# Patient Record
Sex: Female | Born: 1940
Health system: Southern US, Community
[De-identification: ages and names within clinical notes are randomized; demographics above are authoritative.]

## PROBLEM LIST (undated history)

## (undated) DIAGNOSIS — C50919 Malignant neoplasm of unspecified site of unspecified female breast: Secondary | ICD-10-CM

## (undated) DIAGNOSIS — E079 Disorder of thyroid, unspecified: Secondary | ICD-10-CM

## (undated) DIAGNOSIS — D0510 Intraductal carcinoma in situ of unspecified breast: Principal | ICD-10-CM

## (undated) DIAGNOSIS — M109 Gout, unspecified: Secondary | ICD-10-CM

## (undated) DIAGNOSIS — N814 Uterovaginal prolapse, unspecified: Secondary | ICD-10-CM

## (undated) DIAGNOSIS — K219 Gastro-esophageal reflux disease without esophagitis: Secondary | ICD-10-CM

## (undated) DIAGNOSIS — I10 Essential (primary) hypertension: Secondary | ICD-10-CM

## (undated) DIAGNOSIS — Z9889 Other specified postprocedural states: Secondary | ICD-10-CM

## (undated) DIAGNOSIS — R011 Cardiac murmur, unspecified: Secondary | ICD-10-CM

## (undated) DIAGNOSIS — J45909 Unspecified asthma, uncomplicated: Secondary | ICD-10-CM

## (undated) DIAGNOSIS — N189 Chronic kidney disease, unspecified: Secondary | ICD-10-CM

## (undated) DIAGNOSIS — R112 Nausea with vomiting, unspecified: Secondary | ICD-10-CM

## (undated) HISTORY — DX: Malignant neoplasm of unspecified site of unspecified female breast: C50.919

## (undated) HISTORY — PX: WRIST SURGERY: SHX841

## (undated) HISTORY — DX: Intraductal carcinoma in situ of unspecified breast: D05.10

## (undated) HISTORY — PX: KNEE SURGERY: SHX244

## (undated) HISTORY — DX: Essential (primary) hypertension: I10

## (undated) HISTORY — DX: Chronic kidney disease, unspecified: N18.9

## (undated) HISTORY — DX: Disorder of thyroid, unspecified: E07.9

## (undated) HISTORY — DX: Gastro-esophageal reflux disease without esophagitis: K21.9

## (undated) HISTORY — DX: Uterovaginal prolapse, unspecified: N81.4

## (undated) HISTORY — PX: HIP SURGERY: SHX245

## (undated) HISTORY — DX: Gout, unspecified: M10.9

## (undated) HISTORY — DX: Cardiac murmur, unspecified: R01.1

---

## 2000-12-31 ENCOUNTER — Encounter: Payer: Self-pay | Admitting: Orthopedic Surgery

## 2001-01-02 ENCOUNTER — Inpatient Hospital Stay (HOSPITAL_COMMUNITY): Admission: EM | Admit: 2001-01-02 | Discharge: 2001-01-03 | Payer: Self-pay | Admitting: Orthopedic Surgery

## 2003-07-13 ENCOUNTER — Other Ambulatory Visit: Admission: RE | Admit: 2003-07-13 | Discharge: 2003-07-13 | Payer: Self-pay | Admitting: Family Medicine

## 2004-01-19 ENCOUNTER — Emergency Department (HOSPITAL_COMMUNITY): Admission: EM | Admit: 2004-01-19 | Discharge: 2004-01-20 | Payer: Self-pay | Admitting: *Deleted

## 2004-01-20 ENCOUNTER — Ambulatory Visit (HOSPITAL_COMMUNITY): Admission: RE | Admit: 2004-01-20 | Discharge: 2004-01-20 | Payer: Self-pay | Admitting: Family Medicine

## 2004-10-12 ENCOUNTER — Other Ambulatory Visit: Admission: RE | Admit: 2004-10-12 | Discharge: 2004-10-12 | Payer: Self-pay | Admitting: Obstetrics and Gynecology

## 2010-05-31 ENCOUNTER — Encounter: Admission: RE | Admit: 2010-05-31 | Discharge: 2010-05-31 | Payer: Self-pay | Admitting: Obstetrics and Gynecology

## 2010-06-12 ENCOUNTER — Encounter: Admission: RE | Admit: 2010-06-12 | Discharge: 2010-06-12 | Payer: Self-pay | Admitting: Obstetrics and Gynecology

## 2010-06-20 ENCOUNTER — Encounter: Admission: RE | Admit: 2010-06-20 | Discharge: 2010-06-20 | Payer: Self-pay | Admitting: Obstetrics and Gynecology

## 2010-06-23 ENCOUNTER — Encounter: Admission: RE | Admit: 2010-06-23 | Discharge: 2010-06-23 | Payer: Self-pay | Admitting: Obstetrics and Gynecology

## 2010-06-30 ENCOUNTER — Ambulatory Visit (HOSPITAL_BASED_OUTPATIENT_CLINIC_OR_DEPARTMENT_OTHER): Payer: MEDICARE | Admitting: Genetic Counselor

## 2010-07-19 ENCOUNTER — Encounter (INDEPENDENT_AMBULATORY_CARE_PROVIDER_SITE_OTHER): Payer: Self-pay | Admitting: General Surgery

## 2010-07-19 ENCOUNTER — Ambulatory Visit (HOSPITAL_COMMUNITY)
Admission: RE | Admit: 2010-07-19 | Discharge: 2010-07-21 | Payer: Self-pay | Source: Home / Self Care | Attending: General Surgery | Admitting: General Surgery

## 2010-07-19 HISTORY — PX: OTHER SURGICAL HISTORY: SHX169

## 2010-08-28 ENCOUNTER — Encounter: Payer: MEDICARE | Admitting: Oncology

## 2010-08-28 DIAGNOSIS — D0590 Unspecified type of carcinoma in situ of unspecified breast: Secondary | ICD-10-CM

## 2010-10-02 LAB — DIFFERENTIAL
Basophils Absolute: 0 10*3/uL (ref 0.0–0.1)
Basophils Relative: 1 % (ref 0–1)
Eosinophils Absolute: 0.1 10*3/uL (ref 0.0–0.7)
Monocytes Absolute: 0.6 10*3/uL (ref 0.1–1.0)
Neutro Abs: 2.6 10*3/uL (ref 1.7–7.7)
Neutrophils Relative %: 46 % (ref 43–77)

## 2010-10-02 LAB — COMPREHENSIVE METABOLIC PANEL
ALT: 21 U/L (ref 0–35)
Albumin: 4.2 g/dL (ref 3.5–5.2)
Alkaline Phosphatase: 103 U/L (ref 39–117)
BUN: 21 mg/dL (ref 6–23)
Chloride: 108 mEq/L (ref 96–112)
Glucose, Bld: 89 mg/dL (ref 70–99)
Potassium: 4.8 mEq/L (ref 3.5–5.1)
Sodium: 142 mEq/L (ref 135–145)
Total Bilirubin: 0.8 mg/dL (ref 0.3–1.2)

## 2010-10-02 LAB — CBC
HCT: 34.9 % — ABNORMAL LOW (ref 36.0–46.0)
MCH: 29.5 pg (ref 26.0–34.0)
MCHC: 32.2 g/dL (ref 30.0–36.0)
MCV: 89.9 fL (ref 78.0–100.0)
MCV: 91.6 fL (ref 78.0–100.0)
Platelets: 212 10*3/uL (ref 150–400)
RBC: 2.98 MIL/uL — ABNORMAL LOW (ref 3.87–5.11)
RBC: 3.88 MIL/uL (ref 3.87–5.11)
RDW: 13.9 % (ref 11.5–15.5)
WBC: 5.7 10*3/uL (ref 4.0–10.5)

## 2010-10-02 LAB — BASIC METABOLIC PANEL
BUN: 20 mg/dL (ref 6–23)
CO2: 26 mEq/L (ref 19–32)
Calcium: 8.1 mg/dL — ABNORMAL LOW (ref 8.4–10.5)
Chloride: 106 mEq/L (ref 96–112)
Creatinine, Ser: 0.99 mg/dL (ref 0.4–1.2)
GFR calc Af Amer: >60 mL/min (ref >60)

## 2010-10-02 LAB — GLUCOSE, CAPILLARY: Glucose-Capillary: 97 mg/dL (ref 70–99)

## 2010-12-08 NOTE — Discharge Summary (Signed)
[facility]  Patient:    Christie Bruce, Christie Bruce                        MRN: [id number] Adm. Date:  [id number] Disc. Date: 01/03/01 Attending:  Dominica Severin Dictator:   Irena Cords, P.A.-C.                           Discharge Summary  PRIMARY DIAGNOSES:  Left radius fracture.  SECONDARY DIAGNOSES:  Urinary retention.  PROCEDURE:  Open reduction internal fixation left radius fracture with plate and screws by Dr. Amanda Pea with the assistance of [facility], P.A.-C. and Dorie Rank, P.A. on December 31, 2000.  Please see operative summary for further details.  CONSULTATIONS:  None.  LABORATORIES:  Preoperative:  Comprehensive metabolic panel was essentially all within normal limits with just a mild elevation of her total bilirubin to 1.3.  PT was 12.6, INR 1.0, PTT 28.  CBC was all within normal limits with just a mildly decreased hematocrit of 35.9.  White count was 5.0 and hemoglobin was 12.0 with platelet count 261,000.  Normal differential.  EKG showed normal sinus rhythm but it could not rule out anterior infarct. This was an unconfirmed EKG.  CHIEF COMPLAINT:  Left wrist pain.  HISTORY OF PRESENT ILLNESS:  Christie Bruce is a 70 year old female who sustained an injury to her left wrist.  Due to the position and nature of the fracture it was recommended she undergo ORIF of that wrist.  She was admitted on June 11 for surgical intervention.  HOSPITAL COURSE:  Following the surgical procedure patient was taken to the PACU in stable condition, transferred to the orthopedic floor in good condition.  She did well during her hospital stay.  The splint remained clean, dry, and intact throughout her hospital stay.  She had intact motor and sensory examinations and good movement of her fingers.  A capillary refill was less than 2 seconds at all times.  Occupational therapy was consulted for ADLs.  Patient received three doses of Ancef 1 g IV postoperative.  She  was on PCA morphine initially for pain management and then p.o. Percocet which controlled her pain well.  Drain was discontinued on postoperative day #1 without difficulty.  She did have problems with urinary retention on the first postoperative day.  Because of this her discharge was held.  Patient was started on Ditropan and a Foley was initially placed.  On postoperative day #2 she again had problems with urinary retention.  She was started on Urecholine for the retention.  Discharge was held that day as well because of continued urinary retention.  In the night of postoperative day #2 she began to urinate on her own after taking the Urecholine and by postoperative day #3 was urinating without difficulties.  She had no dysuria at all.  She was able to void without problems and was stable and ready for discharge home on postoperative day #3.  PLAN:  She will be discharged home.  She is to follow-up with Dr. Amanda Pea in 10 days.  She is to call [phone] for an appointment.  She is to resume a regular diet.  It is okay and I encouraged her to move her fingers.  No lifting, pushing, or pulling with that arm.  Instructed on ice and elevation.  Use the sling on the left arm.  DISCHARGE MEDICATIONS: 1. Percocet 5/325 one to two p.o. q.4-6h.  p.r.n. pain. 2. Robaxin 500 mg p.o. q.8h. p.r.n. spasm.  CONDITION ON DISCHARGE:  Good and improved. DD:  01/03/01 TD:  01/03/01 Job: [id number]

## 2010-12-08 NOTE — Op Note (Signed)
[facility]  Patient:    Christie Bruce, Christie Bruce                        MRN: [id number] Proc. Date: 12/31/00 Adm. Date:  [id number] Disc. Date: [id number] Attending:  Dominica Severin                           Operative Report  PREOPERATIVE DIAGNOSIS:  Left comminuted displaced radius fracture.  POSTOPERATIVE DIAGNOSIS:  Left comminuted displaced radius fracture.  PROCEDURE:  Open reduction, internal fixation, left radius fracture with volar plate and screws of a fixed-angled hand innovations volar plate construct.  SURGEON:  Elisha Ponder, M.D.  ASSISTANTJill Side P. Mahar, P.A.-C. and Dorie Rank, P.A.-C.  ANESTHESIA:  General.  ESTIMATED BLOOD LOSS:  Less than 100 cc.  COMPLICATIONS:  None.  DRAINS:  One.  INDICATIONS FOR PROCEDURE:  This patient is a very pleasant 70 year old white female, who presents with a left displaced distal radius fracture.  The patient has been counseled in regards to risks and benefits of surgery including risk of infection, bleeding, anesthesia, damage to normal structures, and the failure of the surgery to accomplish its intended goals of of relieving symptoms and restoring function.  With this in mind, she desires to proceed.  All questions have been encouraged and answered preoperatively.  OPERATIVE FINDINGS:  This patient had a displaced distal radius fracture. Reduction and fixation was obtained without difficulty.  I was very pleased with her motion after fixation and the secure fixation obtained at the time of the operation.  OPERATION IN DETAIL:  The patient was seen by myself and anesthesia.  She was taken to the operative suite and underwent a smooth induction of general anesthetic.  Once this was done, the left upper extremity was prepped and draped in the usual sterile fashion.  Following prepping and draping of the left upper extremity, the arm was elevated, the tourniquet was insufflated to 250 mmHg.  An  incision was made on the volar aspect of the wrist.  Dissection was carried down through skin with knife blade.  Following this, blunt and sharp dissection was carried down to the FCR tendon sheath which was split dorsally and palmarly.  The FCR was retracted in a radial direction.  The flexor contents were retracted ulnarly.  The radial artery was protected at all times.  Once this was done, the pronator quadratus was identified and elevated off of the radius in a radial to ulnar direction.  This exposed the fracture site.  The fracture site was reduced and preliminarily stabilized with Kirschner wire.  Following this, the patient had x-rays taken and adjustments made appropriately and following this, application of a fixed-angle hand innovation volar plate was applied.  Adjustments were made to correct for radial inclination, volar tilt, and radial height.  I was pleased with the ______  of bone and radiographic parameters.  Once this was done, the patient then had all screws applied without difficulty utilizing standard AO technique.  Once all screws were placed, provisional fixation was removed. The patient was taken through a range of motion and noted to be stable and without any significant metaphyseal void that would require a large amount of bone grafting.  Thus, at this point in time, the tourniquet was deflated, hemostasis obtained.  The pronator quadratus was reattached with interrupted Vicryl sutures.  Intraoperative x-rays were taken as well as fluoroscopy and  saved for permanent documentation.  Following closure of the pronator quadratus, a small TLO string was placed followed by closure of the wound with interrupted Prolene suture.  She tolerated this well without difficulty.  The patient had a sterile dressing applied and a sugar-tong splint placed.  She tolerated this well.  She was awakened from anesthesia in the operative suite and transferred to the recovery room in  stable condition.  All sponge, needle, and instrument counts were reported as correct, and there were no immediate intraoperative complications.  The patient will be monitored with IV antibiotics, pain medicine appropriate to her needs, IV fluids, management, etc.  The patient was monitored in the recovery room and was noted to be awake, alert, oriented, and in stable condition without neurovascular compromise.  We will look forward to participating in her care and her postop recovery. DD:  02/01/01 TD:  02/01/01 Job: [id number]

## 2010-12-08 NOTE — H&P (Signed)
[facility]  Patient:    Christie Bruce, Christie Bruce                        MRN: [id number] Adm. Date:  [id number] Disc. Date: [id number] Attending:  Dominica Severin Dictator:   Dorie Rank, P.A.                         History and Physical  CHIEF COMPLAINT:  Left arm pain.  HISTORY OF PRESENT ILLNESS:  Ms. Callins is a 70 year old female who sustained an injury to her left wrist.  Due to the position and nature of the fracture it was recommended she undergo ORIF of that wrist.  PAST MEDICAL HISTORY:  History of benign breast lump in the distant past.  Has a history of a hiatal hernia.  Previous history of kidney stones.  Arthritis. She wears glasses.  She has a previous history of skin cancer with no sequela.  PAST SURGICAL HISTORY:  Removal of benign breast lump (unsure of the date).  MEDICATIONS:  Advil two p.o. q.d. p.r.n.  ALLERGIES:  CELEBREX causes edema to the lips, DAYPRO causes edema to the lips.  SOCIAL HISTORY:  The patient is married.  She lives in a one-level home with her husband.  She is a nondrinker.  She smokes about a half a pack per day.  FAMILY HISTORY:  Noncontributory.  REVIEW OF SYSTEMS:  GENERAL:  No fevers, chills, night sweats, or bleeding tendencies.  CNS:  No blurred or double vision, seizures, headache, or paralysis.  RESPIRATORY:  No shortness of breath, productive cough, or hemoptysis.  CARDIOVASCULAR:  No chest pain, angina, or orthopnea. GASTROINTESTINAL:  No nausea, vomiting, diarrhea, constipation, melena, or blood stools.  GENITOURINARY:  No dysuria, hematuria, discharge. MUSCULOSKELETAL:  Left wrist pain as described above in the history of present illness.  PHYSICAL EXAMINATION  GENERAL:  A 70 year old well-developed, well-nourished female.  Alert and oriented x 3.  VITAL SIGNS:  Temperature 98.3, blood pressure 177/90, pulse 80, respirations 20.  NECK:  Supple.  Negative for carotid bruits bilaterally.  HEENT:  Head  is atraumatic, normocephalic.  Oropharynx is clear.  HEART:  S1, S2.  Negative for murmur, rub, or gallop.  Heart is regular in rhythm.  LUNGS:  Clear to auscultation bilaterally.  No wheezes, rhonchi, or rales.  ABDOMEN:  Soft, nontender, positive bowel sounds.  GENITOURINARY:  Not pertinent to present illness.  BREASTS:  Not pertinent to present illness.  EXTREMITIES:  Left arm is currently in a cast.  Capillary refill is less than two seconds.  Sensation and motor intact on the left hand.  Pain with movement.  NEUROLOGIC:  EOMs intact.  PERRLA.  IMPRESSION: 1. Left wrist fracture. 2. History of kidney stones. 3. History of gastroesophageal reflux disease. 4. Osteoarthritis.  PLAN:  The patient will be admitted to [facility] to undergo open reduction internal fixation of the left wrist by Dr. Onalee Hua. DD:  01/21/01 TD:  01/21/01 Job: [id number]

## 2011-08-11 ENCOUNTER — Telehealth: Payer: Self-pay | Admitting: Oncology

## 2011-08-11 NOTE — Telephone Encounter (Signed)
spoke with pt and she is aware of 2/20 appt  aom/conversion issue

## 2011-09-12 ENCOUNTER — Ambulatory Visit (HOSPITAL_BASED_OUTPATIENT_CLINIC_OR_DEPARTMENT_OTHER): Payer: Medicare Other | Admitting: Oncology

## 2011-09-12 ENCOUNTER — Other Ambulatory Visit: Payer: Medicare Other | Admitting: Lab

## 2011-09-12 ENCOUNTER — Encounter: Payer: Self-pay | Admitting: Oncology

## 2011-09-12 ENCOUNTER — Telehealth: Payer: Self-pay | Admitting: *Deleted

## 2011-09-12 DIAGNOSIS — D0590 Unspecified type of carcinoma in situ of unspecified breast: Secondary | ICD-10-CM

## 2011-09-12 DIAGNOSIS — K219 Gastro-esophageal reflux disease without esophagitis: Secondary | ICD-10-CM

## 2011-09-12 DIAGNOSIS — D0510 Intraductal carcinoma in situ of unspecified breast: Secondary | ICD-10-CM

## 2011-09-12 HISTORY — DX: Intraductal carcinoma in situ of unspecified breast: D05.10

## 2011-09-12 HISTORY — DX: Gastro-esophageal reflux disease without esophagitis: K21.9

## 2011-09-12 LAB — COMPREHENSIVE METABOLIC PANEL
AST: 27 U/L (ref 0–37)
Albumin: 4.5 g/dL (ref 3.5–5.2)
Alkaline Phosphatase: 118 U/L — ABNORMAL HIGH (ref 39–117)
Glucose, Bld: 94 mg/dL (ref 70–99)
Potassium: 4.7 mEq/L (ref 3.5–5.3)
Sodium: 143 mEq/L (ref 135–145)
Total Bilirubin: 0.6 mg/dL (ref 0.3–1.2)
Total Protein: 7.1 g/dL (ref 6.0–8.3)

## 2011-09-12 LAB — CBC WITH DIFFERENTIAL/PLATELET
BASO%: 0.6 % (ref 0.0–2.0)
EOS%: 2.9 % (ref 0.0–7.0)
Eosinophils Absolute: 0.2 10*3/uL (ref 0.0–0.5)
LYMPH%: 36.2 % (ref 14.0–49.7)
MCH: 29.4 pg (ref 25.1–34.0)
MCHC: 33.7 g/dL (ref 31.5–36.0)
MCV: 87.2 fL (ref 79.5–101.0)
MONO%: 9.1 % (ref 0.0–14.0)
NEUT#: 2.8 10*3/uL (ref 1.5–6.5)
Platelets: 253 10*3/uL (ref 145–400)
RBC: 3.69 10*6/uL — ABNORMAL LOW (ref 3.70–5.45)
RDW: 13.8 % (ref 11.2–14.5)

## 2011-09-12 NOTE — Telephone Encounter (Signed)
gave patient appointment for one year with nancy rudloph in one year for an hour of [facility]

## 2011-09-13 NOTE — Progress Notes (Signed)
OFFICE PROGRESS NOTE  CC Dr. Hinton Dyer Dr. 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  PRIOR THERAPY:  #1 patient underwent bilateral mastectomies. Final pathology showed ductal carcinoma in situ.   CURRENT THERAPY:Observation  INTERVAL HISTORY: Christie Bruce 71 y.o. female returns for Followup visit today. Overall she is doing well she is without any significant complaints. She is quite happy with her results. She has no evidence of recurrent disease.  MEDICAL HISTORY: Past Medical History  Diagnosis Date  . Breast cancer   . Hypertension   . GERD (gastroesophageal reflux disease)   . Uterine prolapse   . 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 GERD (gastroesophageal reflux disease) 09/12/2011    ALLERGIES:  is allergic to celebrex and sulfa antibiotics.  MEDICATIONS:  Current Outpatient Prescriptions  Medication Sig Dispense Refill  . ALPRAZolam (XANAX) 0.25 MG tablet Take 0.25 mg by mouth at bedtime as needed.      Marland Kitchen ibuprofen (ADVIL,MOTRIN) 200 MG tablet Take 200 mg by mouth every 6 (six) hours as needed.      Marland Kitchen lisinopril-hydrochlorothiazide (PRINZIDE,ZESTORETIC) 20-25 MG per tablet Take 1 tablet by mouth daily.        SURGICAL HISTORY:  Past Surgical History  Procedure Date  . Bilateral mastectomy 07/19/10    bilat mastectomy for DCIS    REVIEW OF SYSTEMS:  A comprehensive review of systems was negative.   PHYSICAL EXAMINATION: General appearance: alert, cooperative and appears stated age Lymph nodes: Cervical, supraclavicular, and axillary nodes normal. Resp: clear to auscultation bilaterally and normal percussion bilaterally Back: symmetric, no curvature. ROM normal. No CVA tenderness. Cardio: regular rate and rhythm, S1, S2 normal, no  murmur, click, rub or gallop GI: soft, non-tender; bowel sounds normal; no masses,  no organomegaly Extremities: extremities normal, atraumatic, no cyanosis or edema Neurologic: Grossly normal  ECOG PERFORMANCE STATUS: 1 - Symptomatic but completely ambulatory  Blood pressure 151/76, pulse 89, temperature 99.1 F (37.3 C), temperature source Oral, height 5\' 1"  (1.549 m), weight 160 lb 12.8 oz (72.938 kg).  LABORATORY DATA: Lab Results  Component Value Date   WBC 5.5 09/12/2011   HGB 10.8* 09/12/2011   HCT 32.2* 09/12/2011   MCV 87.2 09/12/2011   PLT 253 09/12/2011      Chemistry      Component Value Date/Time   NA 143 09/12/2011 1442   K 4.7 09/12/2011 1442   CL 109 09/12/2011 1442   CO2 25 09/12/2011 1442   BUN 24* 09/12/2011 1442   CREATININE 1.07 09/12/2011 1442      Component Value Date/Time   CALCIUM 9.2 09/12/2011 1442   ALKPHOS 118* 09/12/2011 1442   AST 27 09/12/2011 1442   ALT 18 09/12/2011 1442   BILITOT 0.6 09/12/2011 1442       RADIOGRAPHIC STUDIES:  No results found.  ASSESSMENT: 71 year old female with bilateral ductal carcinoma in situ status post bilateral simple mastectomies. She is without clinical evidence of recurrent disease. Overall she is doing well.   PLAN: Plan is to continue to follow the patient on a yearly basis for the first 5 years.   All questions were answered. The patient knows to call the clinic with any problems, questions or concerns. We can certainly see the patient much sooner if necessary.  I spent 20 minutes counseling the patient  face to face. The total time spent in the appointment was 25 minutes.    Drue Second, MD Medical/Oncology [facility] [phone] (beeper) [phone] (Office)  09/13/2011, 6:35 PM

## 2012-09-01 ENCOUNTER — Other Ambulatory Visit: Payer: Self-pay | Admitting: Obstetrics and Gynecology

## 2012-09-01 ENCOUNTER — Other Ambulatory Visit (HOSPITAL_COMMUNITY)
Admission: RE | Admit: 2012-09-01 | Discharge: 2012-09-01 | Disposition: A | Discharge status: Home / Self Care | Payer: Medicare Other | Source: Ambulatory Visit | Attending: Obstetrics and Gynecology | Admitting: Obstetrics and Gynecology

## 2012-09-01 DIAGNOSIS — Z1151 Encounter for screening for human papillomavirus (HPV): Secondary | ICD-10-CM | POA: Insufficient documentation

## 2012-09-01 DIAGNOSIS — Z01419 Encounter for gynecological examination (general) (routine) without abnormal findings: Secondary | ICD-10-CM | POA: Insufficient documentation

## 2012-09-01 DIAGNOSIS — Z113 Encounter for screening for infections with a predominantly sexual mode of transmission: Secondary | ICD-10-CM | POA: Insufficient documentation

## 2012-09-11 ENCOUNTER — Ambulatory Visit: Payer: Medicare Other | Admitting: Oncology

## 2012-10-09 ENCOUNTER — Telehealth: Payer: Self-pay | Admitting: *Deleted

## 2012-10-09 NOTE — Telephone Encounter (Signed)
Pt's contact number is [phone]

## 2012-10-09 NOTE — Telephone Encounter (Signed)
Dr. Despina Hidden ordered a pessary. Pt took Rx to [facility]. Pt went to pick up pessary and it wasn't there. Spoke with Federico Flake, Dr. Forestine Chute nurse, and pessary will be ordered through hosp. Pt will be contacted when Pessary comes in and an appt will be scheduled for pt to get it placed.

## 2012-11-20 ENCOUNTER — Telehealth: Payer: Self-pay | Admitting: *Deleted

## 2012-11-20 NOTE — Telephone Encounter (Signed)
Spoke with pt daughter. She was asking if pessary had come in. Dr. Despina Hidden ordered pessary, and I'm not sure if one of those is hers(there is 3 in cabinet). Will speak to [facility] tomorrow and find out and let pt know.

## 2012-11-21 NOTE — Telephone Encounter (Signed)
Spoke with Dr. Despina Hidden. He advised pt to schedule an appt in 1 week. Called pt and left message to call office and schedule appt.

## 2012-11-28 ENCOUNTER — Encounter: Payer: Self-pay | Admitting: *Deleted

## 2012-12-01 ENCOUNTER — Ambulatory Visit (INDEPENDENT_AMBULATORY_CARE_PROVIDER_SITE_OTHER): Payer: Medicare Other | Admitting: Obstetrics & Gynecology

## 2012-12-01 ENCOUNTER — Encounter: Payer: Self-pay | Admitting: Obstetrics & Gynecology

## 2012-12-01 VITALS — BP 120/70 | Ht 62.0 in | Wt 154.0 lb

## 2012-12-01 DIAGNOSIS — N813 Complete uterovaginal prolapse: Secondary | ICD-10-CM

## 2012-12-01 NOTE — Progress Notes (Signed)
Patient ID: Christie Bruce, female   DOB: 11/19/1940, 72 y.o.   MRN: [id number] Previously fitted for a gelhorn 2 1/4 inch  Placed today without difficulty  Pt has complete procidentia  Follow up 1 month Prn earlier

## 2012-12-01 NOTE — Addendum Note (Signed)
Addended by: Lazaro Arms on: 12/01/2012 03:10 PM   Modules accepted: Level of Service

## 2012-12-29 ENCOUNTER — Ambulatory Visit (INDEPENDENT_AMBULATORY_CARE_PROVIDER_SITE_OTHER): Payer: Medicare Other | Admitting: Obstetrics & Gynecology

## 2012-12-29 ENCOUNTER — Encounter: Payer: Self-pay | Admitting: Obstetrics & Gynecology

## 2012-12-29 VITALS — BP 132/70 | Wt 154.0 lb

## 2012-12-29 DIAGNOSIS — N813 Complete uterovaginal prolapse: Secondary | ICD-10-CM

## 2012-12-29 NOTE — Progress Notes (Signed)
Patient ID: Christie Bruce, female   DOB: 05/08/1941, 72 y.o.   MRN: [id number] No complaints No discharge No bleeding  Exam Vaginal mucosa is healthy with no evidence of breakdown  Follow up in 3 months

## 2013-02-02 ENCOUNTER — Other Ambulatory Visit: Payer: Self-pay | Admitting: *Deleted

## 2013-02-02 MED ORDER — LISINOPRIL-HYDROCHLOROTHIAZIDE 20-25 MG PO TABS: 1.0000 | ORAL_TABLET | Freq: Every day | ORAL | Status: DC

## 2013-02-02 NOTE — Telephone Encounter (Signed)
LAST OV 12/13. SEE ALLERGIES

## 2013-03-05 ENCOUNTER — Telehealth: Payer: Self-pay | Admitting: Hematology & Oncology

## 2013-03-05 ENCOUNTER — Telehealth: Payer: Self-pay | Admitting: Oncology

## 2013-03-05 NOTE — Telephone Encounter (Signed)
Talked to pt and gave her appt for October , r/s appt from February 2014

## 2013-03-05 NOTE — Telephone Encounter (Signed)
Pt called wanting appointment. Her call was transferred from the CC number. She is a pt of Dr. Welton Flakes and had tried to reschedule missed feb appointment but no one called her back. Pt called yesterday left message but no return call. Seward Grater is aware and per her the pt is aware to call on Friday and ask for Enrique Sack or Dewayne Hatch in the breast center scheduling. I sent e-mail to [facility] as requested. Pt is also aware to call me back 8-15 in the afternoon if she doesn't here back from them.

## 2013-03-09 ENCOUNTER — Other Ambulatory Visit: Payer: Self-pay | Admitting: *Deleted

## 2013-03-09 MED ORDER — LISINOPRIL-HYDROCHLOROTHIAZIDE 20-25 MG PO TABS: 1.0000 | ORAL_TABLET | Freq: Every day | ORAL | Status: DC

## 2013-03-31 ENCOUNTER — Ambulatory Visit (INDEPENDENT_AMBULATORY_CARE_PROVIDER_SITE_OTHER): Payer: Medicare Other | Admitting: Obstetrics & Gynecology

## 2013-03-31 ENCOUNTER — Encounter: Payer: Self-pay | Admitting: Obstetrics & Gynecology

## 2013-03-31 VITALS — BP 120/64 | Ht 62.0 in | Wt 150.0 lb

## 2013-03-31 DIAGNOSIS — N813 Complete uterovaginal prolapse: Secondary | ICD-10-CM

## 2013-03-31 MED ORDER — ALPRAZOLAM 0.25 MG PO TABS: 0.2500 mg | ORAL_TABLET | Freq: Every evening | ORAL | Status: DC | PRN

## 2013-03-31 NOTE — Progress Notes (Signed)
Patient ID: Christie Bruce, female   DOB: 08/13/1940, 72 y.o.   MRN: [id number] The patient has a Gelhorn pessary in place 2-1/4 inches It is been in place now for 4 months She is without complaints no abnormal vaginal discharge or bleeding She is having some loss of urine which is not surprising given the fact that we taking the kink out of her anterior compartment  But overall patient has no specific complaints regarding pessary  On exam there is no breakdown of the vaginal mucosa around the rim of the pessary There is no abnormal discharge or foul smell The leading knob of the pessary is close to the introitus She does have a relatively small vaginal aperture which makes it more challenging to come up with a suitable pessary support  We will see Christie Bruce back in 4 months for followup and hopefully be able to extended out to 6 months in the future

## 2013-03-31 NOTE — Addendum Note (Signed)
Addended by: Lazaro Arms on: 03/31/2013 09:40 AM   Modules accepted: Orders

## 2013-04-24 ENCOUNTER — Ambulatory Visit (HOSPITAL_BASED_OUTPATIENT_CLINIC_OR_DEPARTMENT_OTHER): Payer: Medicare Other | Admitting: Oncology

## 2013-04-24 ENCOUNTER — Encounter: Payer: Self-pay | Admitting: Oncology

## 2013-04-24 ENCOUNTER — Telehealth: Payer: Self-pay | Admitting: *Deleted

## 2013-04-24 VITALS — BP 127/73 | HR 79 | Temp 98.8°F | Resp 20 | Ht 62.0 in | Wt 152.5 lb

## 2013-04-24 DIAGNOSIS — D0590 Unspecified type of carcinoma in situ of unspecified breast: Secondary | ICD-10-CM

## 2013-04-24 DIAGNOSIS — Z9010 Acquired absence of unspecified breast and nipple: Secondary | ICD-10-CM

## 2013-04-24 DIAGNOSIS — D0510 Intraductal carcinoma in situ of unspecified breast: Secondary | ICD-10-CM

## 2013-04-24 NOTE — Patient Instructions (Addendum)
Doing well  We will see you back in 1 year 

## 2013-04-24 NOTE — Progress Notes (Signed)
OFFICE PROGRESS NOTE  CC Dr. Hinton Dyer Dr. Alcario Drought, MD 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  PRIOR THERAPY:  #1 patient underwent bilateral mastectomies. Final pathology showed ductal carcinoma in situ.   CURRENT THERAPY:Observation  INTERVAL HISTORY: Christie Bruce 72 y.o. female returns for Followup visit today. Overall she is doing well she is without any significant complaints. She is quite happy with her results. She has no evidence of recurrent disease.  MEDICAL HISTORY: Past Medical History  Diagnosis Date  . Breast cancer   . Hypertension   . GERD (gastroesophageal reflux disease)   . Uterine prolapse   . 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 GERD (gastroesophageal reflux disease) 09/12/2011  . Heart murmur     ALLERGIES:  is allergic to celebrex and sulfa antibiotics.  MEDICATIONS:  Current Outpatient Prescriptions  Medication Sig Dispense Refill  . ALPRAZolam (XANAX) 0.25 MG tablet Take 1 tablet (0.25 mg total) by mouth at bedtime as needed.  60 tablet  0  . ibuprofen (ADVIL,MOTRIN) 200 MG tablet Take 200 mg by mouth every 6 (six) hours as needed.      Marland Kitchen lisinopril-hydrochlorothiazide (PRINZIDE,ZESTORETIC) 20-25 MG per tablet Take 1 tablet by mouth daily.  30 tablet  2   No current facility-administered medications for this visit.    SURGICAL HISTORY:  Past Surgical History  Procedure Laterality Date  . Bilateral mastectomy  07/19/10    bilat mastectomy for DCIS  . Knee surgery Left   . Wrist surgery Left     REVIEW OF SYSTEMS:  A comprehensive review of systems was negative.   PHYSICAL EXAMINATION: General appearance: alert, cooperative and appears stated age Lymph nodes: Cervical, supraclavicular, and axillary nodes normal. Resp: clear to auscultation bilaterally and normal  percussion bilaterally Back: symmetric, no curvature. ROM normal. No CVA tenderness. Cardio: regular rate and rhythm, S1, S2 normal, no murmur, click, rub or gallop GI: soft, non-tender; bowel sounds normal; no masses,  no organomegaly Extremities: extremities normal, atraumatic, no cyanosis or edema Neurologic: Grossly normal  ECOG PERFORMANCE STATUS: 1 - Symptomatic but completely ambulatory  Blood pressure 127/73, pulse 79, temperature 98.8 F (37.1 C), temperature source Oral, resp. rate 20, height 5\' 2"  (1.575 m), weight 152 lb 8 oz (69.174 kg).  LABORATORY DATA: Lab Results  Component Value Date   WBC 5.5 09/12/2011   HGB 10.8* 09/12/2011   HCT 32.2* 09/12/2011   MCV 87.2 09/12/2011   PLT 253 09/12/2011      Chemistry      Component Value Date/Time   NA 143 09/12/2011 1442   K 4.7 09/12/2011 1442   CL 109 09/12/2011 1442   CO2 25 09/12/2011 1442   BUN 24* 09/12/2011 1442   CREATININE 1.07 09/12/2011 1442      Component Value Date/Time   CALCIUM 9.2 09/12/2011 1442   ALKPHOS 118* 09/12/2011 1442   AST 27 09/12/2011 1442   ALT 18 09/12/2011 1442   BILITOT 0.6 09/12/2011 1442       RADIOGRAPHIC STUDIES:  No results found.  ASSESSMENT: 72 year old female with bilateral ductal carcinoma in situ status post bilateral simple mastectomies. She is without clinical evidence of recurrent disease. Overall she is doing well.   PLAN: Plan is to continue to follow the patient on a yearly basis for the first 5 years.   All questions were answered.  The patient knows to call the clinic with any problems, questions or concerns. We can certainly see the patient much sooner if necessary.  I spent counseling the patient face to face. The total time spent in the appointment was 15 minutes.    Drue Second, MD Medical/Oncology [facility] [phone] (beeper) [phone] (Office)  04/24/2013, 3:54 PM

## 2013-04-24 NOTE — Telephone Encounter (Signed)
appts made and printed...td 

## 2013-05-14 ENCOUNTER — Encounter (INDEPENDENT_AMBULATORY_CARE_PROVIDER_SITE_OTHER): Payer: Self-pay

## 2013-05-14 ENCOUNTER — Ambulatory Visit (INDEPENDENT_AMBULATORY_CARE_PROVIDER_SITE_OTHER): Payer: Medicare Other | Admitting: General Practice

## 2013-05-14 ENCOUNTER — Encounter: Payer: Self-pay | Admitting: General Practice

## 2013-05-14 VITALS — BP 136/75 | HR 67 | Temp 97.5°F | Ht 62.0 in | Wt 150.5 lb

## 2013-05-14 DIAGNOSIS — F411 Generalized anxiety disorder: Secondary | ICD-10-CM

## 2013-05-14 DIAGNOSIS — M129 Arthropathy, unspecified: Secondary | ICD-10-CM

## 2013-05-14 DIAGNOSIS — M1990 Unspecified osteoarthritis, unspecified site: Secondary | ICD-10-CM

## 2013-05-14 DIAGNOSIS — I10 Essential (primary) hypertension: Secondary | ICD-10-CM

## 2013-05-14 DIAGNOSIS — D649 Anemia, unspecified: Secondary | ICD-10-CM

## 2013-05-14 LAB — POCT CBC
Granulocyte percent: 47.4 %G (ref 37–80)
Hemoglobin: 10.9 g/dL — AB (ref 12.2–16.2)
Lymph, poc: 2.2 (ref 0.6–3.4)
MCHC: 32.9 g/dL (ref 31.8–35.4)
MPV: 7.5 fL (ref 0–99.8)
POC Granulocyte: 2.3 (ref 2–6.9)
POC LYMPH PERCENT: 45.8 %L (ref 10–50)
RDW, POC: 14.5 %

## 2013-05-14 MED ORDER — LISINOPRIL-HYDROCHLOROTHIAZIDE 20-25 MG PO TABS: 1.0000 | ORAL_TABLET | Freq: Every day | ORAL | Status: DC

## 2013-05-14 MED ORDER — MELOXICAM 7.5 MG PO TABS: 7.5000 mg | ORAL_TABLET | Freq: Every day | ORAL | Status: DC

## 2013-05-14 MED ORDER — ALPRAZOLAM 0.25 MG PO TABS: 0.2500 mg | ORAL_TABLET | Freq: Every evening | ORAL | Status: DC | PRN

## 2013-05-14 NOTE — Patient Instructions (Signed)

## 2013-05-14 NOTE — Progress Notes (Signed)
  Subjective:    Patient ID: Christie Bruce, female    DOB: 09-05-1940, 72 y.o.   MRN: [id number]  HPI Patient presents today for chronic health follow up. She has a history of hypertension and anxiety. She reports regular exercise by walking and low sodium. Reports left knee pain that is increasingly getting worse, knee surgery in 2012. Denies advil or tylenol being effective for pain relief.     Review of Systems  Constitutional: Negative for fever and chills.  Respiratory: Negative for chest tightness, shortness of breath and wheezing.   Cardiovascular: Negative for chest pain and palpitations.  Gastrointestinal: Negative for nausea, vomiting, abdominal pain, diarrhea, constipation and blood in stool.  Genitourinary: Negative for dysuria and difficulty urinating.  Musculoskeletal: Negative for back pain and neck pain.       Knee pain left worse than right  Neurological: Negative for dizziness, weakness and headaches.       Objective:   Physical Exam  Constitutional: She is oriented to person, place, and time. She appears well-developed and well-nourished.  HENT:  Head: Normocephalic and atraumatic.  Right Ear: External ear normal.  Left Ear: External ear normal.  Nose: Nose normal.  Mouth/Throat: Oropharynx is clear and moist.  Eyes: EOM are normal. Pupils are equal, round, and reactive to light.  Neck: Normal range of motion. Neck supple. No thyromegaly present.  Cardiovascular: Normal rate, regular rhythm and normal heart sounds.   Pulmonary/Chest: Effort normal and breath sounds normal. No respiratory distress. She exhibits no tenderness.  Abdominal: Soft. Bowel sounds are normal. She exhibits no distension. There is no tenderness.  Musculoskeletal: She exhibits no edema and no tenderness.  Limited range of motion bilateral knees  Lymphadenopathy:    She has no cervical adenopathy.  Neurological: She is alert and oriented to person, place, and time.  Skin: Skin is warm and  dry.  Psychiatric: She has a normal mood and affect.          Assessment & Plan:  1. Hypertension  - CMP14+EGFR - NMR, lipoprofile - lisinopril-hydrochlorothiazide (PRINZIDE,ZESTORETIC) 20-25 MG per tablet; Take 1 tablet by mouth daily.  Dispense: 30 tablet; Refill: 3  2. Low hemoglobin  - POCT CBC  3. Generalized anxiety disorder  - ALPRAZolam (XANAX) 0.25 MG tablet; Take 1 tablet (0.25 mg total) by mouth at bedtime as needed.  Dispense: 60 tablet; Refill: 0  4. Arthritis  - meloxicam (MOBIC) 7.5 MG tablet; Take 1 tablet (7.5 mg total) by mouth daily.  Dispense: 30 tablet; Refill: 0 -Patient will contact [facility] [place] [facility], who she is already being seen by if symptoms worsen or no relief -Continue all current medications Labs pending F/u in 3 months Discussed exercise and diet  Patient verbalized understanding Coralie Keens, FNP-C

## 2013-05-16 LAB — CMP14+EGFR
ALT: 11 IU/L (ref 0–32)
Albumin: 4.5 g/dL (ref 3.5–4.8)
BUN: 23 mg/dL (ref 8–27)
Calcium: 9.7 mg/dL (ref 8.6–10.2)
Chloride: 105 mmol/L (ref 97–108)
Glucose: 90 mg/dL (ref 65–99)
Potassium: 4.9 mmol/L (ref 3.5–5.2)
Total Protein: 7 g/dL (ref 6.0–8.5)

## 2013-05-16 LAB — NMR, LIPOPROFILE
HDL Cholesterol by NMR: 59 mg/dL (ref >=40)
LDL Particle Number: 1369 nmol/L — ABNORMAL HIGH (ref <1000)
LDLC SERPL CALC-MCNC: 106 mg/dL — ABNORMAL HIGH (ref <100)
Triglycerides by NMR: 87 mg/dL (ref <150)

## 2013-05-22 ENCOUNTER — Telehealth: Payer: Self-pay | Admitting: General Practice

## 2013-05-23 ENCOUNTER — Other Ambulatory Visit: Payer: Self-pay | Admitting: General Practice

## 2013-05-23 DIAGNOSIS — F411 Generalized anxiety disorder: Secondary | ICD-10-CM

## 2013-05-23 MED ORDER — ALPRAZOLAM 0.25 MG PO TABS: 0.2500 mg | ORAL_TABLET | Freq: Every evening | ORAL | Status: DC | PRN

## 2013-05-23 NOTE — Telephone Encounter (Signed)
Script ready for pick up. She should script with quantity of 30.

## 2013-05-23 NOTE — Telephone Encounter (Signed)
Looks like this was printed on 05/14/13.

## 2013-06-16 ENCOUNTER — Other Ambulatory Visit: Payer: Self-pay

## 2013-06-16 DIAGNOSIS — M1990 Unspecified osteoarthritis, unspecified site: Secondary | ICD-10-CM

## 2013-06-16 MED ORDER — MELOXICAM 7.5 MG PO TABS: 7.5000 mg | ORAL_TABLET | Freq: Every day | ORAL | Status: DC

## 2013-06-24 ENCOUNTER — Telehealth: Payer: Self-pay | Admitting: General Practice

## 2013-06-24 ENCOUNTER — Other Ambulatory Visit: Payer: Self-pay | Admitting: General Practice

## 2013-06-24 DIAGNOSIS — F411 Generalized anxiety disorder: Secondary | ICD-10-CM

## 2013-06-24 MED ORDER — ALPRAZOLAM 0.25 MG PO TABS: 0.2500 mg | ORAL_TABLET | Freq: Every evening | ORAL | Status: DC | PRN

## 2013-06-24 NOTE — Telephone Encounter (Signed)
Please inform script ready for pick up 

## 2013-06-24 NOTE — Telephone Encounter (Signed)
Script ready.

## 2013-07-13 ENCOUNTER — Other Ambulatory Visit: Payer: Self-pay

## 2013-07-13 DIAGNOSIS — M1990 Unspecified osteoarthritis, unspecified site: Secondary | ICD-10-CM

## 2013-07-13 MED ORDER — MELOXICAM 7.5 MG PO TABS: 7.5000 mg | ORAL_TABLET | Freq: Every day | ORAL | Status: DC

## 2013-07-22 ENCOUNTER — Telehealth: Payer: Self-pay | Admitting: General Practice

## 2013-07-22 NOTE — Telephone Encounter (Signed)
Patient advised to try mucinex otc for her cough since it just started and she hasnt tried anything otc and advised if it gets worse to please call us back and we will be glad to get her in on Friday do to [place] being closed tomorrow

## 2013-07-28 ENCOUNTER — Ambulatory Visit (INDEPENDENT_AMBULATORY_CARE_PROVIDER_SITE_OTHER): Payer: Medicare Other | Admitting: General Practice

## 2013-07-28 ENCOUNTER — Encounter: Payer: Self-pay | Admitting: General Practice

## 2013-07-28 VITALS — BP 142/73 | HR 79 | Temp 99.0°F | Ht 62.0 in | Wt 146.0 lb

## 2013-07-28 DIAGNOSIS — J019 Acute sinusitis, unspecified: Secondary | ICD-10-CM

## 2013-07-28 DIAGNOSIS — J209 Acute bronchitis, unspecified: Secondary | ICD-10-CM

## 2013-07-28 DIAGNOSIS — R509 Fever, unspecified: Secondary | ICD-10-CM

## 2013-07-28 DIAGNOSIS — R062 Wheezing: Secondary | ICD-10-CM

## 2013-07-28 LAB — POCT INFLUENZA A/B
INFLUENZA A, POC: NEGATIVE
Influenza B, POC: NEGATIVE

## 2013-07-28 LAB — POCT RAPID STREP A (OFFICE): Rapid Strep A Screen: NEGATIVE

## 2013-07-28 MED ORDER — ALBUTEROL SULFATE HFA 108 (90 BASE) MCG/ACT IN AERS: 2.0000 | INHALATION_SPRAY | Freq: Four times a day (QID) | RESPIRATORY_TRACT | Status: DC | PRN

## 2013-07-28 MED ORDER — PREDNISONE (PAK) 10 MG PO TABS: ORAL_TABLET | ORAL | Status: DC

## 2013-07-28 MED ORDER — AZITHROMYCIN 250 MG PO TABS: ORAL_TABLET | ORAL | Status: DC

## 2013-07-28 NOTE — Progress Notes (Signed)
   Subjective:    Patient ID: Christie Bruce, female    DOB: [birth year], 73 y.o.   MRN: [id number]  Fever  This is a new problem. The current episode started yesterday. The problem occurs daily. The problem has been unchanged. Temperature source: unmeasured. Associated symptoms include congestion, coughing, diarrhea and wheezing. Pertinent negatives include no abdominal pain, chest pain, nausea, sore throat or vomiting. She has tried nothing for the symptoms.  Cough This is a new problem. The current episode started in the past 7 days. The problem has been unchanged. The cough is non-productive. Associated symptoms include nasal congestion, postnasal drip and wheezing. Pertinent negatives include no chest pain, chills, fever, sore throat or shortness of breath. She has tried nothing for the symptoms. Her past medical history is significant for bronchitis. There is no history of asthma or pneumonia.      Review of Systems  Constitutional: Negative for fever and chills.  HENT: Positive for congestion, postnasal drip and sinus pressure. Negative for sore throat.   Respiratory: Positive for cough and wheezing. Negative for chest tightness and shortness of breath.   Cardiovascular: Negative for chest pain and palpitations.  Gastrointestinal: Positive for diarrhea. Negative for nausea, vomiting, abdominal pain, constipation and blood in stool.       3-4 bowel movements over past 3 days. No diarrhea toay  All other systems reviewed and are negative.       Objective:   Physical Exam  Constitutional: She is oriented to person, place, and time. She appears well-developed and well-nourished.  HENT:  Head: Normocephalic and atraumatic.  Right Ear: External ear normal.  Left Ear: External ear normal.  Cardiovascular: Normal rate, regular rhythm and normal heart sounds.   Pulmonary/Chest: Effort normal. She has wheezes in the right upper field and the left upper field.  Bronchial cough  Abdominal:  Soft. Bowel sounds are normal. She exhibits no distension. There is no tenderness.  Neurological: She is alert and oriented to person, place, and time.  Skin: Skin is warm and dry.  Psychiatric: She has a normal mood and affect.     Results for orders placed in visit on 07/28/13  POCT INFLUENZA A/B      Result Value Range   Influenza A, POC Negative     Influenza B, POC Negative    POCT RAPID STREP A (OFFICE)      Result Value Range   Rapid Strep A Screen Negative  Negative        Assessment & Plan:  1. Fever  - POCT Influenza A/B - POCT rapid strep A  2. Sinusitis, acute  - azithromycin (ZITHROMAX) 250 MG tablet; Take as directed  Dispense: 6 tablet; Refill: 0  3. Wheezing  - albuterol (PROAIR HFA) 108 (90 BASE) MCG/ACT inhaler; Inhale 2 puffs into the lungs every 6 (six) hours as needed for wheezing or shortness of breath.  Dispense: 1 Inhaler; Refill: 0  4. Acute bronchitis  - predniSONE (STERAPRED UNI-PAK) 10 MG tablet; Take as directed  Dispense: 21 tablet; Refill: 0 -adequate fluids -RTO if symptoms worsen or unresolved Patient verbalized understanding Erby Pian, FNP-C

## 2013-07-28 NOTE — Patient Instructions (Signed)

## 2013-07-31 ENCOUNTER — Encounter: Payer: Medicare Other | Admitting: Obstetrics & Gynecology

## 2013-08-04 ENCOUNTER — Encounter: Payer: Self-pay | Admitting: Obstetrics & Gynecology

## 2013-08-04 ENCOUNTER — Ambulatory Visit (INDEPENDENT_AMBULATORY_CARE_PROVIDER_SITE_OTHER): Payer: Medicare Other | Admitting: Obstetrics & Gynecology

## 2013-08-04 VITALS — BP 120/60 | Wt 148.0 lb

## 2013-08-04 DIAGNOSIS — N813 Complete uterovaginal prolapse: Secondary | ICD-10-CM

## 2013-08-04 NOTE — Progress Notes (Signed)
Patient ID: Christie Bruce, female   DOB: April 20, 1941, 73 y.o.   MRN: [id number] The patient has a Gelhorn pessary in place 2-1/4 inches  It is been in place now for 4 months  She is without complaints no abnormal vaginal discharge or bleeding  She is having some loss of urine which is not surprising given the fact that we taking the kink out of her anterior compartment  But overall patient has no specific complaints regarding pessary  On exam there is no breakdown of the vaginal mucosa around the rim of the pessary  There is no abnormal discharge or foul smell  The leading knob of the pessary is close to the introitus  She does have a relatively small vaginal aperture which makes it more challenging to come up with a suitable pessary support  We will see Ms. Furber back in 6 months for followup

## 2013-09-22 ENCOUNTER — Other Ambulatory Visit: Payer: Self-pay

## 2013-09-22 DIAGNOSIS — M1990 Unspecified osteoarthritis, unspecified site: Secondary | ICD-10-CM

## 2013-09-22 MED ORDER — MELOXICAM 7.5 MG PO TABS: 7.5000 mg | ORAL_TABLET | Freq: Every day | ORAL | Status: DC

## 2013-10-31 ENCOUNTER — Other Ambulatory Visit: Payer: Self-pay | Admitting: General Practice

## 2013-11-02 NOTE — Telephone Encounter (Signed)
Patient last seen in office on 07-28-13. Rx last filled on 09-25-13 for #[phone]. Please advise. If approved please route to pool B so nurse can phone in to pharmacy

## 2013-11-03 NOTE — Telephone Encounter (Signed)
Called in refill authorization  

## 2013-11-03 NOTE — Telephone Encounter (Signed)
Please phone in with no refills

## 2013-12-11 ENCOUNTER — Other Ambulatory Visit: Payer: Self-pay | Admitting: General Practice

## 2013-12-15 NOTE — Telephone Encounter (Signed)
Last seen 07/28/13  Christie Bruce

## 2013-12-21 ENCOUNTER — Telehealth: Payer: Self-pay | Admitting: Family Medicine

## 2013-12-22 ENCOUNTER — Ambulatory Visit (INDEPENDENT_AMBULATORY_CARE_PROVIDER_SITE_OTHER): Payer: Medicare Other | Admitting: Nurse Practitioner

## 2013-12-22 ENCOUNTER — Encounter: Payer: Self-pay | Admitting: Nurse Practitioner

## 2013-12-22 VITALS — BP 156/79 | HR 90 | Temp 98.7°F | Ht 62.0 in | Wt 156.0 lb

## 2013-12-22 DIAGNOSIS — J45909 Unspecified asthma, uncomplicated: Secondary | ICD-10-CM | POA: Insufficient documentation

## 2013-12-22 MED ORDER — PREDNISONE 20 MG PO TABS
ORAL_TABLET | ORAL | Status: DC
Start: 2013-12-22 — End: 2014-05-03

## 2013-12-22 MED ORDER — BENZONATATE 100 MG PO CAPS: 100.0000 mg | ORAL_CAPSULE | Freq: Two times a day (BID) | ORAL | Status: DC | PRN

## 2013-12-22 MED ORDER — ALPRAZOLAM 0.25 MG PO TABS: 0.2500 mg | ORAL_TABLET | Freq: Two times a day (BID) | ORAL | Status: DC | PRN

## 2013-12-22 MED ORDER — AZITHROMYCIN 250 MG PO TABS: ORAL_TABLET | ORAL | Status: DC

## 2013-12-22 NOTE — Progress Notes (Signed)
Subjective:    Patient ID: Christie Bruce, female    DOB: [birth year], 73 y.o.   MRN: [id number]  HPI Patient in today c/o cough and wheezing- tarted about 2 weeks ago- worse has gotten worse. She does have history of asthma- Hasn't been using albuterol inhaler.    Review of Systems  Constitutional: Negative for fever and chills.  HENT: Positive for congestion, rhinorrhea and sneezing. Negative for sinus pressure, sore throat and trouble swallowing.   Respiratory: Positive for cough (productive- white).   Cardiovascular: Negative.   Gastrointestinal: Negative.   Neurological: Negative.   Psychiatric/Behavioral: Negative.   All other systems reviewed and are negative.      Objective:   Physical Exam  Constitutional: She is oriented to person, place, and time. She appears well-developed and well-nourished. No distress.  HENT:  Right Ear: Hearing, tympanic membrane, external ear and ear canal normal.  Left Ear: Hearing, tympanic membrane, external ear and ear canal normal.  Nose: Mucosal edema and rhinorrhea present. Right sinus exhibits no maxillary sinus tenderness and no frontal sinus tenderness. Left sinus exhibits no maxillary sinus tenderness and no frontal sinus tenderness.  Mouth/Throat: Uvula is midline, oropharynx is clear and moist and mucous membranes are normal.  Eyes: Pupils are equal, round, and reactive to light.  Neck: Normal range of motion. Neck supple.  Cardiovascular: Normal rate.   Murmur (2/6 systolic) heard. Pulmonary/Chest: Effort normal. She has wheezes (isp and exp throughout).  Deep wet cough  Lymphadenopathy:    She has no cervical adenopathy.  Neurological: She is alert and oriented to person, place, and time.  Skin: Skin is warm and dry.  Psychiatric: She has a normal mood and affect. Her behavior is normal. Judgment and thought content normal.   S/p nebulizer xopenex treatment- looser cough with exp wheezes only  BP 156/79  Pulse 90  Temp(Src)  98.7 F (37.1 C) (Oral)  Ht 5\' 2"  (1.575 m)  Wt 156 lb (70.761 kg)  BMI 28.53 kg/m2      Assessment & Plan:   1. Acute asthmatic bronchitis    Meds ordered this encounter  Medications  . azithromycin (ZITHROMAX Z-PAK) 250 MG tablet    Sig: As directed    Dispense:  6 each    Refill:  0    Order Specific Question:  Supervising Provider    Answer:  Chipper Herb [[id number]]  . benzonatate (TESSALON) 100 MG capsule    Sig: Take 1 capsule (100 mg total) by mouth 2 (two) times daily as needed for cough.    Dispense:  20 capsule    Refill:  0    Order Specific Question:  Supervising Provider    Answer:  Chipper Herb [[id number]]  . predniSONE (DELTASONE) 20 MG tablet    Sig: 2 po at the sametime daily X 5 day    Dispense:  10 tablet    Refill:  0    Order Specific Question:  Supervising Provider    Answer:  Chipper Herb [[id number]]   1. Take meds as prescribed 2. Use a cool mist humidifier especially during the winter months and when heat has been humid. 3. Use saline nose sprays frequently 4. Saline irrigations of the nose can be very helpful if done frequently.  * 4X daily for 1 week*  * Use of a nettie pot can be helpful with this. Follow directions with this* 5. Drink plenty of fluids 6. Keep thermostat turn down low 7.For any  cough or congestion  Use plain Mucinex- regular strength or max strength is fine   * Children- consult with Pharmacist for dosing 8. For fever or aces or pains- take tylenol or ibuprofen appropriate for age and weight.  * for fevers greater than 101 orally you may alternate ibuprofen and tylenol every  3 hours.   Mary-Margaret Hassell Done, FNP  * refilled xanax 0.25 BID #60 0 refills

## 2013-12-22 NOTE — Patient Instructions (Signed)

## 2013-12-22 NOTE — Telephone Encounter (Signed)
appt given for today with mmm 

## 2014-01-18 ENCOUNTER — Other Ambulatory Visit: Payer: Self-pay | Admitting: General Practice

## 2014-01-18 ENCOUNTER — Other Ambulatory Visit: Payer: Self-pay | Admitting: Family Medicine

## 2014-01-18 ENCOUNTER — Other Ambulatory Visit: Payer: Self-pay | Admitting: Nurse Practitioner

## 2014-01-19 NOTE — Telephone Encounter (Signed)
rx called in

## 2014-01-19 NOTE — Telephone Encounter (Signed)
Last seen 12/22/13  MMM If approved route to nurse to call into [facility]

## 2014-01-19 NOTE — Telephone Encounter (Signed)
Please call in xanax with 1 refills 

## 2014-03-20 ENCOUNTER — Other Ambulatory Visit: Payer: Self-pay | Admitting: Nurse Practitioner

## 2014-03-22 NOTE — Telephone Encounter (Signed)
Last seen 12/22/13, last filled 02/16/14. [facility]

## 2014-03-23 NOTE — Telephone Encounter (Signed)
Please call in [place] with 1refills

## 2014-03-23 NOTE — Telephone Encounter (Signed)
Called to [facility] 

## 2014-04-13 ENCOUNTER — Ambulatory Visit (INDEPENDENT_AMBULATORY_CARE_PROVIDER_SITE_OTHER): Payer: Medicare Other | Admitting: Obstetrics & Gynecology

## 2014-04-13 VITALS — BP 150/80 | Wt 159.0 lb

## 2014-04-13 DIAGNOSIS — N813 Complete uterovaginal prolapse: Secondary | ICD-10-CM

## 2014-04-13 NOTE — Progress Notes (Signed)
Patient ID: Christie Bruce, female   DOB: Nov 07, 1940, 73 y.o.   MRN: [id number] gelhorn pessary in place 2 1/4 inch since 12/01/2013 for complete procidentia No complaints Minimal occasional discharge  Blood pressure 150/80, weight 159 lb (72.122 kg).  no mucosal breakdown gelhorn in place  Follow up 6 months

## 2014-04-17 ENCOUNTER — Telehealth: Payer: Self-pay | Admitting: Adult Health

## 2014-04-26 ENCOUNTER — Ambulatory Visit: Payer: Medicare Other | Admitting: Oncology

## 2014-05-03 ENCOUNTER — Telehealth: Payer: Self-pay | Admitting: Adult Health

## 2014-05-03 ENCOUNTER — Ambulatory Visit (HOSPITAL_BASED_OUTPATIENT_CLINIC_OR_DEPARTMENT_OTHER): Payer: Medicare Other | Admitting: Adult Health

## 2014-05-03 ENCOUNTER — Encounter: Payer: Self-pay | Admitting: Adult Health

## 2014-05-03 VITALS — BP 135/60 | HR 70 | Temp 98.5°F | Resp 18 | Ht 62.0 in | Wt 157.0 lb

## 2014-05-03 DIAGNOSIS — E2839 Other primary ovarian failure: Secondary | ICD-10-CM

## 2014-05-03 DIAGNOSIS — Z23 Encounter for immunization: Secondary | ICD-10-CM

## 2014-05-03 DIAGNOSIS — D0510 Intraductal carcinoma in situ of unspecified breast: Secondary | ICD-10-CM

## 2014-05-03 DIAGNOSIS — Z853 Personal history of malignant neoplasm of breast: Secondary | ICD-10-CM

## 2014-05-03 MED ORDER — INFLUENZA VAC SPLIT QUAD 0.5 ML IM SUSY
0.5000 mL | PREFILLED_SYRINGE | Freq: Once | INTRAMUSCULAR | Status: AC
  Administered 2014-05-03: 0.5 mL via INTRAMUSCULAR
  Filled 2014-05-03: qty 0.5

## 2014-05-03 NOTE — Progress Notes (Signed)
OFFICE PROGRESS NOTE  CC Dr. Vonna Drafts Dr. 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  PRIOR THERAPY:  #1 patient underwent bilateral mastectomies by Dr. Donne Hazel in 06/2010. Final pathology showed ductal carcinoma in situ.   CURRENT THERAPY:Observation  INTERVAL HISTORY: STPEHANIE Bruce 73 y.o. female returns for Followup visit today.  She is doing well today.  We reviewed her breast cancer history.  She was diagnosed with bilateral DCIS and underwent bilateral mastectomies.  She has been doing well since.  She denies any fevers, chills, new pain, unintentional weight loss, bowel/bladder changes, or any further concerns.  We updated her health maintenance below.    MEDICAL HISTORY: Past Medical History  Diagnosis Date  . Breast cancer   . Hypertension   . GERD (gastroesophageal reflux disease)   . Uterine prolapse   . 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 GERD (gastroesophageal reflux disease) 09/12/2011  . Heart murmur     ALLERGIES:  is allergic to celebrex and sulfa antibiotics.  MEDICATIONS:  Current Outpatient Prescriptions  Medication Sig Dispense Refill  . ALPRAZolam (XANAX) 0.25 MG tablet TAKE ONE TABLET TWICE DAILY AS NEEDED  60 tablet  1  . lisinopril-hydrochlorothiazide (PRINZIDE,ZESTORETIC) 20-25 MG per tablet Take 1 tablet by mouth daily.  30 tablet  3  . meloxicam (MOBIC) 7.5 MG tablet TAKE ONE (1) TABLET EACH DAY  30 tablet  1  . albuterol (PROAIR HFA) 108 (90 BASE) MCG/ACT inhaler Inhale 2 puffs into the lungs every 6 (six) hours as needed for wheezing or shortness of breath.  1 Inhaler  0   No current facility-administered medications for this visit.    SURGICAL HISTORY:  Past Surgical History  Procedure Laterality Date  . Bilateral mastectomy  07/19/10    bilat mastectomy for DCIS   . Knee surgery Left   . Wrist surgery Left     REVIEW OF SYSTEMS:  A 10 point review of systems was conducted and is otherwise negative except for what is noted above.    Health Maintenance  Mammogram: n/a Colonoscopy: will not undergo Bone Density Scan: thinks she has had done Pap Smear: 2014 Eye Exam: 2013 Lipid Panel: 04/2013    PHYSICAL EXAMINATION:  BP 135/60  Pulse 70  Temp(Src) 98.5 F (36.9 C) (Oral)  Resp 18  Ht 5\' 2"  (1.575 m)  Wt 157 lb (71.215 kg)  BMI 28.71 kg/m2  SpO2 97% GENERAL: Patient is a well appearing female in no acute distress HEENT:  Sclerae anicteric.  Oropharynx clear and moist. No ulcerations or evidence of oropharyngeal candidiasis. Neck is supple.  NODES:  No cervical, supraclavicular, or axillary lymphadenopathy palpated.  BREAST EXAM:  S/p bilateral mastectomy, no nodularity, no sign of recurrence. LUNGS:  Clear to auscultation bilaterally.  No wheezes or rhonchi. HEART:  Regular rate and rhythm. No murmur appreciated. ABDOMEN:  Soft, nontender.  Positive, normoactive bowel sounds. No organomegaly palpated. MSK:  No focal spinal tenderness to palpation. Full range of motion bilaterally in the upper extremities. EXTREMITIES:  No peripheral edema.   SKIN:  Clear with no obvious rashes or skin changes. No nail dyscrasia. NEURO:  Nonfocal. Well oriented.  Appropriate affect. ECOG PERFORMANCE STATUS: 1 - Symptomatic but completely ambulatory    LABORATORY DATA: Lab Results  Component Value Date   WBC 4.9 05/14/2013   HGB 10.9* 05/14/2013  HCT 33.1* 05/14/2013   MCV 88.7 05/14/2013   PLT 253 09/12/2011      Chemistry      Component Value Date/Time   NA 143 05/14/2013 0949   NA 143 09/12/2011 1442   K 4.9 05/14/2013 0949   CL 105 05/14/2013 0949   CO2 25 05/14/2013 0949   BUN 23 05/14/2013 0949   BUN 24* 09/12/2011 1442   CREATININE 1.06* 05/14/2013 0949      Component Value Date/Time   CALCIUM 9.7 05/14/2013 0949   ALKPHOS 107  05/14/2013 0949   AST 18 05/14/2013 0949   ALT 11 05/14/2013 0949   BILITOT 0.8 05/14/2013 0949       RADIOGRAPHIC STUDIES:  No results found.  ASSESSMENT: 73 year old female with bilateral ductal carcinoma in situ status post bilateral simple mastectomies on 07/19/2010. She is without clinical evidence of recurrent disease. Overall she is doing well.   PLAN:   Nechuma is doing well today.  She has no sign of recurrence.  I recommended healthy diet, exercise, and monthly breast exam.  She is unsure of a bone density test.  I will order one today.  I explained why this is important and gave her information about this in her AVS.  She declines referral for a colonoscopy.  She will receive her flu shot today.    She will return in one year for follow up.     All questions were answered. The patient knows to call the clinic with any problems, questions or concerns. We can certainly see the patient much sooner if necessary.  I spent 25 minutes counseling the patient face to face. The total time spent in the appointment was 15 minutes.   Minette Headland, [place] [phone]   05/03/2014, 12:00 PM

## 2014-05-03 NOTE — Telephone Encounter (Signed)
per pof to sch pt appt-sch pt DEXA appt-gave pt copy of sch

## 2014-05-03 NOTE — Patient Instructions (Signed)
You are doing well.  You have no sign of recurrence.  I recommend healthy diet, exercise and breast exams.  We will see you back in one year.    Bone Densitometry Bone densitometry is a special X-ray that measures your bone density and can be used to help predict your risk of bone fractures. This test is used to determine bone mineral content and density to diagnose osteoporosis. Osteoporosis is the loss of bone that may cause the bone to become weak. Osteoporosis commonly occurs in women entering menopause. However, it may be found in men and in people with other diseases. PREPARATION FOR TEST No preparation necessary. WHO SHOULD BE TESTED?  All women older than 31.  Postmenopausal women (50 to 53) with risk factors for osteoporosis.  People with a previous fracture caused by normal activities.  People with a small body frame (less than 127 poundsor a body mass index [BMI] of less than 21).  People who have a parent with a hip fracture or history of osteoporosis.  People who smoke.  People who have rheumatoid arthritis.  Anyone who engages in excessive alcohol use (more than 3 drinks most days).  Women who experience early menopause. WHEN SHOULD YOU BE RETESTED? Current guidelines suggest that you should wait at least 2 years before doing a bone density test again if your first test was normal.Recent studies indicated that women with normal bone density may be able to wait a few years before needing to repeat a bone density test. You should discuss this with your caregiver.  NORMAL FINDINGS   Normal: less than standard deviation below normal (greater than -1).  Osteopenia: 1 to 2.5 standard deviations below normal (-1 to -2.5).  Osteoporosis: greater than 2.5 standard deviations below normal (less than -2.5). Test results are reported as a "T score" and a "Z score."The T score is a number that compares your bone density with the bone density of healthy, young women.The Z score  is a number that compares your bone density with the scores of women who are the same age, gender, and race.  Ranges for normal findings may vary among different laboratories and hospitals. You should always check with your doctor after having lab work or other tests done to discuss the meaning of your test results and whether your values are considered within normal limits. MEANING OF TEST  Your caregiver will go over the test results with you and discuss the importance and meaning of your results, as well as treatment options and the need for additional tests if necessary. OBTAINING THE TEST RESULTS It is your responsibility to obtain your test results. Ask the lab or department performing the test when and how you will get your results. Document Released: 07/31/2004 Document Revised: 10/01/2011 Document Reviewed: 08/23/2010 [facility] Patient Information 2015 [place], [place]. This information is not intended to replace advice given to you by your health care provider. Make sure you discuss any questions you have with your health care provider.

## 2014-05-11 ENCOUNTER — Other Ambulatory Visit: Payer: Self-pay | Admitting: Nurse Practitioner

## 2014-05-12 ENCOUNTER — Other Ambulatory Visit: Payer: Medicare Other

## 2014-05-12 NOTE — Telephone Encounter (Signed)
Last seen 12/22/13  MMM

## 2014-05-24 ENCOUNTER — Encounter: Payer: Self-pay | Admitting: Adult Health

## 2014-06-21 ENCOUNTER — Other Ambulatory Visit: Payer: Self-pay | Admitting: Nurse Practitioner

## 2014-06-21 NOTE — Telephone Encounter (Signed)
Last filled 05/11/14, last seen 12/22/13, Pt uses [facility]

## 2014-06-21 NOTE — Telephone Encounter (Signed)
Please call in xanax 0.25 1 PO BID prn #60  with 0 refills

## 2014-07-05 ENCOUNTER — Telehealth: Payer: Self-pay | Admitting: Hematology and Oncology

## 2014-07-05 NOTE — Telephone Encounter (Signed)
, °

## 2014-07-28 DIAGNOSIS — L570 Actinic keratosis: Secondary | ICD-10-CM | POA: Diagnosis not present

## 2014-07-28 DIAGNOSIS — D239 Other benign neoplasm of skin, unspecified: Secondary | ICD-10-CM | POA: Diagnosis not present

## 2014-07-30 ENCOUNTER — Other Ambulatory Visit: Payer: Self-pay | Admitting: Nurse Practitioner

## 2014-08-03 ENCOUNTER — Other Ambulatory Visit: Payer: Self-pay | Admitting: Nurse Practitioner

## 2014-08-03 NOTE — Telephone Encounter (Signed)
Please call in xanax with 0 refills no more refills without being seen  

## 2014-08-03 NOTE — Telephone Encounter (Signed)
Please advise on refill.  Last seen 12/22/13 by MMM, no follow up scheduled.

## 2014-08-04 NOTE — Telephone Encounter (Signed)
rx called into pharmacy

## 2014-08-26 ENCOUNTER — Other Ambulatory Visit: Payer: Self-pay | Admitting: Nurse Practitioner

## 2014-09-03 ENCOUNTER — Encounter: Payer: Self-pay | Admitting: Nurse Practitioner

## 2014-09-03 ENCOUNTER — Ambulatory Visit (INDEPENDENT_AMBULATORY_CARE_PROVIDER_SITE_OTHER): Payer: Medicare Other | Admitting: Nurse Practitioner

## 2014-09-03 VITALS — BP 160/78 | HR 78 | Temp 97.3°F | Ht 62.0 in | Wt 161.0 lb

## 2014-09-03 DIAGNOSIS — K219 Gastro-esophageal reflux disease without esophagitis: Secondary | ICD-10-CM | POA: Diagnosis not present

## 2014-09-03 DIAGNOSIS — I10 Essential (primary) hypertension: Secondary | ICD-10-CM | POA: Diagnosis not present

## 2014-09-03 DIAGNOSIS — F411 Generalized anxiety disorder: Secondary | ICD-10-CM | POA: Diagnosis not present

## 2014-09-03 DIAGNOSIS — Z23 Encounter for immunization: Secondary | ICD-10-CM

## 2014-09-03 MED ORDER — ALPRAZOLAM 0.25 MG PO TABS: ORAL_TABLET | ORAL | Status: DC

## 2014-09-03 MED ORDER — OMEPRAZOLE 40 MG PO CPDR: 40.0000 mg | DELAYED_RELEASE_CAPSULE | Freq: Every day | ORAL | Status: DC

## 2014-09-03 MED ORDER — LISINOPRIL-HYDROCHLOROTHIAZIDE 20-25 MG PO TABS: ORAL_TABLET | ORAL | Status: DC

## 2014-09-03 NOTE — Addendum Note (Signed)
Addended by: Rolena Infante on: 09/03/2014 10:52 AM   Modules accepted: Orders

## 2014-09-03 NOTE — Patient Instructions (Signed)

## 2014-09-03 NOTE — Progress Notes (Signed)
Subjective:    Patient ID: Christie Bruce, female    DOB: 1940-09-29, 74 y.o.   MRN: [id number]   Patient here today for follow up of chronic medical problems. Has been out of blood pressure meds.   Hypertension This is a chronic problem. The current episode started more than 1 year ago. The problem is unchanged. The problem is controlled. Associated symptoms include peripheral edema. Pertinent negatives include no blurred vision, chest pain, headaches, palpitations or shortness of breath. Risk factors for coronary artery disease include family history and post-menopausal state. Past treatments include ACE inhibitors and diuretics. Compliance problems include exercise and diet (Ran out of medications. ).  There is no history of sleep apnea.  GAD This is a chronic problem. The current episode started more than 1 year ago. The problem is unchanged. The problem is controlled. The patient takes 1/4 tab in the morning, but may not take the 1/4 tab in the evening if she feels it is unneeded. The patient denies feeling excessive anxiety as long as she takes her medications.  GERD Currently no on any meds. The patient reports experiencing episodes of epigastric burning lasting 1-7 days, but reports she may go several weeks between episodes. The patient denies noticing any relation between dietary habits and pain patterns.    Review of Systems  Eyes: Negative for blurred vision.  Respiratory: Negative for shortness of breath.   Cardiovascular: Negative for chest pain and palpitations.  Neurological: Negative for headaches.  All other systems reviewed and are negative.      Objective:   Physical Exam  Constitutional: She is oriented to person, place, and time. She appears well-developed and well-nourished.  HENT:  Nose: Nose normal.  Mouth/Throat: Oropharynx is clear and moist.  Eyes: EOM are normal.  Neck: Trachea normal, normal range of motion and full passive range of motion without pain.  Neck supple. No JVD present. Carotid bruit is not present. No thyromegaly present.  Cardiovascular: Normal rate, regular rhythm and intact distal pulses.  Exam reveals no gallop and no friction rub.   Murmur (2/6 systolic) heard. Pulmonary/Chest: Effort normal and breath sounds normal.  Abdominal: Soft. Bowel sounds are normal. She exhibits no distension and no mass. There is no tenderness.  Musculoskeletal: Normal range of motion.  Lymphadenopathy:    She has no cervical adenopathy.  Neurological: She is alert and oriented to person, place, and time. She has normal reflexes.  Skin: Skin is warm and dry.  Psychiatric: She has a normal mood and affect. Her behavior is normal. Judgment and thought content normal.   BP 160/78 mmHg  Pulse 78  Temp(Src) 97.3 F (36.3 C) (Oral)  Ht 5' 2"  (1.575 m)  Wt 161 lb (73.029 kg)  BMI 29.44 kg/m2         Assessment & Plan:  1. Gastroesophageal reflux disease without esophagitis Watch spicy foods Do not eat 2 hours prior to bedtime - omeprazole (PRILOSEC) 40 MG capsule; Take 1 capsule (40 mg total) by mouth daily.  Dispense: 30 capsule; Refill: 3  2. Essential hypertension Do not add slat to diet - CMP14+EGFR - NMR, lipoprofile - lisinopril-hydrochlorothiazide (PRINZIDE,ZESTORETIC) 20-25 MG per tablet; TAKE ONE (1) TABLET EACH DAY  Dispense: 30 tablet; Refill: 5  3. GAD (generalized anxiety disorder) Stress management - ALPRAZolam (XANAX) 0.25 MG tablet; TAKE ONE TABLET TWICE DAILY AS NEEDED  Dispense: 60 tablet; Refill: 5   prevnar 13 today Labs pending Health maintenance reviewed Diet and exercise encouraged  Continue all meds Follow up  In 3 months   [place], FNP

## 2014-09-04 LAB — CMP14+EGFR
A/G RATIO: 1.7 (ref 1.1–2.5)
ALBUMIN: 4.3 g/dL (ref 3.5–4.8)
ALT: 11 IU/L (ref 0–32)
AST: 18 IU/L (ref 0–40)
Alkaline Phosphatase: 96 IU/L (ref 39–117)
BILIRUBIN TOTAL: 0.8 mg/dL (ref 0.0–1.2)
BUN/Creatinine Ratio: 15 (ref 11–26)
BUN: 15 mg/dL (ref 8–27)
CALCIUM: 8.9 mg/dL (ref 8.7–10.3)
CO2: 23 mmol/L (ref 18–29)
CREATININE: 1.01 mg/dL — AB (ref 0.57–1.00)
Chloride: 112 mmol/L — ABNORMAL HIGH (ref 97–108)
GFR calc Af Amer: 64 mL/min/{1.73_m2} (ref >59)
GFR, EST NON AFRICAN AMERICAN: 55 mL/min/{1.73_m2} — AB (ref >59)
Globulin, Total: 2.6 g/dL (ref 1.5–4.5)
Glucose: 84 mg/dL (ref 65–99)
Potassium: 4.3 mmol/L (ref 3.5–5.2)
Sodium: 145 mmol/L — ABNORMAL HIGH (ref 134–144)
Total Protein: 6.9 g/dL (ref 6.0–8.5)

## 2014-09-04 LAB — NMR, LIPOPROFILE
Cholesterol: 174 mg/dL (ref 100–199)
HDL Cholesterol by NMR: 70 mg/dL (ref >39)
HDL Particle Number: 34.1 umol/L (ref >=30.5)
LDL PARTICLE NUMBER: 845 nmol/L (ref <1000)
LDL SIZE: 21.7 nm (ref >20.5)
LDL-C: 91 mg/dL (ref 0–99)
LP-IR Score: <25 (ref <=45)
Small LDL Particle Number: 193 nmol/L (ref <=527)
Triglycerides by NMR: 67 mg/dL (ref 0–149)

## 2014-10-12 ENCOUNTER — Encounter: Payer: Self-pay | Admitting: Obstetrics & Gynecology

## 2014-10-12 ENCOUNTER — Ambulatory Visit (INDEPENDENT_AMBULATORY_CARE_PROVIDER_SITE_OTHER): Payer: Medicare Other | Admitting: Obstetrics & Gynecology

## 2014-10-12 VITALS — BP 150/70 | HR 76 | Wt 162.0 lb

## 2014-10-12 DIAGNOSIS — N813 Complete uterovaginal prolapse: Secondary | ICD-10-CM | POA: Diagnosis not present

## 2014-10-12 NOTE — Progress Notes (Signed)
Patient ID: Christie Bruce, female   DOB: 09-20-1940, 74 y.o.   MRN: [id number]      Christie Bruce presents today for routine follow up related to her pessary.   She uses a Tax adviser #2 She reports no vaginal discharge or vaginal bleeding.  Exam reveals no undue vaginal mucosal pressure of breakdown, no discharge and no vaginal bleeding.  The pessary is not removable since it is a [employer] will be sen back in 6 months for continued follow up.  EURE,LUTHER H 10/12/2014 10:22 AM

## 2014-10-13 ENCOUNTER — Other Ambulatory Visit: Payer: Self-pay | Admitting: Nurse Practitioner

## 2014-12-13 ENCOUNTER — Other Ambulatory Visit: Payer: Self-pay | Admitting: Nurse Practitioner

## 2014-12-28 ENCOUNTER — Ambulatory Visit: Payer: Medicare Other | Admitting: *Deleted

## 2015-01-14 ENCOUNTER — Other Ambulatory Visit: Payer: Self-pay | Admitting: Nurse Practitioner

## 2015-02-19 ENCOUNTER — Other Ambulatory Visit: Payer: Self-pay | Admitting: Family Medicine

## 2015-02-21 NOTE — Telephone Encounter (Signed)
Rx called in 

## 2015-02-21 NOTE — Telephone Encounter (Signed)
Please call in xanax with 1 refills 

## 2015-02-21 NOTE — Telephone Encounter (Signed)
Last filled 01/14/15, last seen 09/03/14. Pt uses [facility]

## 2015-03-03 ENCOUNTER — Ambulatory Visit: Payer: Medicare Other

## 2015-03-22 ENCOUNTER — Other Ambulatory Visit: Payer: Self-pay | Admitting: Nurse Practitioner

## 2015-04-15 DIAGNOSIS — H59093 Other disorders of the eye following cataract surgery, bilateral: Secondary | ICD-10-CM | POA: Diagnosis not present

## 2015-04-15 DIAGNOSIS — Z961 Presence of intraocular lens: Secondary | ICD-10-CM | POA: Diagnosis not present

## 2015-04-15 DIAGNOSIS — H524 Presbyopia: Secondary | ICD-10-CM | POA: Diagnosis not present

## 2015-04-19 ENCOUNTER — Ambulatory Visit (INDEPENDENT_AMBULATORY_CARE_PROVIDER_SITE_OTHER): Payer: Medicare Other | Admitting: Obstetrics & Gynecology

## 2015-04-19 ENCOUNTER — Encounter: Payer: Self-pay | Admitting: Obstetrics & Gynecology

## 2015-04-19 VITALS — BP 124/80 | Ht 62.0 in | Wt 158.0 lb

## 2015-04-19 DIAGNOSIS — N813 Complete uterovaginal prolapse: Secondary | ICD-10-CM

## 2015-04-19 NOTE — Progress Notes (Signed)
Patient ID: Christie Bruce, female   DOB: 08/14/1940, 74 y.o.   MRN: [id number] Chief Complaint  Patient presents with  . Pessary Check    Blood pressure 124/80, height 5\' 2"  (1.575 m), weight 158 lb (71.668 kg).  Christie Bruce presents today for routine follow up related to her pessary.   She uses a gelhorn #3 She reports little vaginal discharge or vaginal bleeding.  Exam reveals no undue vaginal mucosal pressure of breakdown, no discharge and little vaginal bleeding.  The pessary is left in place, it is a [employer] will be sen back in 6 months for continued follow up.  Florian Buff, MD   04/19/2015 9:40 AM

## 2015-04-22 ENCOUNTER — Other Ambulatory Visit: Payer: Self-pay | Admitting: Nurse Practitioner

## 2015-04-25 ENCOUNTER — Telehealth: Payer: Self-pay | Admitting: *Deleted

## 2015-04-25 NOTE — Telephone Encounter (Signed)
Refill called to [facility] 

## 2015-04-25 NOTE — Telephone Encounter (Signed)
Last seen 09/03/14  MMM  If approved route to nurse to call into [facility]

## 2015-04-25 NOTE — Telephone Encounter (Signed)
Please call in alprazolam with 0 refills no more refills without being seen

## 2015-04-25 NOTE — Telephone Encounter (Signed)
Lmtcb, pt's refills have been sent [facility], but she will need another appt before her next refill

## 2015-04-26 NOTE — Telephone Encounter (Signed)
Pt aware that she NTBS before they filled again; Scheduled pt for 05/13/2015 at 12:30 with MMM; Flu shot 05/13/2015 at 12:15 Pt aware of all appointment date/times

## 2015-05-04 ENCOUNTER — Ambulatory Visit: Payer: Medicare Other

## 2015-05-08 NOTE — Assessment & Plan Note (Signed)
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 Breast exam 05/09/15: Normal 2. Mammogram No role for it since she had bilateral mastectomies.  RTC in 1 year

## 2015-05-09 ENCOUNTER — Ambulatory Visit (HOSPITAL_BASED_OUTPATIENT_CLINIC_OR_DEPARTMENT_OTHER): Payer: Medicare Other | Admitting: Hematology and Oncology

## 2015-05-09 ENCOUNTER — Telehealth: Payer: Self-pay | Admitting: Hematology and Oncology

## 2015-05-09 ENCOUNTER — Encounter: Payer: Self-pay | Admitting: Hematology and Oncology

## 2015-05-09 VITALS — BP 156/62 | HR 75 | Temp 98.7°F | Resp 17 | Ht 62.0 in | Wt 160.8 lb

## 2015-05-09 DIAGNOSIS — Z86000 Personal history of in-situ neoplasm of breast: Secondary | ICD-10-CM

## 2015-05-09 DIAGNOSIS — D0510 Intraductal carcinoma in situ of unspecified breast: Secondary | ICD-10-CM

## 2015-05-09 NOTE — Addendum Note (Signed)
Addended by: Prentiss Bells on: 05/09/2015 12:34 PM   Modules accepted: Orders, Medications

## 2015-05-09 NOTE — Progress Notes (Signed)
Patient Care Team: Chevis Pretty, FNP as PCP - General (Nurse Practitioner)  DIAGNOSIS: Bilateral DCIS.  SUMMARY OF ONCOLOGIC HISTORY: Status post bilateral mastectomies December 2011 CHIEF COMPLIANT: Follow-up of history of bilateral DCIS  INTERVAL HISTORY: Christie Bruce is a 74 year old with above-mentioned history of bilateral DCIS status post bilateral mastectomies in December 2011. She is here for annual follow-up. She reports no new problems or concerns. She will be turning 75 soon. She denies any lumps or nodules in her chest wall or axilla.  REVIEW OF SYSTEMS:   Constitutional: Denies fevers, chills or abnormal weight loss Eyes: Denies blurriness of vision Ears, nose, mouth, throat, and face: Denies mucositis or sore throat Respiratory: Denies cough, dyspnea or wheezes Cardiovascular: Denies palpitation, chest discomfort or lower extremity swelling Gastrointestinal:  Denies nausea, heartburn or change in bowel habits Skin: Denies abnormal skin rashes Lymphatics: Denies new lymphadenopathy or easy bruising Neurological:Denies numbness, tingling or new weaknesses Behavioral/Psych: Mood is stable, no new changes  Breast:  denies any pain or lumps or nodules in either breasts All other systems were reviewed with the patient and are negative.  I have reviewed the past medical history, past surgical history, social history and family history with the patient and they are unchanged from previous note.  ALLERGIES:  is allergic to celebrex and sulfa antibiotics.  MEDICATIONS:  Current Outpatient Prescriptions  Medication Sig Dispense Refill  . albuterol (PROAIR HFA) 108 (90 BASE) MCG/ACT inhaler Inhale 2 puffs into the lungs every 6 (six) hours as needed for wheezing or shortness of breath. 1 Inhaler 0  . ALPRAZolam (XANAX) 0.25 MG tablet TAKE ONE TABLET TWICE DAILY AS NEEDED 60 tablet 0  . lisinopril-hydrochlorothiazide (PRINZIDE,ZESTORETIC) 20-25 MG per tablet TAKE ONE (1)  TABLET EACH DAY 30 tablet 5  . lisinopril-hydrochlorothiazide (PRINZIDE,ZESTORETIC) 20-25 MG tablet TAKE ONE (1) TABLET EACH DAY 30 tablet 0  . meloxicam (MOBIC) 7.5 MG tablet TAKE ONE (1) TABLET EACH DAY 30 tablet 0  . omeprazole (PRILOSEC) 40 MG capsule Take 1 capsule (40 mg total) by mouth daily. 30 capsule 3   No current facility-administered medications for this visit.    PHYSICAL EXAMINATION: ECOG PERFORMANCE STATUS: 0 - Asymptomatic  Filed Vitals:   05/09/15 0915  BP: 156/62  Pulse: 75  Temp: 98.7 F (37.1 C)  Resp: 17   Filed Weights   05/09/15 0915  Weight: 160 lb 12.8 oz (72.938 kg)    GENERAL:alert, no distress and comfortable SKIN: skin color, texture, turgor are normal, no rashes or significant lesions EYES: normal, Conjunctiva are pink and non-injected, sclera clear OROPHARYNX:no exudate, no erythema and lips, buccal mucosa, and tongue normal  NECK: supple, thyroid normal size, non-tender, without nodularity LYMPH:  no palpable lymphadenopathy in the cervical, axillary or inguinal LUNGS: clear to auscultation and percussion with normal breathing effort HEART: regular rate & rhythm and no murmurs and no lower extremity edema ABDOMEN:abdomen soft, non-tender and normal bowel sounds Musculoskeletal:no cyanosis of digits and no clubbing  NEURO: alert & oriented x 3 with fluent speech, no focal motor/sensory deficits BREAST: No palpable masses or nodules in either right or left breasts. No palpable axillary supraclavicular or infraclavicular adenopathy no breast tenderness or nipple discharge. (exam performed in the presence of a chaperone)  LABORATORY DATA:  I have reviewed the data as listed   Chemistry      Component Value Date/Time   NA 145* 09/03/2014 0915   NA 143 09/12/2011 1442   K 4.3 09/03/2014 0915  CL 112* 09/03/2014 0915   CO2 23 09/03/2014 0915   BUN 15 09/03/2014 0915   BUN 24* 09/12/2011 1442   CREATININE 1.01* 09/03/2014 0915       Component Value Date/Time   CALCIUM 8.9 09/03/2014 0915   ALKPHOS 96 09/03/2014 0915   AST 18 09/03/2014 0915   ALT 11 09/03/2014 0915   BILITOT 0.8 09/03/2014 0915   BILITOT 0.8 05/14/2013 0949       Lab Results  Component Value Date   WBC 4.9 05/14/2013   HGB 10.9* 05/14/2013   HCT 33.1* 05/14/2013   MCV 88.7 05/14/2013   PLT 253 09/12/2011   NEUTROABS 2.8 09/12/2011   ASSESSMENT & PLAN:  DCIS (ductal carcinoma in situ) of breast 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 Breast exam 05/09/15: Normal 2. Mammogram No role for it since she had bilateral mastectomies.  Survivorship: I encouraged her to stay active. I also encouraged her to walk 30 minutes every day for 5 days a week.  RTC in 1 year with survivorship clinic for long-term follow-ups.  No orders of the defined types were placed in this encounter.   The patient has a good understanding of the overall plan. she agrees with it. she will call with any problems that may develop before the next visit here.   Rulon Eisenmenger, MD 05/09/2015

## 2015-05-09 NOTE — Telephone Encounter (Signed)
Appointments made and avs printed for patient °

## 2015-05-13 ENCOUNTER — Ambulatory Visit: Payer: Medicare Other

## 2015-05-13 ENCOUNTER — Encounter: Payer: Self-pay | Admitting: Nurse Practitioner

## 2015-05-13 ENCOUNTER — Ambulatory Visit (INDEPENDENT_AMBULATORY_CARE_PROVIDER_SITE_OTHER): Payer: Medicare Other | Admitting: Nurse Practitioner

## 2015-05-13 ENCOUNTER — Ambulatory Visit (INDEPENDENT_AMBULATORY_CARE_PROVIDER_SITE_OTHER): Payer: Medicare Other

## 2015-05-13 VITALS — BP 140/82 | HR 84 | Temp 97.8°F | Ht 62.0 in | Wt 158.0 lb

## 2015-05-13 DIAGNOSIS — D0510 Intraductal carcinoma in situ of unspecified breast: Secondary | ICD-10-CM

## 2015-05-13 DIAGNOSIS — I10 Essential (primary) hypertension: Secondary | ICD-10-CM

## 2015-05-13 DIAGNOSIS — K219 Gastro-esophageal reflux disease without esophagitis: Secondary | ICD-10-CM | POA: Diagnosis not present

## 2015-05-13 DIAGNOSIS — Z1212 Encounter for screening for malignant neoplasm of rectum: Secondary | ICD-10-CM

## 2015-05-13 DIAGNOSIS — F411 Generalized anxiety disorder: Secondary | ICD-10-CM

## 2015-05-13 DIAGNOSIS — Z23 Encounter for immunization: Secondary | ICD-10-CM

## 2015-05-13 DIAGNOSIS — Z6828 Body mass index (BMI) 28.0-28.9, adult: Secondary | ICD-10-CM | POA: Insufficient documentation

## 2015-05-13 MED ORDER — LISINOPRIL-HYDROCHLOROTHIAZIDE 20-25 MG PO TABS: ORAL_TABLET | ORAL | Status: DC

## 2015-05-13 MED ORDER — ALPRAZOLAM 0.25 MG PO TABS: ORAL_TABLET | ORAL | Status: DC

## 2015-05-13 MED ORDER — OMEPRAZOLE 40 MG PO CPDR: 40.0000 mg | DELAYED_RELEASE_CAPSULE | Freq: Every day | ORAL | Status: DC

## 2015-05-13 NOTE — Progress Notes (Signed)
Subjective:    Patient ID: Christie Bruce, female    DOB: 03-17-1941, 74 y.o.   MRN: [id number]   Patient here today for follow up of chronic medical problems.   Hypertension This is a chronic problem. The current episode started more than 1 year ago. The problem is unchanged. The problem is controlled. Associated symptoms include peripheral edema. Pertinent negatives include no blurred vision, chest pain, headaches, palpitations or shortness of breath. Risk factors for coronary artery disease include family history and post-menopausal state. Past treatments include ACE inhibitors and diuretics. Compliance problems include exercise and diet (Ran out of medications. ).  There is no history of sleep apnea.  GAD This is a chronic problem. The current episode started more than 1 year ago. The problem is unchanged. The problem is controlled. The patient takes 1/4 tab in the morning, but may not take the 1/4 tab in the evening if she feels it is unneeded. The patient denies feeling excessive anxiety as long as she takes her medications.  GERD Currently no on any meds. The patient reports experiencing episodes of epigastric burning lasting 1-7 days, but reports she may go several weeks between episodes. The patient denies noticing any relation between dietary habits and pain patterns.  Hx breast cancer No current reoccurence- does not see oncologist anymore.      Review of Systems  Constitutional: Negative.   HENT: Negative.   Eyes: Negative for blurred vision.  Respiratory: Negative for shortness of breath.   Cardiovascular: Negative for chest pain and palpitations.  Neurological: Negative for headaches.  All other systems reviewed and are negative.      Objective:   Physical Exam  Constitutional: She is oriented to person, place, and time. She appears well-developed and well-nourished.  HENT:  Nose: Nose normal.  Mouth/Throat: Oropharynx is clear and moist.  Eyes: EOM are normal.    Neck: Trachea normal, normal range of motion and full passive range of motion without pain. Neck supple. No JVD present. Carotid bruit is not present. No thyromegaly present.  Cardiovascular: Normal rate, regular rhythm and intact distal pulses.  Exam reveals no gallop and no friction rub.   Murmur (2/6 systolic) heard. Pulmonary/Chest: Effort normal and breath sounds normal.  Abdominal: Soft. Bowel sounds are normal. She exhibits no distension and no mass. There is no tenderness.  Musculoskeletal: Normal range of motion.  Lymphadenopathy:    She has no cervical adenopathy.  Neurological: She is alert and oriented to person, place, and time. She has normal reflexes.  Skin: Skin is warm and dry.  Psychiatric: She has a normal mood and affect. Her behavior is normal. Judgment and thought content normal.    BP 140/82 mmHg  Pulse 84  Temp(Src) 97.8 F (36.6 C) (Oral)  Ht _0  (1.575 m)  Wt 158 lb (71.668 kg)  BMI 28.89 kg/m2  EKG- NSR-Mary-Margaret Hassell Done, FNP  Chest x ray- no cardiopulmonary disease-Preliminary reading by Ronnald Collum, FNP  [facility]     Assessment & Plan:   1. Essential hypertension Do not add salt to diet - lisinopril-hydrochlorothiazide (PRINZIDE,ZESTORETIC) 20-25 MG tablet; TAKE ONE (1) TABLET EACH DAY  Dispense: 30 tablet; Refill: 5 - CMP14+EGFR - Lipid panel - EKG 12-Lead  2. Gastroesophageal reflux disease without esophagitis Avoid spicy foods Do not eat 2 hours prior to bedtime - omeprazole (PRILOSEC) 40 MG capsule; Take 1 capsule (40 mg total) by mouth daily.  Dispense: 30 capsule; Refill: 3  3. GAD (generalized anxiety disorder)  Stress management - ALPRAZolam (XANAX) 0.25 MG tablet; TAKE ONE TABLET TWICE DAILY AS NEEDED  Dispense: 60 tablet; Refill: 0  4. DCIS (ductal carcinoma in situ) of breast, unspecified laterality - DG Chest 2 View; Future  5. BMI 28.0-28.9,adult Discussed diet and exercise for person with BMI >25 Will recheck weight in 3-6  months   6. Screening for malignant neoplasm of the rectum - Fecal occult blood, imunochemical; Future    Labs pending Health maintenance reviewed Diet and exercise encouraged Continue all meds Follow up  In 3 months   [place], FNP

## 2015-05-13 NOTE — Patient Instructions (Signed)
Health Maintenance, Female Adopting a healthy lifestyle and getting preventive care can go a long way to promote health and wellness. Talk with your health care provider about what schedule of regular examinations is right for you. This is a good chance for you to check in with your provider about disease prevention and staying healthy. In between checkups, there are plenty of things you can do on your own. Experts have done a lot of research about which lifestyle changes and preventive measures are most likely to keep you healthy. Ask your health care provider for more information. WEIGHT AND DIET  Eat a healthy diet  Be sure to include plenty of vegetables, fruits, low-fat dairy products, and lean protein.  Do not eat a lot of foods high in solid fats, added sugars, or salt.  Get regular exercise. This is one of the most important things you can do for your health.  Most adults should exercise for at least 150 minutes each week. The exercise should increase your heart rate and make you sweat (moderate-intensity exercise).  Most adults should also do strengthening exercises at least twice a week. This is in addition to the moderate-intensity exercise.  Maintain a healthy weight  Body mass index (BMI) is a measurement that can be used to identify possible weight problems. It estimates body fat based on height and weight. Your health care provider can help determine your BMI and help you achieve or maintain a healthy weight.  For females 20 years of age and older:   A BMI below 18.5 is considered underweight.  A BMI of 18.5 to 24.9 is normal.  A BMI of 25 to 29.9 is considered overweight.  A BMI of 30 and above is considered obese.  Watch levels of cholesterol and blood lipids  You should start having your blood tested for lipids and cholesterol at 74 years of age, then have this test every 5 years.  You may need to have your cholesterol levels checked more often if:  Your lipid  or cholesterol levels are high.  You are older than 74 years of age.  You are at high risk for heart disease.  CANCER SCREENING   Lung Cancer  Lung cancer screening is recommended for adults 55-80 years old who are at high risk for lung cancer because of a history of smoking.  A yearly low-dose CT scan of the lungs is recommended for people who:  Currently smoke.  Have quit within the past 15 years.  Have at least a 30-pack-year history of smoking. A pack year is smoking an average of one pack of cigarettes a day for 1 year.  Yearly screening should continue until it has been 15 years since you quit.  Yearly screening should stop if you develop a health problem that would prevent you from having lung cancer treatment.  Breast Cancer  Practice breast self-awareness. This means understanding how your breasts normally appear and feel.  It also means doing regular breast self-exams. Let your health care provider know about any changes, no matter how small.  If you are in your 20s or 30s, you should have a clinical breast exam (CBE) by a health care provider every 1-3 years as part of a regular health exam.  If you are 40 or older, have a CBE every year. Also consider having a breast X-ray (mammogram) every year.  If you have a family history of breast cancer, talk to your health care provider about genetic screening.  If you   are at high risk for breast cancer, talk to your health care provider about having an MRI and a mammogram every year.  Breast cancer gene (BRCA) assessment is recommended for women who have family members with BRCA-related cancers. BRCA-related cancers include:  Breast.  Ovarian.  Tubal.  Peritoneal cancers.  Results of the assessment will determine the need for genetic counseling and BRCA1 and BRCA2 testing. Cervical Cancer Your health care provider may recommend that you be screened regularly for cancer of the pelvic organs (ovaries, uterus, and  vagina). This screening involves a pelvic examination, including checking for microscopic changes to the surface of your cervix (Pap test). You may be encouraged to have this screening done every 3 years, beginning at age 21.  For women ages 30-65, health care providers may recommend pelvic exams and Pap testing every 3 years, or they may recommend the Pap and pelvic exam, combined with testing for human papilloma virus (HPV), every 5 years. Some types of HPV increase your risk of cervical cancer. Testing for HPV may also be done on women of any age with unclear Pap test results.  Other health care providers may not recommend any screening for nonpregnant women who are considered low risk for pelvic cancer and who do not have symptoms. Ask your health care provider if a screening pelvic exam is right for you.  If you have had past treatment for cervical cancer or a condition that could lead to cancer, you need Pap tests and screening for cancer for at least 20 years after your treatment. If Pap tests have been discontinued, your risk factors (such as having a new sexual partner) need to be reassessed to determine if screening should resume. Some women have medical problems that increase the chance of getting cervical cancer. In these cases, your health care provider may recommend more frequent screening and Pap tests. Colorectal Cancer  This type of cancer can be detected and often prevented.  Routine colorectal cancer screening usually begins at 74 years of age and continues through 75 years of age.  Your health care provider may recommend screening at an earlier age if you have risk factors for colon cancer.  Your health care provider may also recommend using home test kits to check for hidden blood in the stool.  A small camera at the end of a tube can be used to examine your colon directly (sigmoidoscopy or colonoscopy). This is done to check for the earliest forms of colorectal  cancer.  Routine screening usually begins at age 50.  Direct examination of the colon should be repeated every 5-10 years through 75 years of age. However, you may need to be screened more often if early forms of precancerous polyps or small growths are found. Skin Cancer  Check your skin from head to toe regularly.  Tell your health care provider about any new moles or changes in moles, especially if there is a change in a mole's shape or color.  Also tell your health care provider if you have a mole that is larger than the size of a pencil eraser.  Always use sunscreen. Apply sunscreen liberally and repeatedly throughout the day.  Protect yourself by wearing long sleeves, pants, a wide-brimmed hat, and sunglasses whenever you are outside. HEART DISEASE, DIABETES, AND HIGH BLOOD PRESSURE   High blood pressure causes heart disease and increases the risk of stroke. High blood pressure is more likely to develop in:  People who have blood pressure in the high end   of the normal range (130-139/85-89 mm Hg).  People who are overweight or obese.  People who are African American.  If you are 38-23 years of age, have your blood pressure checked every 3-5 years. If you are 61 years of age or older, have your blood pressure checked every year. You should have your blood pressure measured twice--once when you are at a hospital or clinic, and once when you are not at a hospital or clinic. Record the average of the two measurements. To check your blood pressure when you are not at a hospital or clinic, you can use:  An automated blood pressure machine at a pharmacy.  A home blood pressure monitor.  If you are between 45 years and 39 years old, ask your health care provider if you should take aspirin to prevent strokes.  Have regular diabetes screenings. This involves taking a blood sample to check your fasting blood sugar level.  If you are at a normal weight and have a low risk for diabetes,  have this test once every three years after 74 years of age.  If you are overweight and have a high risk for diabetes, consider being tested at a younger age or more often. PREVENTING INFECTION  Hepatitis B  If you have a higher risk for hepatitis B, you should be screened for this virus. You are considered at high risk for hepatitis B if:  You were born in a country where hepatitis B is common. Ask your health care provider which countries are considered high risk.  Your parents were born in a high-risk country, and you have not been immunized against hepatitis B (hepatitis B vaccine).  You have HIV or AIDS.  You use needles to inject street drugs.  You live with someone who has hepatitis B.  You have had sex with someone who has hepatitis B.  You get hemodialysis treatment.  You take certain medicines for conditions, including cancer, organ transplantation, and autoimmune conditions. Hepatitis C  Blood testing is recommended for:  Everyone born from [birth year] through 1965.  Anyone with known risk factors for hepatitis C. Sexually transmitted infections (STIs)  You should be screened for sexually transmitted infections (STIs) including gonorrhea and chlamydia if:  You are sexually active and are younger than 74 years of age.  You are older than 74 years of age and your health care provider tells you that you are at risk for this type of infection.  Your sexual activity has changed since you were last screened and you are at an increased risk for chlamydia or gonorrhea. Ask your health care provider if you are at risk.  If you do not have HIV, but are at risk, it may be recommended that you take a prescription medicine daily to prevent HIV infection. This is called pre-exposure prophylaxis (PrEP). You are considered at risk if:  You are sexually active and do not regularly use condoms or know the HIV status of your partner(s).  You take drugs by injection.  You are sexually  active with a partner who has HIV. Talk with your health care provider about whether you are at high risk of being infected with HIV. If you choose to begin PrEP, you should first be tested for HIV. You should then be tested every 3 months for as long as you are taking PrEP.  PREGNANCY   If you are premenopausal and you may become pregnant, ask your health care provider about preconception counseling.  If you may  become pregnant, take 400 to 800 micrograms (mcg) of folic acid every day.  If you want to prevent pregnancy, talk to your health care provider about birth control (contraception). OSTEOPOROSIS AND MENOPAUSE   Osteoporosis is a disease in which the bones lose minerals and strength with aging. This can result in serious bone fractures. Your risk for osteoporosis can be identified using a bone density scan.  If you are 61 years of age or older, or if you are at risk for osteoporosis and fractures, ask your health care provider if you should be screened.  Ask your health care provider whether you should take a calcium or vitamin D supplement to lower your risk for osteoporosis.  Menopause may have certain physical symptoms and risks.  Hormone replacement therapy may reduce some of these symptoms and risks. Talk to your health care provider about whether hormone replacement therapy is right for you.  HOME CARE INSTRUCTIONS   Schedule regular health, dental, and eye exams.  Stay current with your immunizations.   Do not use any tobacco products including cigarettes, chewing tobacco, or electronic cigarettes.  If you are pregnant, do not drink alcohol.  If you are breastfeeding, limit how much and how often you drink alcohol.  Limit alcohol intake to no more than 1 drink per day for nonpregnant women. One drink equals 12 ounces of beer, 5 ounces of wine, or 1 ounces of hard liquor.  Do not use street drugs.  Do not share needles.  Ask your health care provider for help if  you need support or information about quitting drugs.  Tell your health care provider if you often feel depressed.  Tell your health care provider if you have ever been abused or do not feel safe at home.   This information is not intended to replace advice given to you by your health care provider. Make sure you discuss any questions you have with your health care provider.   Document Released: 01/22/2011 Document Revised: 07/30/2014 Document Reviewed: 06/10/2013 Elsevier Interactive Patient Education [employer].

## 2015-05-14 LAB — CMP14+EGFR
ALT: 13 IU/L (ref 0–32)
AST: 20 IU/L (ref 0–40)
Albumin/Globulin Ratio: 1.7 (ref 1.1–2.5)
Albumin: 4.7 g/dL (ref 3.5–4.8)
Alkaline Phosphatase: 103 IU/L (ref 39–117)
BUN/Creatinine Ratio: 18 (ref 11–26)
BUN: 23 mg/dL (ref 8–27)
Bilirubin Total: 0.7 mg/dL (ref 0.0–1.2)
CALCIUM: 9.3 mg/dL (ref 8.7–10.3)
CO2: 24 mmol/L (ref 18–29)
CREATININE: 1.31 mg/dL — AB (ref 0.57–1.00)
Chloride: 103 mmol/L (ref 97–106)
GFR calc Af Amer: 46 mL/min/{1.73_m2} — ABNORMAL LOW (ref >59)
GFR, EST NON AFRICAN AMERICAN: 40 mL/min/{1.73_m2} — AB (ref >59)
Globulin, Total: 2.7 g/dL (ref 1.5–4.5)
Glucose: 107 mg/dL — ABNORMAL HIGH (ref 65–99)
POTASSIUM: 4.3 mmol/L (ref 3.5–5.2)
Sodium: 143 mmol/L (ref 136–144)
Total Protein: 7.4 g/dL (ref 6.0–8.5)

## 2015-05-14 LAB — LIPID PANEL
CHOL/HDL RATIO: 3 ratio (ref 0.0–4.4)
CHOLESTEROL TOTAL: 192 mg/dL (ref 100–199)
HDL: 63 mg/dL (ref >39)
LDL CALC: 110 mg/dL — AB (ref 0–99)
TRIGLYCERIDES: 94 mg/dL (ref 0–149)
VLDL Cholesterol Cal: 19 mg/dL (ref 5–40)

## 2015-05-16 ENCOUNTER — Encounter: Payer: Self-pay | Admitting: Nurse Practitioner

## 2015-05-16 DIAGNOSIS — N183 Chronic kidney disease, stage 3 unspecified: Secondary | ICD-10-CM | POA: Insufficient documentation

## 2015-05-19 ENCOUNTER — Other Ambulatory Visit: Payer: Self-pay | Admitting: Nurse Practitioner

## 2015-05-19 ENCOUNTER — Other Ambulatory Visit: Payer: Self-pay

## 2015-05-19 MED ORDER — MELOXICAM 7.5 MG PO TABS
ORAL_TABLET | ORAL | Status: DC
Start: 2015-05-19 — End: 2015-06-21

## 2015-05-23 DIAGNOSIS — Z961 Presence of intraocular lens: Secondary | ICD-10-CM | POA: Diagnosis not present

## 2015-05-23 DIAGNOSIS — H1851 Endothelial corneal dystrophy: Secondary | ICD-10-CM | POA: Diagnosis not present

## 2015-05-23 DIAGNOSIS — H5034 Intermittent alternating exotropia: Secondary | ICD-10-CM | POA: Diagnosis not present

## 2015-05-23 DIAGNOSIS — H35363 Drusen (degenerative) of macula, bilateral: Secondary | ICD-10-CM | POA: Diagnosis not present

## 2015-06-21 ENCOUNTER — Other Ambulatory Visit: Payer: Self-pay | Admitting: Family Medicine

## 2015-06-21 ENCOUNTER — Other Ambulatory Visit: Payer: Self-pay | Admitting: Nurse Practitioner

## 2015-06-21 NOTE — Telephone Encounter (Signed)
Last seen 05/13/15 MMM If approved route to nurse to call into [facility]

## 2015-06-21 NOTE — Telephone Encounter (Signed)
Please call in xanax with 1 refills 

## 2015-06-21 NOTE — Telephone Encounter (Signed)
rx called into pharmacy

## 2015-06-23 ENCOUNTER — Ambulatory Visit: Payer: Medicare Other | Admitting: Pediatrics

## 2015-07-13 ENCOUNTER — Encounter: Payer: Self-pay | Admitting: Family

## 2015-07-13 ENCOUNTER — Ambulatory Visit (INDEPENDENT_AMBULATORY_CARE_PROVIDER_SITE_OTHER): Payer: Medicare Other | Admitting: Family

## 2015-07-13 VITALS — BP 138/73 | HR 106 | Temp 101.5°F | Ht 62.0 in | Wt 157.0 lb

## 2015-07-13 DIAGNOSIS — J0100 Acute maxillary sinusitis, unspecified: Secondary | ICD-10-CM | POA: Diagnosis not present

## 2015-07-13 MED ORDER — FLUTICASONE PROPIONATE 50 MCG/ACT NA SUSP: 2.0000 | Freq: Every day | NASAL | Status: DC

## 2015-07-13 MED ORDER — AMOXICILLIN-POT CLAVULANATE 875-125 MG PO TABS: 1.0000 | ORAL_TABLET | Freq: Two times a day (BID) | ORAL | Status: DC

## 2015-07-13 NOTE — Patient Instructions (Signed)
Sinusitis, Adult Sinusitis is redness, soreness, and inflammation of the paranasal sinuses. Paranasal sinuses are air pockets within the bones of your face. They are located beneath your eyes, in the middle of your forehead, and above your eyes. In healthy paranasal sinuses, mucus is able to drain out, and air is able to circulate through them by way of your nose. However, when your paranasal sinuses are inflamed, mucus and air can become trapped. This can allow bacteria and other germs to grow and cause infection. Sinusitis can develop quickly and last only a short time (acute) or continue over a long period (chronic). Sinusitis that lasts for more than 12 weeks is considered chronic. CAUSES Causes of sinusitis include:  Allergies.  Structural abnormalities, such as displacement of the cartilage that separates your nostrils (deviated septum), which can decrease the air flow through your nose and sinuses and affect sinus drainage.  Functional abnormalities, such as when the small hairs (cilia) that line your sinuses and help remove mucus do not work properly or are not present. SIGNS AND SYMPTOMS Symptoms of acute and chronic sinusitis are the same. The primary symptoms are pain and pressure around the affected sinuses. Other symptoms include:  Upper toothache.  Earache.  Headache.  Bad breath.  Decreased sense of smell and taste.  A cough, which worsens when you are lying flat.  Fatigue.  Fever.  Thick drainage from your nose, which often is green and may contain pus (purulent).  Swelling and warmth over the affected sinuses. DIAGNOSIS Your health care provider will perform a physical exam. During your exam, your health care provider may perform any of the following to help determine if you have acute sinusitis or chronic sinusitis:  Look in your nose for signs of abnormal growths in your nostrils (nasal polyps).  Tap over the affected sinus to check for signs of  infection.  View the inside of your sinuses using an imaging device that has a light attached (endoscope). If your health care provider suspects that you have chronic sinusitis, one or more of the following tests may be recommended:  Allergy tests.  Nasal culture. A sample of mucus is taken from your nose, sent to a lab, and screened for bacteria.  Nasal cytology. A sample of mucus is taken from your nose and examined by your health care provider to determine if your sinusitis is related to an allergy. TREATMENT Most cases of acute sinusitis are related to a viral infection and will resolve on their own within 10 days. Sometimes, medicines are prescribed to help relieve symptoms of both acute and chronic sinusitis. These may include pain medicines, decongestants, nasal steroid sprays, or saline sprays. However, for sinusitis related to a bacterial infection, your health care provider will prescribe antibiotic medicines. These are medicines that will help kill the bacteria causing the infection. Rarely, sinusitis is caused by a fungal infection. In these cases, your health care provider will prescribe antifungal medicine. For some cases of chronic sinusitis, surgery is needed. Generally, these are cases in which sinusitis recurs more than 3 times per year, despite other treatments. HOME CARE INSTRUCTIONS  Drink plenty of water. Water helps thin the mucus so your sinuses can drain more easily.  Use a humidifier.  Inhale steam 3-4 times a day (for example, sit in the bathroom with the shower running).  Apply a warm, moist washcloth to your face 3-4 times a day, or as directed by your health care provider.  Use saline nasal sprays to help   moisten and clean your sinuses.  Take medicines only as directed by your health care provider.  If you were prescribed either an antibiotic or antifungal medicine, finish it all even if you start to feel better. SEEK IMMEDIATE MEDICAL CARE IF:  You have  increasing pain or severe headaches.  You have nausea, vomiting, or drowsiness.  You have swelling around your face.  You have vision problems.  You have a stiff neck.  You have difficulty breathing.   This information is not intended to replace advice given to you by your health care provider. Make sure you discuss any questions you have with your health care provider.   Document Released: 07/09/2005 Document Revised: 07/30/2014 Document Reviewed: 07/24/2011 Elsevier Interactive Patient Education 2016 Else[employer]  - Take meds as prescribed - Use a cool mist humidifier  -Use saline nose sprays frequently -Saline irrigations of the nose can be very helpful if done frequently.  * 4X daily for 1 week*  * Use of a nettie pot can be helpful with this. Follow directions with this* -Force fluids -For any cough or congestion  Use plain Mucinex- regular strength or max strength is fine   * Children- consult with Pharmacist for dosing -For fever or aces or pains- take tylenol or ibuprofen appropriate for age and weight.  * for fevers greater than 101 orally you may alternate ibuprofen and tylenol every  3 hours. -Throat lozenges if help   Akul Leggette, FNP   

## 2015-07-13 NOTE — Progress Notes (Signed)
Subjective:    Patient ID: Christie Bruce, female    DOB: Sep 03, 1940, 74 y.o.   MRN: [id number]  Sinus Problem This is a new problem. The current episode started 1 to 4 weeks ago. The problem has been gradually worsening since onset. The maximum temperature recorded prior to her arrival was 100.4 - 100.9 F. Her pain is at a severity of 4/10. The pain is moderate. Associated symptoms include congestion, coughing, ear pain, a hoarse voice, sinus pressure, sneezing and a sore throat. Pertinent negatives include no headaches, shortness of breath or swollen glands. Past treatments include nothing. The treatment provided no relief.  Fever  Associated symptoms include congestion, coughing, ear pain and a sore throat. Pertinent negatives include no headaches.      Review of Systems  Constitutional: Positive for fever.  HENT: Positive for congestion, ear pain, hoarse voice, sinus pressure, sneezing and sore throat.   Eyes: Negative.   Respiratory: Positive for cough. Negative for shortness of breath.   Cardiovascular: Negative.  Negative for palpitations.  Gastrointestinal: Negative.   Endocrine: Negative.   Genitourinary: Negative.   Musculoskeletal: Negative.   Neurological: Negative.  Negative for headaches.  Hematological: Negative.   Psychiatric/Behavioral: Negative.   All other systems reviewed and are negative.      Objective:   Physical Exam  Constitutional: She is oriented to person, place, and time. She appears well-developed and well-nourished. No distress.  HENT:  Head: Normocephalic and atraumatic.  Right Ear: External ear normal.  Left Ear: External ear normal.  Nose: Right sinus exhibits maxillary sinus tenderness. Left sinus exhibits maxillary sinus tenderness.  Mouth/Throat: Oropharynx is clear and moist.  Nasal passage erythemas with mild swelling    Eyes: Pupils are equal, round, and reactive to light.  Neck: Normal range of motion. Neck supple. No thyromegaly  present.  Cardiovascular: Normal rate, regular rhythm and intact distal pulses.   Murmur heard. Pulmonary/Chest: Effort normal and breath sounds normal. No respiratory distress. She has no wheezes.  Abdominal: Soft. Bowel sounds are normal. She exhibits no distension. There is no tenderness.  Musculoskeletal: Normal range of motion. She exhibits no edema or tenderness.  Neurological: She is alert and oriented to person, place, and time. She has normal reflexes. No cranial nerve deficit.  Skin: Skin is warm and dry.  Psychiatric: She has a normal mood and affect. Her behavior is normal. Judgment and thought content normal.  Vitals reviewed.     BP 138/73 mmHg  Pulse 106  Temp(Src) 101.5 F (38.6 C) (Oral)  Ht 5\' 2"  (1.575 m)  Wt 157 lb (71.215 kg)  BMI 28.71 kg/m2     Assessment & Plan:  1. Acute maxillary sinusitis, recurrence not specified -- Take meds as prescribed - Use a cool mist humidifier  -Use saline nose sprays frequently -Saline irrigations of the nose can be very helpful if done frequently.  * 4X daily for 1 week*  * Use of a nettie pot can be helpful with this. Follow directions with this* -Force fluids -For any cough or congestion  Use plain Mucinex- regular strength or max strength is fine   * Children- consult with Pharmacist for dosing -For fever or aces or pains- take tylenol or ibuprofen appropriate for age and weight.  * for fevers greater than 101 orally you may alternate ibuprofen and tylenol every  3 hours. -Throat lozenges if help - amoxicillin-clavulanate (AUGMENTIN) 875-125 MG tablet; Take 1 tablet by mouth 2 (two) times daily.  Dispense:  14 tablet; Refill: 0 - fluticasone (FLONASE) 50 MCG/ACT nasal spray; Place 2 sprays into both nostrils daily.  Dispense: 16 g; Refill: [place], FNP

## 2015-07-19 ENCOUNTER — Other Ambulatory Visit: Payer: Self-pay | Admitting: Nurse Practitioner

## 2015-07-19 NOTE — Telephone Encounter (Signed)
Please call in xanax with 0 refills 

## 2015-07-19 NOTE — Telephone Encounter (Signed)
Last filled 06/21/15, last seen 05/13/15. [facility]

## 2015-07-21 NOTE — Telephone Encounter (Signed)
RX called into [facility] per Chevis Pretty

## 2015-08-20 ENCOUNTER — Other Ambulatory Visit: Payer: Self-pay | Admitting: Family Medicine

## 2015-09-20 ENCOUNTER — Other Ambulatory Visit: Payer: Self-pay | Admitting: Nurse Practitioner

## 2015-09-20 NOTE — Telephone Encounter (Signed)
Last seen 07/13/15  Christie Bruce   MMM PCP  If approved route to nurse to call into [facility]

## 2015-09-20 NOTE — Telephone Encounter (Signed)
Please call in xanax with 1 refills 

## 2015-09-21 NOTE — Telephone Encounter (Signed)
rx called into pharmacy

## 2015-10-17 ENCOUNTER — Ambulatory Visit (INDEPENDENT_AMBULATORY_CARE_PROVIDER_SITE_OTHER): Payer: Medicare Other | Admitting: Obstetrics & Gynecology

## 2015-10-17 ENCOUNTER — Encounter: Payer: Self-pay | Admitting: Obstetrics & Gynecology

## 2015-10-17 VITALS — BP 160/70 | HR 70 | Ht 62.5 in | Wt 158.5 lb

## 2015-10-17 DIAGNOSIS — N813 Complete uterovaginal prolapse: Secondary | ICD-10-CM | POA: Diagnosis not present

## 2015-10-17 MED ORDER — METRONIDAZOLE 500 MG PO TABS: 500.0000 mg | ORAL_TABLET | Freq: Two times a day (BID) | ORAL | Status: DC

## 2015-10-17 NOTE — Progress Notes (Signed)
Patient ID: Christie Bruce, female   DOB: 1941/06/30, 75 y.o.   MRN: [id number] Chief Complaint  Patient presents with  . Follow-up    on Pessary; pt has noticed an odor    Blood pressure 160/70, pulse 70, height 5' 2.5" (1.588 m), weight 158 lb 8 oz (71.895 kg).  Christie Bruce presents today for routine follow up related to her pessary.   She uses a Tax adviser #2.   She reports moderate vaginal discharge or vaginal bleeding. With odor  Exam  Reveals too much vaginal mucosal pressure Gelhorn removed and ordered a milex ring with support 2 and 3 to see which one works best, probably 3  The pessary is removed,   Christie Bruce will be sen back in 1 week for replacement  Florian Buff, MD   10/17/2015 9:53 AM  Meds ordered this encounter  Medications  . metroNIDAZOLE (FLAGYL) 500 MG tablet    Sig: Take 1 tablet (500 mg total) by mouth 2 (two) times daily.    Dispense:  14 tablet    Refill:  0

## 2015-10-24 ENCOUNTER — Ambulatory Visit: Payer: Medicare Other | Admitting: Obstetrics & Gynecology

## 2015-10-28 ENCOUNTER — Ambulatory Visit (INDEPENDENT_AMBULATORY_CARE_PROVIDER_SITE_OTHER): Payer: Medicare Other | Admitting: Obstetrics & Gynecology

## 2015-10-28 ENCOUNTER — Encounter: Payer: Self-pay | Admitting: Obstetrics & Gynecology

## 2015-10-28 VITALS — BP 144/78 | HR 70 | Ht 62.5 in | Wt 154.0 lb

## 2015-10-28 DIAGNOSIS — N813 Complete uterovaginal prolapse: Secondary | ICD-10-CM

## 2015-10-28 NOTE — Progress Notes (Signed)
Patient ID: Christie Bruce, female   DOB: [birth year], 75 y.o.   MRN: [id number] Patient ID: Christie Bruce, female   DOB: 21-Sep-1940, 75 y.o.   MRN: [id number]  Chief Complaint  Patient presents with  . Follow-up    place new pessary    Blood pressure 144/78, pulse 70, height 5' 2.5" (1.588 m), weight 154 lb (69.854 kg).  Here to place the new pessary after the Gelhorn was causing excessive vaginal mucosal pressure  Milex ring with support #2 placed without difficulty Odor and discharge are better s/p gelhorn out and po metronidazole  Follow up 2 weeks  Previous note: Chief Complaint  Patient presents with  . Follow-up    place new pessary    Blood pressure 144/78, pulse 70, height 5' 2.5" (1.588 m), weight 154 lb (69.854 kg).  Christie Bruce presents today for routine follow up related to her pessary.   She uses a Tax adviser #2.   She reports moderate vaginal discharge or vaginal bleeding. With odor  Exam  Reveals too much vaginal mucosal pressure Gelhorn removed and ordered a milex ring with support 2 and 3 to see which one works best, probably 3  The pessary is removed,   Christie Bruce will be sen back in 1 week for replacement  Florian Buff, MD   10/28/2015 9:39 AM  No orders of the defined types were placed in this encounter.

## 2015-11-08 DIAGNOSIS — H1045 Other chronic allergic conjunctivitis: Secondary | ICD-10-CM | POA: Diagnosis not present

## 2015-11-11 ENCOUNTER — Encounter: Payer: Self-pay | Admitting: Obstetrics & Gynecology

## 2015-11-11 ENCOUNTER — Ambulatory Visit (INDEPENDENT_AMBULATORY_CARE_PROVIDER_SITE_OTHER): Payer: Medicare Other | Admitting: Obstetrics & Gynecology

## 2015-11-11 VITALS — BP 130/70 | HR 60 | Wt 153.0 lb

## 2015-11-11 DIAGNOSIS — N813 Complete uterovaginal prolapse: Secondary | ICD-10-CM | POA: Diagnosis not present

## 2015-11-11 NOTE — Progress Notes (Signed)
Patient ID: Christie Bruce, female   DOB: [birth year], 75 y.o.   MRN: [id number] Chief Complaint  Patient presents with  . gyn visit    check/ clean pessary    Blood pressure 130/70, pulse 60, weight 153 lb (69.4 kg).  Christie Bruce presents today for routine follow up related to her pessary.   She uses a milex ring with support #2 She reports no vaginal discharge or vaginal bleeding.  Exam reveals no undue vaginal mucosal pressure of breakdown, no discharge and no vaginal bleeding.  The pessary is removed, cleaned and replaced without difficulty.    Christie Bruce will be sen back in 4 months for continued follow up.  Florian Buff  11/11/2015 9:33 AM

## 2015-11-22 ENCOUNTER — Other Ambulatory Visit: Payer: Self-pay | Admitting: Nurse Practitioner

## 2015-11-22 NOTE — Telephone Encounter (Signed)
Last seen 07/13/15 Christie Bruce   If approved route to nurse to call into [facility]

## 2015-11-22 NOTE — Telephone Encounter (Signed)
rx called into pharmacy

## 2015-12-08 ENCOUNTER — Encounter: Payer: Self-pay | Admitting: Family

## 2015-12-08 ENCOUNTER — Ambulatory Visit (INDEPENDENT_AMBULATORY_CARE_PROVIDER_SITE_OTHER): Payer: Medicare Other | Admitting: Family

## 2015-12-08 DIAGNOSIS — J0100 Acute maxillary sinusitis, unspecified: Secondary | ICD-10-CM

## 2015-12-08 MED ORDER — CETIRIZINE HCL 10 MG PO TABS: 10.0000 mg | ORAL_TABLET | Freq: Every day | ORAL | Status: DC

## 2015-12-08 MED ORDER — FLUTICASONE PROPIONATE 50 MCG/ACT NA SUSP: 2.0000 | Freq: Every day | NASAL | Status: DC

## 2015-12-08 MED ORDER — AMOXICILLIN-POT CLAVULANATE 875-125 MG PO TABS: 1.0000 | ORAL_TABLET | Freq: Two times a day (BID) | ORAL | Status: DC

## 2015-12-08 NOTE — Progress Notes (Signed)
Subjective:    Patient ID: Christie Bruce, female    DOB: [birth year], 75 y.o.   MRN: [id number]  Dizziness This is a new problem. The current episode started in the past 7 days. Associated symptoms include congestion, coughing and headaches. Pertinent negatives include no chills or sore throat.  Sinus Problem This is a new problem. The current episode started in the past 7 days. The problem has been gradually worsening since onset. There has been no fever. Her pain is at a severity of 4/10. The pain is mild. Associated symptoms include congestion, coughing, ear pain, headaches, a hoarse voice, sinus pressure and sneezing. Pertinent negatives include no chills, shortness of breath or sore throat. Past treatments include spray decongestants. The treatment provided mild relief.      Review of Systems  Constitutional: Negative for chills.  HENT: Positive for congestion, ear pain, hoarse voice, sinus pressure and sneezing. Negative for sore throat.   Respiratory: Positive for cough. Negative for shortness of breath.   Neurological: Positive for dizziness and headaches.  All other systems reviewed and are negative.      Objective:   Physical Exam  Constitutional: She is oriented to person, place, and time. She appears well-developed and well-nourished. No distress.  HENT:  Head: Normocephalic and atraumatic.  Right Ear: External ear normal.  Left Ear: External ear normal.  Nose: Mucosal edema and rhinorrhea present. Right sinus exhibits frontal sinus tenderness. Left sinus exhibits frontal sinus tenderness.  Eyes: Pupils are equal, round, and reactive to light.  Neck: Normal range of motion. Neck supple. No thyromegaly present.  Cardiovascular: Normal rate, regular rhythm and intact distal pulses.   Murmur heard. Pulmonary/Chest: Effort normal and breath sounds normal. No respiratory distress. She has no wheezes.  Abdominal: Soft. Bowel sounds are normal. She exhibits no distension. There  is no tenderness.  Musculoskeletal: Normal range of motion. She exhibits no edema or tenderness.  Neurological: She is alert and oriented to person, place, and time.  Skin: Skin is warm and dry.  Psychiatric: She has a normal mood and affect. Her behavior is normal. Judgment and thought content normal.  Vitals reviewed.     BP 137/73 mmHg  Pulse 77  Temp(Src) 97.3 F (36.3 C) (Oral)  Ht 5' 2.5" (1.588 m)  Wt 152 lb 9.6 oz (69.219 kg)  BMI 27.45 kg/m2     Assessment & Plan:  1. Acute maxillary sinusitis, recurrence not specified -- Take meds as prescribed - Use a cool mist humidifier  -Use saline nose sprays frequently -Saline irrigations of the nose can be very helpful if done frequently.  * 4X daily for 1 week*  * Use of a nettie pot can be helpful with this. Follow directions with this* -Force fluids -For any cough or congestion  Use plain Mucinex- regular strength or max strength is fine   * Children- consult with Pharmacist for dosing -For fever or aces or pains- take tylenol or ibuprofen appropriate for age and weight.  * for fevers greater than 101 orally you may alternate ibuprofen and tylenol every  3 hours. -Throat lozenges if helps - amoxicillin-clavulanate (AUGMENTIN) 875-125 MG tablet; Take 1 tablet by mouth 2 (two) times daily.  Dispense: 14 tablet; Refill: 0 - fluticasone (FLONASE) 50 MCG/ACT nasal spray; Place 2 sprays into both nostrils daily.  Dispense: 16 g; Refill: 6 - cetirizine (ZYRTEC) 10 MG tablet; Take 1 tablet (10 mg total) by mouth daily.  Dispense: 30 tablet; Refill: [place]  Lenna Gilford, [place]

## 2015-12-08 NOTE — Patient Instructions (Signed)
Sinusitis, Adult Sinusitis is redness, soreness, and inflammation of the paranasal sinuses. Paranasal sinuses are air pockets within the bones of your face. They are located beneath your eyes, in the middle of your forehead, and above your eyes. In healthy paranasal sinuses, mucus is able to drain out, and air is able to circulate through them by way of your nose. However, when your paranasal sinuses are inflamed, mucus and air can become trapped. This can allow bacteria and other germs to grow and cause infection. Sinusitis can develop quickly and last only a short time (acute) or continue over a long period (chronic). Sinusitis that lasts for more than 12 weeks is considered chronic. CAUSES Causes of sinusitis include:  Allergies.  Structural abnormalities, such as displacement of the cartilage that separates your nostrils (deviated septum), which can decrease the air flow through your nose and sinuses and affect sinus drainage.  Functional abnormalities, such as when the small hairs (cilia) that line your sinuses and help remove mucus do not work properly or are not present. SIGNS AND SYMPTOMS Symptoms of acute and chronic sinusitis are the same. The primary symptoms are pain and pressure around the affected sinuses. Other symptoms include:  Upper toothache.  Earache.  Headache.  Bad breath.  Decreased sense of smell and taste.  A cough, which worsens when you are lying flat.  Fatigue.  Fever.  Thick drainage from your nose, which often is green and may contain pus (purulent).  Swelling and warmth over the affected sinuses. DIAGNOSIS Your health care provider will perform a physical exam. During your exam, your health care provider may perform any of the following to help determine if you have acute sinusitis or chronic sinusitis:  Look in your nose for signs of abnormal growths in your nostrils (nasal polyps).  Tap over the affected sinus to check for signs of  infection.  View the inside of your sinuses using an imaging device that has a light attached (endoscope). If your health care provider suspects that you have chronic sinusitis, one or more of the following tests may be recommended:  Allergy tests.  Nasal culture. A sample of mucus is taken from your nose, sent to a lab, and screened for bacteria.  Nasal cytology. A sample of mucus is taken from your nose and examined by your health care provider to determine if your sinusitis is related to an allergy. TREATMENT Most cases of acute sinusitis are related to a viral infection and will resolve on their own within 10 days. Sometimes, medicines are prescribed to help relieve symptoms of both acute and chronic sinusitis. These may include pain medicines, decongestants, nasal steroid sprays, or saline sprays. However, for sinusitis related to a bacterial infection, your health care provider will prescribe antibiotic medicines. These are medicines that will help kill the bacteria causing the infection. Rarely, sinusitis is caused by a fungal infection. In these cases, your health care provider will prescribe antifungal medicine. For some cases of chronic sinusitis, surgery is needed. Generally, these are cases in which sinusitis recurs more than 3 times per year, despite other treatments. HOME CARE INSTRUCTIONS  Drink plenty of water. Water helps thin the mucus so your sinuses can drain more easily.  Use a humidifier.  Inhale steam 3-4 times a day (for example, sit in the bathroom with the shower running).  Apply a warm, moist washcloth to your face 3-4 times a day, or as directed by your health care provider.  Use saline nasal sprays to help   moisten and clean your sinuses.  Take medicines only as directed by your health care provider.  If you were prescribed either an antibiotic or antifungal medicine, finish it all even if you start to feel better. SEEK IMMEDIATE MEDICAL CARE IF:  You have  increasing pain or severe headaches.  You have nausea, vomiting, or drowsiness.  You have swelling around your face.  You have vision problems.  You have a stiff neck.  You have difficulty breathing.   This information is not intended to replace advice given to you by your health care provider. Make sure you discuss any questions you have with your health care provider.   Document Released: 07/09/2005 Document Revised: 07/30/2014 Document Reviewed: 07/24/2011 Elsevier Interactive Patient Education 2016 [employer]  - Take meds as prescribed - Use a cool mist humidifier  -Use saline nose sprays frequently -Saline irrigations of the nose can be very helpful if done frequently.  * 4X daily for 1 week*  * Use of a nettie pot can be helpful with this. Follow directions with this* -Force fluids -For any cough or congestion  Use plain Mucinex- regular strength or max strength is fine   * Children- consult with Pharmacist for dosing -For fever or aces or pains- take tylenol or ibuprofen appropriate for age and weight.  * for fevers greater than 101 orally you may alternate ibuprofen and tylenol every  3 hours. -Throat lozenges if help   Christy Hawks, FNP   

## 2015-12-27 ENCOUNTER — Emergency Department (HOSPITAL_COMMUNITY): Payer: Medicare Other

## 2015-12-27 ENCOUNTER — Encounter (HOSPITAL_COMMUNITY): Payer: Self-pay | Admitting: Emergency Medicine

## 2015-12-27 ENCOUNTER — Other Ambulatory Visit: Payer: Self-pay | Admitting: Nurse Practitioner

## 2015-12-27 ENCOUNTER — Inpatient Hospital Stay (HOSPITAL_COMMUNITY)
Admission: EM | Admit: 2015-12-27 | Discharge: 2015-12-30 | DRG: 482 | Disposition: A | Discharge status: Skilled Nursing Facility | Payer: Medicare Other | Attending: Family Medicine | Admitting: Family Medicine

## 2015-12-27 DIAGNOSIS — Z853 Personal history of malignant neoplasm of breast: Secondary | ICD-10-CM

## 2015-12-27 DIAGNOSIS — I10 Essential (primary) hypertension: Secondary | ICD-10-CM | POA: Diagnosis not present

## 2015-12-27 DIAGNOSIS — N814 Uterovaginal prolapse, unspecified: Secondary | ICD-10-CM | POA: Diagnosis present

## 2015-12-27 DIAGNOSIS — Y92000 Kitchen of unspecified non-institutional (private) residence as  the place of occurrence of the external cause: Secondary | ICD-10-CM

## 2015-12-27 DIAGNOSIS — J45909 Unspecified asthma, uncomplicated: Secondary | ICD-10-CM | POA: Diagnosis present

## 2015-12-27 DIAGNOSIS — S72002A Fracture of unspecified part of neck of left femur, initial encounter for closed fracture: Secondary | ICD-10-CM | POA: Diagnosis not present

## 2015-12-27 DIAGNOSIS — Z79899 Other long term (current) drug therapy: Secondary | ICD-10-CM

## 2015-12-27 DIAGNOSIS — R079 Chest pain, unspecified: Secondary | ICD-10-CM | POA: Diagnosis not present

## 2015-12-27 DIAGNOSIS — R42 Dizziness and giddiness: Secondary | ICD-10-CM | POA: Diagnosis not present

## 2015-12-27 DIAGNOSIS — N183 Chronic kidney disease, stage 3 unspecified: Secondary | ICD-10-CM | POA: Diagnosis present

## 2015-12-27 DIAGNOSIS — N1832 Chronic kidney disease, stage 3b: Secondary | ICD-10-CM | POA: Diagnosis present

## 2015-12-27 DIAGNOSIS — S72142A Displaced intertrochanteric fracture of left femur, initial encounter for closed fracture: Secondary | ICD-10-CM | POA: Diagnosis not present

## 2015-12-27 DIAGNOSIS — Z87891 Personal history of nicotine dependence: Secondary | ICD-10-CM | POA: Diagnosis not present

## 2015-12-27 DIAGNOSIS — F419 Anxiety disorder, unspecified: Secondary | ICD-10-CM | POA: Diagnosis present

## 2015-12-27 DIAGNOSIS — Z9013 Acquired absence of bilateral breasts and nipples: Secondary | ICD-10-CM

## 2015-12-27 DIAGNOSIS — I129 Hypertensive chronic kidney disease with stage 1 through stage 4 chronic kidney disease, or unspecified chronic kidney disease: Secondary | ICD-10-CM | POA: Diagnosis present

## 2015-12-27 DIAGNOSIS — J0100 Acute maxillary sinusitis, unspecified: Secondary | ICD-10-CM

## 2015-12-27 DIAGNOSIS — K219 Gastro-esophageal reflux disease without esophagitis: Secondary | ICD-10-CM | POA: Diagnosis present

## 2015-12-27 DIAGNOSIS — W19XXXA Unspecified fall, initial encounter: Secondary | ICD-10-CM | POA: Diagnosis present

## 2015-12-27 DIAGNOSIS — Z419 Encounter for procedure for purposes other than remedying health state, unspecified: Secondary | ICD-10-CM

## 2015-12-27 DIAGNOSIS — S299XXA Unspecified injury of thorax, initial encounter: Secondary | ICD-10-CM | POA: Diagnosis not present

## 2015-12-27 DIAGNOSIS — S7292XA Unspecified fracture of left femur, initial encounter for closed fracture: Secondary | ICD-10-CM | POA: Diagnosis present

## 2015-12-27 DIAGNOSIS — R52 Pain, unspecified: Secondary | ICD-10-CM | POA: Diagnosis not present

## 2015-12-27 DIAGNOSIS — D649 Anemia, unspecified: Secondary | ICD-10-CM | POA: Diagnosis present

## 2015-12-27 DIAGNOSIS — S32040A Wedge compression fracture of fourth lumbar vertebra, initial encounter for closed fracture: Secondary | ICD-10-CM | POA: Diagnosis not present

## 2015-12-27 DIAGNOSIS — S72145A Nondisplaced intertrochanteric fracture of left femur, initial encounter for closed fracture: Secondary | ICD-10-CM | POA: Diagnosis not present

## 2015-12-27 DIAGNOSIS — M25552 Pain in left hip: Secondary | ICD-10-CM | POA: Diagnosis not present

## 2015-12-27 HISTORY — DX: Nausea with vomiting, unspecified: R11.2

## 2015-12-27 HISTORY — DX: Other specified postprocedural states: Z98.890

## 2015-12-27 NOTE — ED Provider Notes (Signed)
CSN: [id number]     Arrival date & time 12/27/15  2220 History   First MD Initiated Contact with Patient 12/27/15 2237     Chief Complaint  Patient presents with  . Hip Pain  . Fall     (Consider location/radiation/quality/duration/timing/severity/associated sxs/prior Treatment) HPI   Christie Bruce is a 75 year old female with a past medical history of CKD, HTN, breast cancer who presents the ED to be evaluated after a fall. Patient states she was standing in her kitchen and felt "dizzy spell". She states she got off balance and fell backwards striking her left hip on the corner of her cabinet. She lowered herself to the floor but was unable to get up and unable to move her left leg due to pain. She denies hitting her head or losing consciousness. She is not complaining of pain in her left hip, back, left ribs. Patient states she's been getting intermittent dizzy spells for the last 2-3 months. She has a history of vertigo and states this feels similar. She reports that the room feels like it spinning around her but last only briefly and then resolves. She states she's been having "sinus problems" for the last couple of months has been going to her doctor for this. She's been given antibiotics and cough medication which has not really improved her symptoms. She denies any chest pain, shortness of breath, blurry vision, headache, or paresthesias. She is not on anticoagulants.   Past Medical History  Diagnosis Date  . Breast cancer ([place])   . Hypertension   . GERD (gastroesophageal reflux disease)   . Uterine prolapse   . 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 GERD (gastroesophageal reflux disease) 09/12/2011  . Heart murmur   . Chronic kidney disease    Past Surgical History  Procedure Laterality Date  . Bilateral mastectomy  07/19/10    bilat mastectomy for DCIS  . Knee surgery Left   . Wrist surgery Left    Family History  Problem Relation Age of Onset  . Cancer  Mother     melanoma  . Stroke Mother   . Cancer Father     prostate cancer  . Heart disease Father   . Hypertension Father   . Cancer Brother     lung cancer  . Cancer Paternal Uncle     lung cancer  . Hypertension Sister   . Heart attack Brother   . Diabetes Brother   . Diabetes Brother    Social History  Substance Use Topics  . Smoking status: Former Smoker -- 15 years    Types: Cigarettes  . Smokeless tobacco: Never Used  . Alcohol Use: No   OB History    Gravida Para Term Preterm AB TAB SAB Ectopic Multiple Living   3 3        3      Review of Systems  All other systems reviewed and are negative.     Allergies  Celebrex and Sulfa antibiotics  Home Medications   Prior to Admission medications   Medication Sig Start Date End Date Taking? Authorizing Provider  albuterol (PROAIR HFA) 108 (90 BASE) MCG/ACT inhaler Inhale 2 puffs into the lungs every 6 (six) hours as needed for wheezing or shortness of breath. 07/28/13   Erby Pian, FNP  ALPRAZolam Duanne Moron) 0.25 MG tablet TAKE ONE TABLET TWICE DAILY AS NEEDED 12/27/15   Mary-Margaret Hassell Done, FNP  amoxicillin-clavulanate (AUGMENTIN) 875-125 MG tablet Take 1 tablet by mouth 2 (  two) times daily. 12/08/15   Sharion Balloon, FNP  cetirizine (ZYRTEC) 10 MG tablet Take 1 tablet (10 mg total) by mouth daily. 12/08/15   Sharion Balloon, FNP  fluticasone (FLONASE) 50 MCG/ACT nasal spray Place 2 sprays into both nostrils daily. 12/08/15   Sharion Balloon, FNP  lisinopril-hydrochlorothiazide (PRINZIDE,ZESTORETIC) 20-25 MG tablet TAKE ONE (1) TABLET EACH DAY 11/22/15   Sharion Balloon, FNP  meloxicam (MOBIC) 7.5 MG tablet TAKE ONE (1) TABLET EACH DAY 11/22/15   Sharion Balloon, FNP  omeprazole (PRILOSEC) 40 MG capsule Take 1 capsule (40 mg total) by mouth daily. Patient taking differently: Take 40 mg by mouth as needed.  05/13/15   Mary-Margaret Hassell Done, FNP   BP 165/63 mmHg  Pulse 96  Temp(Src) 97.8 F (36.6 C) (Oral)  Resp 19  SpO2  96% Physical Exam  Constitutional: She is oriented to person, place, and time. No distress.  Elderly  HENT:  Head: Normocephalic and atraumatic.  Nose: Nose normal.  Mouth/Throat: No oropharyngeal exudate.  Eyes: Conjunctivae and EOM are normal. Pupils are equal, round, and reactive to light. Right eye exhibits no discharge. Left eye exhibits no discharge. No scleral icterus.  Cardiovascular: Normal rate, regular rhythm, normal heart sounds and intact distal pulses.  Exam reveals no gallop and no friction rub.   No murmur heard. Pulmonary/Chest: Effort normal and breath sounds normal. No respiratory distress. She has no wheezes. She has no rales. She exhibits no tenderness.  Abdominal: Soft. She exhibits no distension. There is no tenderness. There is no guarding.  Musculoskeletal: Normal range of motion. She exhibits no edema.  Left hip is laterally and externally rotated and significantly shorter than the right leg. Patient unable to move left leg. Significant TTP over left hip was obvious deformity present.  No midline spinal tenderness. No step-offs or obvious bony deformities. Mild TTP over left eighth and ninth ribs in the mid axillary line.  Neurological: She is alert and oriented to person, place, and time. No cranial nerve deficit.  Strength 5/5 throughout. No sensory deficits.  No dysmetria.  Skin: Skin is warm and dry. No rash noted. She is not diaphoretic. No erythema. No pallor.  Psychiatric: She has a normal mood and affect. Her behavior is normal.  Nursing note and vitals reviewed.   ED Course  Procedures (including critical care time) Labs Review Labs Reviewed  BASIC METABOLIC PANEL  CBC WITH DIFFERENTIAL/PLATELET    Imaging Review No results found. I have personally reviewed and evaluated these images and lab results as part of my medical decision-making.   EKG Interpretation None      MDM   Final diagnoses:  Hip fracture, left, closed, initial encounter  [facility])   75 year old female presents the ED to be evaluated after a fall. Patient is alert and oriented 4 and in no apparent distress. There is obvious bony deformity of her left hip. She is neurovascularly intact. Patient denies hitting her head or losing consciousness. No anticoagulation. X-ray left hip reveals an impacted intertrochanteric femur fracture.  1:02 AM Spoke with Dr. Percell Miller with orthopedics who recommends a medicine admission. We'll keep patient NPO and ortho will consult pt in the morning.   Patient states that she felt dizzy, like the room was spinning prior to her fall. She has been having intermittent spells of dizziness on and off for the last 3 months. She has history of vertigo and states this feels similar. She also reports having "sinus problems" for the  last 3 months. She has seen her PCP for these issues. Last seen by her PCP on 5/18 and was prescribed Augmentin and Zyrtec which she states have not relieved her symptoms. CT head negative for any acute abnormality. Hemoglobin mildly low at 9.7. Creatinine is 1.25, this appears to patient's baseline. Suspect patient's dizziness was likely related to inner ear etiology. I spoke with hospitalist who will admit patient to their service. Patient's pain was managed in the ED and she'll be kept NPO. Patient is currently hemodynamically stable and awaiting hospital bed.  Patient was discussed with and seen by Dr. Regenia Skeeter who agrees with the treatment plan.      Dondra Spry [place], PA-C 12/28/15 0540  Sherwood Gambler, MD 01/02/16 [phone]

## 2015-12-27 NOTE — ED Notes (Signed)
Patient transported to X-ray 

## 2015-12-27 NOTE — ED Notes (Signed)
Pt arrives by [facility] from home post fall. Pt was standing, got dizzy and fell backwards, striking back on a counter and then her bottom on the floor. C/O left hip and pelvic pain. EMS noted shortening of the left leg and lateral rotation. Placed 22g in left forearm, 10mg  morphine given. Last vitals: 152/70, HR 92, RR16, 97% on RA.

## 2015-12-27 NOTE — Telephone Encounter (Signed)
rx called into pharmacy

## 2015-12-27 NOTE — Telephone Encounter (Signed)
Last seen 12/08/15 Christie Bruce   MMM PCP  IF approved route to [facility]

## 2015-12-27 NOTE — Telephone Encounter (Signed)
Please call in xanax with 1 refills 

## 2015-12-28 ENCOUNTER — Inpatient Hospital Stay (HOSPITAL_COMMUNITY): Payer: Medicare Other | Admitting: Anesthesiology

## 2015-12-28 ENCOUNTER — Encounter (HOSPITAL_COMMUNITY)
Admission: EM | Disposition: A | Discharge status: Skilled Nursing Facility | Payer: Self-pay | Source: Home / Self Care | Attending: Family Medicine

## 2015-12-28 ENCOUNTER — Inpatient Hospital Stay (HOSPITAL_COMMUNITY): Payer: Medicare Other

## 2015-12-28 ENCOUNTER — Encounter (HOSPITAL_COMMUNITY): Payer: Self-pay | Admitting: General Practice

## 2015-12-28 DIAGNOSIS — N1832 Chronic kidney disease, stage 3b: Secondary | ICD-10-CM | POA: Diagnosis present

## 2015-12-28 DIAGNOSIS — S72145D Nondisplaced intertrochanteric fracture of left femur, subsequent encounter for closed fracture with routine healing: Secondary | ICD-10-CM | POA: Diagnosis not present

## 2015-12-28 DIAGNOSIS — S72145A Nondisplaced intertrochanteric fracture of left femur, initial encounter for closed fracture: Secondary | ICD-10-CM | POA: Diagnosis not present

## 2015-12-28 DIAGNOSIS — Z9013 Acquired absence of bilateral breasts and nipples: Secondary | ICD-10-CM | POA: Diagnosis not present

## 2015-12-28 DIAGNOSIS — F419 Anxiety disorder, unspecified: Secondary | ICD-10-CM | POA: Diagnosis present

## 2015-12-28 DIAGNOSIS — R259 Unspecified abnormal involuntary movements: Secondary | ICD-10-CM | POA: Diagnosis not present

## 2015-12-28 DIAGNOSIS — S728X9A Other fracture of unspecified femur, initial encounter for closed fracture: Secondary | ICD-10-CM | POA: Diagnosis not present

## 2015-12-28 DIAGNOSIS — N814 Uterovaginal prolapse, unspecified: Secondary | ICD-10-CM | POA: Diagnosis present

## 2015-12-28 DIAGNOSIS — I10 Essential (primary) hypertension: Secondary | ICD-10-CM

## 2015-12-28 DIAGNOSIS — D649 Anemia, unspecified: Secondary | ICD-10-CM | POA: Diagnosis present

## 2015-12-28 DIAGNOSIS — J45909 Unspecified asthma, uncomplicated: Secondary | ICD-10-CM | POA: Diagnosis present

## 2015-12-28 DIAGNOSIS — W19XXXA Unspecified fall, initial encounter: Secondary | ICD-10-CM | POA: Diagnosis not present

## 2015-12-28 DIAGNOSIS — R279 Unspecified lack of coordination: Secondary | ICD-10-CM | POA: Diagnosis not present

## 2015-12-28 DIAGNOSIS — N183 Chronic kidney disease, stage 3 unspecified: Secondary | ICD-10-CM | POA: Diagnosis present

## 2015-12-28 DIAGNOSIS — J0100 Acute maxillary sinusitis, unspecified: Secondary | ICD-10-CM | POA: Diagnosis not present

## 2015-12-28 DIAGNOSIS — Y92000 Kitchen of unspecified non-institutional (private) residence as  the place of occurrence of the external cause: Secondary | ICD-10-CM | POA: Diagnosis not present

## 2015-12-28 DIAGNOSIS — Z87891 Personal history of nicotine dependence: Secondary | ICD-10-CM | POA: Diagnosis not present

## 2015-12-28 DIAGNOSIS — S7292XA Unspecified fracture of left femur, initial encounter for closed fracture: Secondary | ICD-10-CM | POA: Diagnosis present

## 2015-12-28 DIAGNOSIS — M6281 Muscle weakness (generalized): Secondary | ICD-10-CM | POA: Diagnosis not present

## 2015-12-28 DIAGNOSIS — Z419 Encounter for procedure for purposes other than remedying health state, unspecified: Secondary | ICD-10-CM | POA: Diagnosis not present

## 2015-12-28 DIAGNOSIS — S72009A Fracture of unspecified part of neck of unspecified femur, initial encounter for closed fracture: Secondary | ICD-10-CM | POA: Insufficient documentation

## 2015-12-28 DIAGNOSIS — K219 Gastro-esophageal reflux disease without esophagitis: Secondary | ICD-10-CM | POA: Diagnosis not present

## 2015-12-28 DIAGNOSIS — R42 Dizziness and giddiness: Secondary | ICD-10-CM | POA: Diagnosis not present

## 2015-12-28 DIAGNOSIS — I129 Hypertensive chronic kidney disease with stage 1 through stage 4 chronic kidney disease, or unspecified chronic kidney disease: Secondary | ICD-10-CM | POA: Diagnosis not present

## 2015-12-28 DIAGNOSIS — M25552 Pain in left hip: Secondary | ICD-10-CM | POA: Diagnosis not present

## 2015-12-28 DIAGNOSIS — S72002A Fracture of unspecified part of neck of left femur, initial encounter for closed fracture: Secondary | ICD-10-CM | POA: Diagnosis not present

## 2015-12-28 DIAGNOSIS — S72142A Displaced intertrochanteric fracture of left femur, initial encounter for closed fracture: Secondary | ICD-10-CM | POA: Diagnosis not present

## 2015-12-28 DIAGNOSIS — R262 Difficulty in walking, not elsewhere classified: Secondary | ICD-10-CM | POA: Diagnosis not present

## 2015-12-28 DIAGNOSIS — Z79899 Other long term (current) drug therapy: Secondary | ICD-10-CM | POA: Diagnosis not present

## 2015-12-28 DIAGNOSIS — Z853 Personal history of malignant neoplasm of breast: Secondary | ICD-10-CM | POA: Diagnosis not present

## 2015-12-28 HISTORY — PX: INTRAMEDULLARY (IM) NAIL INTERTROCHANTERIC: SHX5875

## 2015-12-28 LAB — CBC WITH DIFFERENTIAL/PLATELET
Basophils Absolute: 0 K/uL (ref 0.0–0.1)
Basophils Relative: 0 %
Eosinophils Absolute: 0 K/uL (ref 0.0–0.7)
Eosinophils Relative: 0 %
HCT: 31.9 % — ABNORMAL LOW (ref 36.0–46.0)
Hemoglobin: 9.7 g/dL — ABNORMAL LOW (ref 12.0–15.0)
Lymphocytes Relative: 11 %
Lymphs Abs: 1 K/uL (ref 0.7–4.0)
MCH: 28 pg (ref 26.0–34.0)
MCHC: 30.4 g/dL (ref 30.0–36.0)
MCV: 92.2 fL (ref 78.0–100.0)
Monocytes Absolute: 0.5 K/uL (ref 0.1–1.0)
Monocytes Relative: 5 %
Neutro Abs: 7.6 K/uL (ref 1.7–7.7)
Neutrophils Relative %: 84 %
Platelets: 158 K/uL (ref 150–400)
RBC: 3.46 MIL/uL — ABNORMAL LOW (ref 3.87–5.11)
RDW: 14.2 % (ref 11.5–15.5)
WBC: 9.1 K/uL (ref 4.0–10.5)

## 2015-12-28 LAB — ABO/RH: ABO/RH(D): O NEG

## 2015-12-28 LAB — BASIC METABOLIC PANEL WITH GFR
Anion gap: 7 (ref 5–15)
BUN: 19 mg/dL (ref 6–20)
CO2: 23 mmol/L (ref 22–32)
Calcium: 8.8 mg/dL — ABNORMAL LOW (ref 8.9–10.3)
Chloride: 110 mmol/L (ref 101–111)
Creatinine, Ser: 1.25 mg/dL — ABNORMAL HIGH (ref 0.44–1.00)
GFR calc Af Amer: 48 mL/min — ABNORMAL LOW
GFR calc non Af Amer: 41 mL/min — ABNORMAL LOW
Glucose, Bld: 185 mg/dL — ABNORMAL HIGH (ref 65–99)
Potassium: 4.5 mmol/L (ref 3.5–5.1)
Sodium: 140 mmol/L (ref 135–145)

## 2015-12-28 LAB — CREATININE, SERUM
CREATININE: 1.21 mg/dL — AB (ref 0.44–1.00)
GFR calc non Af Amer: 43 mL/min — ABNORMAL LOW (ref >60)
GFR, EST AFRICAN AMERICAN: 49 mL/min — AB (ref >60)

## 2015-12-28 LAB — PROTIME-INR
INR: 1.1 (ref 0.00–1.49)
Prothrombin Time: 14.4 seconds (ref 11.6–15.2)

## 2015-12-28 LAB — APTT: aPTT: 27 seconds (ref 24–37)

## 2015-12-28 LAB — TYPE AND SCREEN
ABO/RH(D): O NEG
Antibody Screen: NEGATIVE

## 2015-12-28 LAB — SURGICAL PCR SCREEN
MRSA, PCR: NEGATIVE
STAPHYLOCOCCUS AUREUS: NEGATIVE

## 2015-12-28 SURGERY — FIXATION, FRACTURE, INTERTROCHANTERIC, WITH INTRAMEDULLARY ROD
Anesthesia: General | Site: Hip | Laterality: Left

## 2015-12-28 MED ORDER — ACETAMINOPHEN 650 MG RE SUPP: 650.0000 mg | Freq: Four times a day (QID) | RECTAL | Status: DC | PRN

## 2015-12-28 MED ORDER — CEFAZOLIN SODIUM-DEXTROSE 2-4 GM/100ML-% IV SOLN
2.0000 g | Freq: Four times a day (QID) | INTRAVENOUS | Status: AC
  Administered 2015-12-28 – 2015-12-29 (×2): 2 g via INTRAVENOUS
  Filled 2015-12-28 (×2): qty 100

## 2015-12-28 MED ORDER — FENTANYL CITRATE (PF) 100 MCG/2ML IJ SOLN
INTRAMUSCULAR | Status: AC
  Filled 2015-12-28: qty 2

## 2015-12-28 MED ORDER — ENOXAPARIN SODIUM 40 MG/0.4ML ~~LOC~~ SOLN
40.0000 mg | SUBCUTANEOUS | Status: DC
  Administered 2015-12-29: 40 mg via SUBCUTANEOUS
  Filled 2015-12-28: qty 0.4

## 2015-12-28 MED ORDER — ONDANSETRON HCL 4 MG/2ML IJ SOLN
4.0000 mg | Freq: Four times a day (QID) | INTRAMUSCULAR | Status: DC | PRN
  Administered 2015-12-30: 4 mg via INTRAVENOUS
  Filled 2015-12-28: qty 2

## 2015-12-28 MED ORDER — NEOSTIGMINE METHYLSULFATE 5 MG/5ML IV SOSY
PREFILLED_SYRINGE | INTRAVENOUS | Status: AC
  Filled 2015-12-28: qty 5

## 2015-12-28 MED ORDER — SUCCINYLCHOLINE CHLORIDE 20 MG/ML IJ SOLN
INTRAMUSCULAR | Status: DC | PRN
  Administered 2015-12-28: 80 mg via INTRAVENOUS

## 2015-12-28 MED ORDER — ONDANSETRON HCL 4 MG/2ML IJ SOLN
INTRAMUSCULAR | Status: DC | PRN
  Administered 2015-12-28: 4 mg via INTRAVENOUS

## 2015-12-28 MED ORDER — FENTANYL CITRATE (PF) 250 MCG/5ML IJ SOLN
INTRAMUSCULAR | Status: AC
  Filled 2015-12-28: qty 5

## 2015-12-28 MED ORDER — LORAZEPAM 2 MG/ML IJ SOLN: 0.2500 mg | Freq: Two times a day (BID) | INTRAMUSCULAR | Status: DC | PRN

## 2015-12-28 MED ORDER — POVIDONE-IODINE 10 % EX SWAB: 2.0000 "application " | Freq: Once | CUTANEOUS | Status: DC

## 2015-12-28 MED ORDER — GLYCOPYRROLATE 0.2 MG/ML IV SOSY
PREFILLED_SYRINGE | INTRAVENOUS | Status: AC
  Filled 2015-12-28: qty 3

## 2015-12-28 MED ORDER — PHENYLEPHRINE HCL 10 MG/ML IJ SOLN
INTRAMUSCULAR | Status: DC | PRN
  Administered 2015-12-28: 120 ug via INTRAVENOUS

## 2015-12-28 MED ORDER — DEXTROSE-NACL 5-0.45 % IV SOLN: 100.0000 mL/h | INTRAVENOUS | Status: DC

## 2015-12-28 MED ORDER — ROCURONIUM BROMIDE 50 MG/5ML IV SOLN
INTRAVENOUS | Status: AC
  Filled 2015-12-28: qty 1

## 2015-12-28 MED ORDER — CEFAZOLIN SODIUM-DEXTROSE 2-4 GM/100ML-% IV SOLN
2.0000 g | INTRAVENOUS | Status: AC
  Administered 2015-12-28: 2 g via INTRAVENOUS
  Filled 2015-12-28: qty 100

## 2015-12-28 MED ORDER — LACTATED RINGERS IV SOLN
INTRAVENOUS | Status: DC
  Administered 2015-12-28 (×2): via INTRAVENOUS

## 2015-12-28 MED ORDER — LIDOCAINE 2% (20 MG/ML) 5 ML SYRINGE
INTRAMUSCULAR | Status: AC
  Filled 2015-12-28: qty 5

## 2015-12-28 MED ORDER — PHENYLEPHRINE 40 MCG/ML (10ML) SYRINGE FOR IV PUSH (FOR BLOOD PRESSURE SUPPORT)
PREFILLED_SYRINGE | INTRAVENOUS | Status: AC
  Filled 2015-12-28: qty 10

## 2015-12-28 MED ORDER — ONDANSETRON HCL 4 MG/2ML IJ SOLN: 4.0000 mg | Freq: Once | INTRAMUSCULAR | Status: DC | PRN

## 2015-12-28 MED ORDER — PROPOFOL 10 MG/ML IV BOLUS
INTRAVENOUS | Status: AC
  Filled 2015-12-28: qty 20

## 2015-12-28 MED ORDER — CHLORHEXIDINE GLUCONATE 4 % EX LIQD
60.0000 mL | Freq: Once | CUTANEOUS | Status: DC
  Filled 2015-12-28: qty 60

## 2015-12-28 MED ORDER — ROCURONIUM BROMIDE 100 MG/10ML IV SOLN
INTRAVENOUS | Status: DC | PRN
  Administered 2015-12-28: 30 mg via INTRAVENOUS

## 2015-12-28 MED ORDER — HYDROCODONE-ACETAMINOPHEN 5-325 MG PO TABS
1.0000 | ORAL_TABLET | Freq: Four times a day (QID) | ORAL | Status: DC | PRN
  Administered 2015-12-28 – 2015-12-30 (×7): 2 via ORAL
  Filled 2015-12-28 (×8): qty 2

## 2015-12-28 MED ORDER — PROPOFOL 10 MG/ML IV BOLUS
INTRAVENOUS | Status: DC | PRN
  Administered 2015-12-28: 30 mg via INTRAVENOUS
  Administered 2015-12-28: 50 mg via INTRAVENOUS
  Administered 2015-12-28: 40 mg via INTRAVENOUS

## 2015-12-28 MED ORDER — ONDANSETRON HCL 4 MG/2ML IJ SOLN
INTRAMUSCULAR | Status: AC
  Filled 2015-12-28: qty 2

## 2015-12-28 MED ORDER — IPRATROPIUM-ALBUTEROL 0.5-2.5 (3) MG/3ML IN SOLN: 3.0000 mL | RESPIRATORY_TRACT | Status: DC | PRN

## 2015-12-28 MED ORDER — EPHEDRINE 5 MG/ML INJ
INTRAVENOUS | Status: AC
  Filled 2015-12-28: qty 10

## 2015-12-28 MED ORDER — SODIUM CHLORIDE 0.9 % IV SOLN
INTRAVENOUS | Status: DC
  Administered 2015-12-28 – 2015-12-29 (×4): via INTRAVENOUS

## 2015-12-28 MED ORDER — GLYCOPYRROLATE 0.2 MG/ML IJ SOLN
INTRAMUSCULAR | Status: DC | PRN
  Administered 2015-12-28: 0.4 mg via INTRAVENOUS

## 2015-12-28 MED ORDER — NEOSTIGMINE METHYLSULFATE 10 MG/10ML IV SOLN
INTRAVENOUS | Status: DC | PRN
  Administered 2015-12-28: 1 mg via INTRAVENOUS
  Administered 2015-12-28: 3 mg via INTRAVENOUS

## 2015-12-28 MED ORDER — MENTHOL 3 MG MT LOZG: 1.0000 | LOZENGE | OROMUCOSAL | Status: DC | PRN

## 2015-12-28 MED ORDER — METOCLOPRAMIDE HCL 5 MG/ML IJ SOLN: 5.0000 mg | Freq: Three times a day (TID) | INTRAMUSCULAR | Status: DC | PRN

## 2015-12-28 MED ORDER — PHENOL 1.4 % MT LIQD
1.0000 | OROMUCOSAL | Status: DC | PRN
Start: 2015-12-28 — End: 2015-12-30

## 2015-12-28 MED ORDER — LIDOCAINE HCL (CARDIAC) 20 MG/ML IV SOLN
INTRAVENOUS | Status: DC | PRN
  Administered 2015-12-28: 100 mg via INTRAVENOUS

## 2015-12-28 MED ORDER — ONDANSETRON HCL 4 MG PO TABS: 4.0000 mg | ORAL_TABLET | Freq: Four times a day (QID) | ORAL | Status: DC | PRN

## 2015-12-28 MED ORDER — MORPHINE SULFATE (PF) 2 MG/ML IV SOLN: 0.5000 mg | INTRAVENOUS | Status: DC | PRN

## 2015-12-28 MED ORDER — FENTANYL CITRATE (PF) 100 MCG/2ML IJ SOLN
25.0000 ug | INTRAMUSCULAR | Status: DC | PRN
  Administered 2015-12-28: 25 ug via INTRAVENOUS
  Administered 2015-12-28: 50 ug via INTRAVENOUS
  Administered 2015-12-28: 25 ug via INTRAVENOUS

## 2015-12-28 MED ORDER — ARTIFICIAL TEARS OP OINT
TOPICAL_OINTMENT | OPHTHALMIC | Status: DC | PRN
  Administered 2015-12-28: 1 via OPHTHALMIC

## 2015-12-28 MED ORDER — METOCLOPRAMIDE HCL 5 MG PO TABS: 5.0000 mg | ORAL_TABLET | Freq: Three times a day (TID) | ORAL | Status: DC | PRN

## 2015-12-28 MED ORDER — FENTANYL CITRATE (PF) 100 MCG/2ML IJ SOLN
INTRAMUSCULAR | Status: DC | PRN
  Administered 2015-12-28: 50 ug via INTRAVENOUS

## 2015-12-28 MED ORDER — ACETAMINOPHEN 325 MG PO TABS: 650.0000 mg | ORAL_TABLET | Freq: Four times a day (QID) | ORAL | Status: DC | PRN

## 2015-12-28 MED ORDER — FAMOTIDINE IN NACL 20-0.9 MG/50ML-% IV SOLN
20.0000 mg | Freq: Two times a day (BID) | INTRAVENOUS | Status: AC
  Administered 2015-12-28: 20 mg via INTRAVENOUS
  Filled 2015-12-28 (×2): qty 50

## 2015-12-28 MED ORDER — ACETAMINOPHEN 500 MG PO TABS: 1000.0000 mg | ORAL_TABLET | Freq: Once | ORAL | Status: DC

## 2015-12-28 MED ORDER — MORPHINE SULFATE (PF) 4 MG/ML IV SOLN
4.0000 mg | Freq: Once | INTRAVENOUS | Status: AC
  Administered 2015-12-28: 4 mg via INTRAVENOUS
  Filled 2015-12-28: qty 1

## 2015-12-28 MED ORDER — OXYCODONE HCL 5 MG PO TABS
5.0000 mg | ORAL_TABLET | ORAL | Status: DC | PRN
  Administered 2015-12-28 – 2015-12-30 (×3): 10 mg via ORAL
  Filled 2015-12-28 (×3): qty 2

## 2015-12-28 MED ORDER — SUCCINYLCHOLINE CHLORIDE 200 MG/10ML IV SOSY
PREFILLED_SYRINGE | INTRAVENOUS | Status: AC
  Filled 2015-12-28: qty 10

## 2015-12-28 SURGICAL SUPPLY — 38 items
BNDG COHESIVE 4X5 TAN STRL (GAUZE/BANDAGES/DRESSINGS) ×3 IMPLANT
BNDG GAUZE ELAST 4 BULKY (GAUZE/BANDAGES/DRESSINGS) ×3 IMPLANT
CLOSURE WOUND 1/2 X4 (GAUZE/BANDAGES/DRESSINGS) ×1
COVER PERINEAL POST (MISCELLANEOUS) ×3 IMPLANT
COVER SURGICAL LIGHT HANDLE (MISCELLANEOUS) ×3 IMPLANT
DRAPE STERI IOBAN 125X83 (DRAPES) ×3 IMPLANT
DRSG MEPILEX BORDER 4X4 (GAUZE/BANDAGES/DRESSINGS) ×6 IMPLANT
DURAPREP 26ML APPLICATOR (WOUND CARE) ×3 IMPLANT
ELECT REM PT RETURN 9FT ADLT (ELECTROSURGICAL) ×3
ELECTRODE REM PT RTRN 9FT ADLT (ELECTROSURGICAL) ×1 IMPLANT
GLOVE BIO SURGEON STRL SZ7 (GLOVE) ×6 IMPLANT
GLOVE BIO SURGEON STRL SZ7.5 (GLOVE) ×5 IMPLANT
GLOVE BIOGEL PI IND STRL 7.0 (GLOVE) ×2 IMPLANT
GLOVE BIOGEL PI IND STRL 8 (GLOVE) ×1 IMPLANT
GLOVE BIOGEL PI INDICATOR 7.0 (GLOVE) ×4
GLOVE BIOGEL PI INDICATOR 8 (GLOVE) ×4
GOWN STRL REUS W/ TWL LRG LVL3 (GOWN DISPOSABLE) ×2 IMPLANT
GOWN STRL REUS W/TWL LRG LVL3 (GOWN DISPOSABLE) ×6
GUIDEROD T2 3X1000 (ROD) ×2 IMPLANT
K-WIRE  3.2X450M STR (WIRE) ×2
K-WIRE 3.2X450M STR (WIRE) ×1
KIT NAIL LONG 10X360MMX125 (Nail) ×2 IMPLANT
KIT ROOM TURNOVER OR (KITS) ×3 IMPLANT
KWIRE 3.2X450M STR (WIRE) IMPLANT
MANIFOLD NEPTUNE II (INSTRUMENTS) ×3 IMPLANT
NS IRRIG 1000ML POUR BTL (IV SOLUTION) ×3 IMPLANT
PACK GENERAL/GYN (CUSTOM PROCEDURE TRAY) ×3 IMPLANT
PAD ARMBOARD 7.5X6 YLW CONV (MISCELLANEOUS) ×3 IMPLANT
PAD CAST 4YDX4 CTTN HI CHSV (CAST SUPPLIES) ×1 IMPLANT
PADDING CAST COTTON 4X4 STRL (CAST SUPPLIES) ×3
SCREW LAG GAMMA 3 TI 10.5X90MM (Screw) ×2 IMPLANT
STRIP CLOSURE SKIN 1/2X4 (GAUZE/BANDAGES/DRESSINGS) ×1 IMPLANT
SUT MNCRL AB 4-0 PS2 18 (SUTURE) IMPLANT
SUT MON AB 2-0 CT1 27 (SUTURE) ×3 IMPLANT
SUT VIC AB 0 CT1 27 (SUTURE) ×3
SUT VIC AB 0 CT1 27XBRD ANBCTR (SUTURE) ×1 IMPLANT
TOWEL OR 17X24 6PK STRL BLUE (TOWEL DISPOSABLE) ×3 IMPLANT
TOWEL OR 17X26 10 PK STRL BLUE (TOWEL DISPOSABLE) ×3 IMPLANT

## 2015-12-28 NOTE — Clinical Social Work Note (Signed)
Clinical Social Work Assessment  Patient Details  Name: Christie Bruce MRN: [id number] Date of Birth: 1941/01/28  Date of referral:  12/28/15               Reason for consult:  Facility Placement                Permission sought to share information with:    Permission granted to share information::  Yes, Verbal Permission Granted  Name::      (Sons Lanny Hurst and Gerald Stabs)  Agency::   (SNFs in [place])  Relationship::     Contact Information:     Housing/Transportation Living arrangements for the past 2 months:  [place] of Information:  Patient, Adult Children (sons Gerald Stabs and [place]) Patient Interpreter Needed:  None Criminal Activity/Legal Involvement Pertinent to Current Situation/Hospitalization:  No - Comment as needed Significant Relationships:  Adult Children Lives with:    Do you feel safe going back to the place where you live?  Yes Need for family participation in patient care:  Yes (Comment) (Patient wishes for sons to be kept updated)  Care giving concerns:  Sons were present at time of assessment and no care giving concerns were noted.   Social Worker assessment / plan:  CSW assessed patient at bedside.  Patient is calm, cooperative and pleasant lying in bed awaiting surgery today at 12:30pm.  Sons, Gerald Stabs and Lanny Hurst were also present (patient's choice) at bedside during assessment.  Patient states she is familiar with [facility] and may wish to be discharged there at time of discharge.  CSW explained discharge/SNF referral process to patient and family.  Of note, patient will need [facility] transportation at time of discharge.  Patient is from home and lives with adult son, Gerald Stabs and beside of son, Lanny Hurst.  Patient does not have 24 hour care at time of discharge and is agreeable to SNF placement.  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  Sons report she cares for her like she's "going to work".  Patient completely independent prior to  admission and hopes to regain independence after STR completed.  Employment status:  Retired Nurse, adult PT Recommendations:  [place] / Referral to community resources:  [place]  Patient/Family's Response to care:  Sons and patient are agreeable to SNF.  Patient/Family's Understanding of and Emotional Response to Diagnosis, Current Treatment, and Prognosis:  Patient and family are realistic regarding prognosis and quality of life at time of discharge as well as after STR.  Patient expressed wishes to go home, but she understands that she will need assistance when she is discharged, but she doesn't like hospitals or needles.  Support was given along with encouragement for quick recovery and strength to work with therapy.  Emotional Assessment Appearance:  Appears older than stated age Attitude/Demeanor/Rapport:   (appropriate) Affect (typically observed):  Accepting, Adaptable Orientation:  Oriented to Self, Oriented to Place, Oriented to  Time, Oriented to Situation Alcohol / Substance use:  Not Applicable Psych involvement (Current and /or in the community):  No (Comment)  Discharge Needs  Concerns to be addressed:  No discharge needs identified Readmission within the last 30 days:  No Current discharge risk:  None Barriers to Discharge:  No Barriers Identified   Dulcy Fanny, LCSW 12/28/2015, 10:20 AM

## 2015-12-28 NOTE — Interval H&P Note (Signed)
History and Physical Interval Note:  12/28/2015 1:07 PM  Christie Bruce  has presented today for surgery, with the diagnosis of left hip fracture  The various methods of treatment have been discussed with the patient and family. After consideration of risks, benefits and other options for treatment, the patient has consented to  Procedure(s): INTRAMEDULLARY (IM) NAIL LEFT HIP (Left) as a surgical intervention .  The patient's history has been reviewed, patient examined, no change in status, stable for surgery.  I have reviewed the patient's chart and labs.  Questions were answered to the patient's satisfaction.     Sten Dematteo D

## 2015-12-28 NOTE — Consult Note (Signed)
ORTHOPAEDIC CONSULTATION  REQUESTING PHYSICIAN: Murlean Iba, MD  Chief Complaint: Left hip pain     Assessment: Principal Problem:   Femur fracture, left ([place]) Active Problems:   Anemia   CKD (chronic kidney disease), stage III  Plan: Plan for im nail today by Dr. Alain Marion. Weight Bearing Status: NWB - Plan for WBAT post op. PT VTE px: SCD's and additional per primary.  HPI: Christie Bruce is a 75 y.o. female who complains of left hip pain after a fall last night at home in the kitchen with feeling of room spinning.  She has a history of vertigo.  Pain was 7/10 and motion of limb is limited.  X-rays of the left hip revealed a impacted intertrochanteric left femur fracture.  Orthopedics was consulted for evaluation.  Past Medical History  Diagnosis Date  . Breast cancer ([place])   . Hypertension   . GERD (gastroesophageal reflux disease)   . Uterine prolapse   . 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 GERD (gastroesophageal reflux disease) 09/12/2011  . Heart murmur   . Chronic kidney disease    Past Surgical History  Procedure Laterality Date  . Bilateral mastectomy  07/19/10    bilat mastectomy for DCIS  . Knee surgery Left   . Wrist surgery Left    Social History   Social History  . Marital Status: Widowed    Spouse Name: N/A  . Number of Children: N/A  . Years of Education: N/A   Social History Main Topics  . Smoking status: Former Smoker -- 15 years    Types: Cigarettes  . Smokeless tobacco: Never Used  . Alcohol Use: No  . Drug Use: No  . Sexual Activity: No   Other Topics Concern  . None   Social History Narrative   Family History  Problem Relation Age of Onset  . Cancer Mother     melanoma  . Stroke Mother   . Cancer Father     prostate cancer  . Heart disease Father   . Hypertension Father   . Cancer Brother     lung cancer  . Cancer Paternal Uncle     lung cancer  . Hypertension Sister   . Heart attack  Brother   . Diabetes Brother   . Diabetes Brother    Allergies  Allergen Reactions  . Celebrex [Celecoxib]   . Sulfa Antibiotics     Had sores in my mouth, bottom of my feet "the skin just came right off"   Prior to Admission medications   Medication Sig Start Date End Date Taking? Authorizing Provider  albuterol (PROAIR HFA) 108 (90 BASE) MCG/ACT inhaler Inhale 2 puffs into the lungs every 6 (six) hours as needed for wheezing or shortness of breath. 07/28/13  Yes Mae Loree Fee, FNP  ALPRAZolam Duanne Moron) 0.25 MG tablet TAKE ONE TABLET TWICE DAILY AS NEEDED 12/27/15  Yes Mary-Margaret Hassell Done, FNP  cetirizine (ZYRTEC) 10 MG tablet Take 1 tablet (10 mg total) by mouth daily. 12/08/15  Yes Sharion Balloon, FNP  fluticasone (FLONASE) 50 MCG/ACT nasal spray Place 2 sprays into both nostrils daily. 12/08/15  Yes Sharion Balloon, FNP  lisinopril-hydrochlorothiazide (PRINZIDE,ZESTORETIC) 20-25 MG tablet TAKE ONE (1) TABLET EACH DAY 11/22/15  Yes Sharion Balloon, FNP  meloxicam (MOBIC) 7.5 MG tablet TAKE ONE (1) TABLET EACH DAY 11/22/15  Yes Sharion Balloon, FNP  omeprazole (PRILOSEC) 40 MG capsule Take 1 capsule (40 mg  total) by mouth daily. Patient taking differently: Take 40 mg by mouth as needed.  05/13/15  Yes Mary-Margaret Martin, FNP   Dg Ribs Unilateral W/chest Left  12/27/2015  CLINICAL DATA:  Recent fall with left-sided chest pain, initial encounter EXAM: LEFT RIBS AND CHEST - 3+ VIEW COMPARISON:  05/13/2015 FINDINGS: Cardiac shadow is stable. Mild interstitial changes are noted throughout both lungs stable from the previous exam. No focal infiltrate is seen. No sizable pneumothorax is noted. No definitive rib fracture is identified. IMPRESSION: No rib fracture is identified. Electronically Signed   By: Mark  Lukens M.D.   On: 12/27/2015 23:42   Dg Lumbar Spine Complete  12/27/2015  CLINICAL DATA:  Fall with left lower lumbar spine pain. Initial encounter. EXAM: LUMBAR SPINE - COMPLETE 4+ VIEW  COMPARISON:  Chest x-ray 05/13/2015 FINDINGS: Compression fracture of T12, L1 inferior endplate, L2 superior endplate, and L4 superior endplate. T12, L1, and L2 fractures are chronic based on comparison chest x-ray. No comparison for the L4 fracture, but chronic appearing. Overall no definitive fracture. No traumatic malalignment. Osteopenia. Usual degenerative changes for age. Cholelithiasis. IMPRESSION: 1. L4 superior endplate fracture, favored chronic. 2. T12, L1, and L2 compression fractures, chronic based on previous imaging. 3. Cholelithiasis. Electronically Signed   By: Jonathon  Watts M.D.   On: 12/27/2015 23:46   Ct Head Wo Contrast  12/28/2015  CLINICAL DATA:  Dizziness. Fall with left hip fracture. Initial encounter. EXAM: CT HEAD WITHOUT CONTRAST TECHNIQUE: Contiguous axial images were obtained from the base of the skull through the vertex without intravenous contrast. COMPARISON:  None. FINDINGS: Skull and Sinuses:Negative for fracture or hemo sinus. Visualized orbits: Bilateral cataract resection. No posttraumatic finding. Brain: No evidence of acute infarction, hemorrhage, hydrocephalus, or mass lesion/mass effect. Mild for age cerebral volume loss and chronic microvascular disease in the cerebral white matter. IMPRESSION: 1. No evidence of intracranial injury. 2. Age congruent senescent changes. Electronically Signed   By: Jonathon  Watts M.D.   On: 12/28/2015 00:05   Dg Hip Unilat With Pelvis 2-3 Views Left  12/27/2015  CLINICAL DATA:  Fall.  Rotated left hip.  Initial encounter. EXAM: DG HIP (WITH OR WITHOUT PELVIS) 2-3V LEFT COMPARISON:  None. FINDINGS: Acute intertrochanteric left femur fracture with medial impaction. No evidence of pelvic ring fracture or diastasis. Osteopenia.  No notable degenerative changes. IMPRESSION: Impacted intertrochanteric left femur fracture. Electronically Signed   By: Jonathon  Watts M.D.   On: 12/27/2015 23:40    Positive ROS: All other systems have been  reviewed and were otherwise negative with the exception of those mentioned in the HPI and as above.  Objective: Labs cbc  Recent Labs  12/28/15 0008  WBC 9.1  HGB 9.7*  HCT 31.9*  PLT 158    Labs inflam No results for input(s): CRP in the last 72 hours.  Invalid input(s): ESR  Labs coag  Recent Labs  12/28/15 0351  INR 1.10     Recent Labs  12/28/15 0008  NA 140  K 4.5  CL 110  CO2 23  GLUCOSE 185*  BUN 19  CREATININE 1.25*  CALCIUM 8.8*    Physical Exam: Filed Vitals:   12/28/15 0310 12/28/15 0531  BP: 146/66 112/52  Pulse: 83 71  Temp: 98 F (36.7 C) 97.8 F (36.6 C)  Resp: 16 16   General: Alert, no acute distress Cardiovascular: No pedal edema Respiratory: No cyanosis, no use of accessory musculature GI: No organomegaly, abdomen is soft and non-tender Skin: No   lesions in the area of chief complaint other than those listed below in MSK exam.  Neurologic: Sensation intact distally  Psychiatric: Patient is competent for consent with normal mood and affect  MUSCULOSKELETAL:  Left leg externally rotated.  Painful with movement.  Distal sensation intact, NVI.  Foot warm. Other extremities are atraumatic with painless ROM and NVI.   Henry Calvin Martensen III PA-C 12/28/2015 8:22 AM  

## 2015-12-28 NOTE — Progress Notes (Signed)
PROGRESS NOTE    MEHREEN GUAMAN  [id number]  DOB: 07/01/1941  DOA: 12/27/2015 PCP: Chevis Pretty, FNP Outpatient Specialists:   Hospital course: Christie Bruce is a 75 y.o. female who complains of left hip pain after a fall last night at home in the kitchen with feeling of room spinning. She has a history of vertigo. Pain was 7/10 and motion of limb is limited. X-rays of the left hip revealed a impacted intertrochanteric left femur fracture. Orthopedics was consulted for evaluation.   Assessment & Plan: Left hip fracture secondary to fall: Acute. Patient reports having symptoms of vertigo prior to falling at home. X-rays revealed a left impacted intratrochanteric fracture - Admit to MedSurg bed - NPO - Hip fracture protocol initiated - Checked PT/APTT and EKG prior to surgery - Incentive spirometry - IV fluids normal saline at 75 ml/hr  - Morphine when necessary pain - Ortho consulted and plan to OR today for IM nail by Dr. Percell Miller  Anemia: Hemoglobin 9.7 on admission previously noted to be 10.9 back in 2014 - Type and screen for possible need of blood products - Continue to monitor  Chronic kidney disease stage III: Chronic. Baseline creatinine had been previously between 1.01 and 1.31 - Continue to monitor  Essential hypertension - Continue to monitor  Anxiety - Ativan IV prn  GERD - Pepcid IV 1 dose and then restart home medications when taking PO  DVT prophylaxis: SCDs Code Status: Full Family Communication: no one at bedside Disposition Plan: Likely will need SNF rehab   Consultants:  [facility]  Procedures: Plan for im nail today by Dr. Alain Marion 12/28/15   Subjective: Pt reports that she has pain in left hip/leg  Objective: Filed Vitals:   12/28/15 0215 12/28/15 0230 12/28/15 0310 12/28/15 0531  BP: 139/92 109/83 146/66 112/52  Pulse: 89 83 83 71  Temp:   98 F (36.7 C) 97.8 F (36.6 C)  TempSrc:   Oral Oral  Resp:    16 16  SpO2: 94% 94% 98% 98%    Intake/Output Summary (Last 24 hours) at 12/28/15 0856 Last data filed at 12/28/15 0600  Gross per 24 hour  Intake  177.5 ml  Output      0 ml  Net  177.5 ml   There were no vitals filed for this visit.  Exam:  General exam: awake, alert, no distress, cooperative Respiratory system: Clear. No increased work of breathing. Cardiovascular system: S1 & S2 heard, RRR. No JVD, murmurs, gallops, clicks or pedal edema. Gastrointestinal system: Abdomen is nondistended, soft and nontender. Normal bowel sounds heard. Central nervous system: Alert and oriented. No focal neurological deficits. Extremities: Left leg externally rotated. Painful with movement. Distal sensation intact, NVI. Foot warm. Other extremities are atraumatic with painless ROM and NVI.  Data Reviewed: Basic Metabolic Panel:  Recent Labs Lab 12/28/15 0008  NA 140  K 4.5  CL 110  CO2 23  GLUCOSE 185*  BUN 19  CREATININE 1.25*  CALCIUM 8.8*   Liver Function Tests: No results for input(s): AST, ALT, ALKPHOS, BILITOT, PROT, ALBUMIN in the last 168 hours. No results for input(s): LIPASE, AMYLASE in the last 168 hours. No results for input(s): AMMONIA in the last 168 hours. CBC:  Recent Labs Lab 12/28/15 0008  WBC 9.1  NEUTROABS 7.6  HGB 9.7*  HCT 31.9*  MCV 92.2  PLT 158   Cardiac Enzymes: No results for input(s): CKTOTAL, CKMB, CKMBINDEX, TROPONINI in the last 168 hours.  BNP (last 3 results) No results for input(s): PROBNP in the last 8760 hours. CBG: No results for input(s): GLUCAP in the last 168 hours.  Recent Results (from the past 240 hour(s))  Surgical pcr screen     Status: None   Collection Time: 12/28/15  3:55 AM  Result Value Ref Range Status   MRSA, PCR NEGATIVE NEGATIVE Final   Staphylococcus aureus NEGATIVE NEGATIVE Final    Comment:        The Xpert SA Assay (FDA approved for NASAL specimens in patients over 44 years of age), is one component  of a comprehensive surveillance program.  Test performance has been validated by [facility] for patients greater than or equal to 12 year old. It is not intended to diagnose infection nor to guide or monitor treatment.      Studies: Dg Ribs Unilateral W/chest Left  12/27/2015  CLINICAL DATA:  Recent fall with left-sided chest pain, initial encounter EXAM: LEFT RIBS AND CHEST - 3+ VIEW COMPARISON:  05/13/2015 FINDINGS: Cardiac shadow is stable. Mild interstitial changes are noted throughout both lungs stable from the previous exam. No focal infiltrate is seen. No sizable pneumothorax is noted. No definitive rib fracture is identified. IMPRESSION: No rib fracture is identified. Electronically Signed   By: Inez Catalina M.D.   On: 12/27/2015 23:42   Dg Lumbar Spine Complete  12/27/2015  CLINICAL DATA:  Fall with left lower lumbar spine pain. Initial encounter. EXAM: LUMBAR SPINE - COMPLETE 4+ VIEW COMPARISON:  Chest x-ray 05/13/2015 FINDINGS: Compression fracture of T12, L1 inferior endplate, L2 superior endplate, and L4 superior endplate. T12, L1, and L2 fractures are chronic based on comparison chest x-ray. No comparison for the L4 fracture, but chronic appearing. Overall no definitive fracture. No traumatic malalignment. Osteopenia. Usual degenerative changes for age. Cholelithiasis. IMPRESSION: 1. L4 superior endplate fracture, favored chronic. 2. T12, L1, and L2 compression fractures, chronic based on previous imaging. 3. Cholelithiasis. Electronically Signed   By: Monte Fantasia M.D.   On: 12/27/2015 23:46   Ct Head Wo Contrast  12/28/2015  CLINICAL DATA:  Dizziness. Fall with left hip fracture. Initial encounter. EXAM: CT HEAD WITHOUT CONTRAST TECHNIQUE: Contiguous axial images were obtained from the base of the skull through the vertex without intravenous contrast. COMPARISON:  None. FINDINGS: Skull and Sinuses:Negative for fracture or hemo sinus. Visualized orbits: Bilateral cataract  resection. No posttraumatic finding. Brain: No evidence of acute infarction, hemorrhage, hydrocephalus, or mass lesion/mass effect. Mild for age cerebral volume loss and chronic microvascular disease in the cerebral white matter. IMPRESSION: 1. No evidence of intracranial injury. 2. Age congruent senescent changes. Electronically Signed   By: Monte Fantasia M.D.   On: 12/28/2015 00:05   Dg Hip Unilat With Pelvis 2-3 Views Left  12/27/2015  CLINICAL DATA:  Fall.  Rotated left hip.  Initial encounter. EXAM: DG HIP (WITH OR WITHOUT PELVIS) 2-3V LEFT COMPARISON:  None. FINDINGS: Acute intertrochanteric left femur fracture with medial impaction. No evidence of pelvic ring fracture or diastasis. Osteopenia.  No notable degenerative changes. IMPRESSION: Impacted intertrochanteric left femur fracture. Electronically Signed   By: Monte Fantasia M.D.   On: 12/27/2015 23:40    Scheduled Meds:  Continuous Infusions: . sodium chloride 75 mL/hr at 12/28/15 [id number]    Principal Problem:   Femur fracture, left ([facility]) Active Problems:   Anemia   CKD (chronic kidney disease), stage III   Time spent: 20 mins   Irwin Brakeman, MD, FAAFP [facility] Pager [phone]  [phone]  If 7PM-7AM, please contact night-coverage [email or web address] Password [email or web address] 12/28/2015, 8:56 AM    LOS: 0 days

## 2015-12-28 NOTE — Anesthesia Preprocedure Evaluation (Addendum)
Anesthesia Evaluation  Patient identified by MRN, date of birth, ID band Patient awake    Reviewed: Allergy & Precautions, NPO status , Patient's Chart, lab work & pertinent test results  History of Anesthesia Complications (+) PONV and history of anesthetic complications  Airway Mallampati: II  TM Distance: >3 FB Neck ROM: Full    Dental  (+) Teeth Intact, Dental Advisory Given   Pulmonary asthma , former smoker,    Pulmonary exam normal breath sounds clear to auscultation       Cardiovascular hypertension, Pt. on medications Normal cardiovascular exam Rhythm:Regular Rate:Normal     Neuro/Psych PSYCHIATRIC DISORDERS Anxiety negative neurological ROS     GI/Hepatic Neg liver ROS, GERD  Medicated,  Endo/Other  negative endocrine ROS  Renal/GU Renal InsufficiencyRenal disease     Musculoskeletal negative musculoskeletal ROS (+)   Abdominal   Peds  Hematology  (+) Blood dyscrasia, anemia ,   Anesthesia Other Findings Day of surgery medications reviewed with the patient.  DCIS s/p b/l mastectomy  Reproductive/Obstetrics                           Anesthesia Physical Anesthesia Plan  ASA: II  Anesthesia Plan: General   Post-op Pain Management:    Induction: Intravenous  Airway Management Planned: Oral ETT  Additional Equipment:   Intra-op Plan:   Post-operative Plan: Extubation in OR  Informed Consent: I have reviewed the patients History and Physical, chart, labs and discussed the procedure including the risks, benefits and alternatives for the proposed anesthesia with the patient or authorized representative who has indicated his/her understanding and acceptance.   Dental advisory given  Plan Discussed with: CRNA  Anesthesia Plan Comments: (Risks/benefits of general anesthesia discussed with patient including risk of damage to teeth, lips, gum, and tongue, nausea/vomiting,  allergic reactions to medications, and the possibility of heart attack, stroke and death.  All patient questions answered.  Patient wishes to proceed.)        Anesthesia Quick Evaluation

## 2015-12-28 NOTE — H&P (View-Only) (Signed)
   ORTHOPAEDIC CONSULTATION  REQUESTING PHYSICIAN: Clanford L Johnson, MD  Chief Complaint: Left hip pain     Assessment: Principal Problem:   Femur fracture, left ([facility]) Active Problems:   Anemia   CKD (chronic kidney disease), stage III  Plan: Plan for im nail today by Dr. T. [name]. Weight Bearing Status: NWB - Plan for WBAT post op. PT VTE px: SCD's and additional per primary.  HPI: Christie Bruce is a 75 y.o. female who complains of left hip pain after a fall last night at home in the kitchen with feeling of room spinning.  She has a history of vertigo.  Pain was 7/10 and motion of limb is limited.  X-rays of the left hip revealed a impacted intertrochanteric left femur fracture.  Orthopedics was consulted for evaluation.  Past Medical History  Diagnosis Date  . Breast cancer ([facility])   . Hypertension   . GERD (gastroesophageal reflux disease)   . Uterine prolapse   . 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 GERD (gastroesophageal reflux disease) 09/12/2011  . Heart murmur   . Chronic kidney disease    Past Surgical History  Procedure Laterality Date  . Bilateral mastectomy  07/19/10    bilat mastectomy for DCIS  . Knee surgery Left   . Wrist surgery Left    Social History   Social History  . Marital Status: Widowed    Spouse Name: N/A  . Number of Children: N/A  . Years of Education: N/A   Social History Main Topics  . Smoking status: Former Smoker -- 15 years    Types: Cigarettes  . Smokeless tobacco: Never Used  . Alcohol Use: No  . Drug Use: No  . Sexual Activity: No   Other Topics Concern  . None   Social History Narrative   Family History  Problem Relation Age of Onset  . Cancer Mother     melanoma  . Stroke Mother   . Cancer Father     prostate cancer  . Heart disease Father   . Hypertension Father   . Cancer Brother     lung cancer  . Cancer Paternal Uncle     lung cancer  . Hypertension Sister   . Heart attack  Brother   . Diabetes Brother   . Diabetes Brother    Allergies  Allergen Reactions  . Celebrex [Celecoxib]   . Sulfa Antibiotics     Had sores in my mouth, bottom of my feet "the skin just came right off"   Prior to Admission medications   Medication Sig Start Date End Date Taking? Authorizing Provider  albuterol (PROAIR HFA) 108 (90 BASE) MCG/ACT inhaler Inhale 2 puffs into the lungs every 6 (six) hours as needed for wheezing or shortness of breath. 07/28/13  Yes Mae E Haliburton, FNP  ALPRAZolam (XANAX) 0.25 MG tablet TAKE ONE TABLET TWICE DAILY AS NEEDED 12/27/15  Yes Mary-Margaret Martin, FNP  cetirizine (ZYRTEC) 10 MG tablet Take 1 tablet (10 mg total) by mouth daily. 12/08/15  Yes Christy A Hawks, FNP  fluticasone (FLONASE) 50 MCG/ACT nasal spray Place 2 sprays into both nostrils daily. 12/08/15  Yes Christy A Hawks, FNP  lisinopril-hydrochlorothiazide (PRINZIDE,ZESTORETIC) 20-25 MG tablet TAKE ONE (1) TABLET EACH DAY 11/22/15  Yes Christy A Hawks, FNP  meloxicam (MOBIC) 7.5 MG tablet TAKE ONE (1) TABLET EACH DAY 11/22/15  Yes Christy A Hawks, FNP  omeprazole (PRILOSEC) 40 MG capsule Take 1 capsule (40 mg   total) by mouth daily. Patient taking differently: Take 40 mg by mouth as needed.  05/13/15  Yes Mary-Margaret Martin, FNP   Dg Ribs Unilateral W/chest Left  12/27/2015  CLINICAL DATA:  Recent fall with left-sided chest pain, initial encounter EXAM: LEFT RIBS AND CHEST - 3+ VIEW COMPARISON:  05/13/2015 FINDINGS: Cardiac shadow is stable. Mild interstitial changes are noted throughout both lungs stable from the previous exam. No focal infiltrate is seen. No sizable pneumothorax is noted. No definitive rib fracture is identified. IMPRESSION: No rib fracture is identified. Electronically Signed   By: Mark  Lukens M.D.   On: 12/27/2015 23:42   Dg Lumbar Spine Complete  12/27/2015  CLINICAL DATA:  Fall with left lower lumbar spine pain. Initial encounter. EXAM: LUMBAR SPINE - COMPLETE 4+ VIEW  COMPARISON:  Chest x-ray 05/13/2015 FINDINGS: Compression fracture of T12, L1 inferior endplate, L2 superior endplate, and L4 superior endplate. T12, L1, and L2 fractures are chronic based on comparison chest x-ray. No comparison for the L4 fracture, but chronic appearing. Overall no definitive fracture. No traumatic malalignment. Osteopenia. Usual degenerative changes for age. Cholelithiasis. IMPRESSION: 1. L4 superior endplate fracture, favored chronic. 2. T12, L1, and L2 compression fractures, chronic based on previous imaging. 3. Cholelithiasis. Electronically Signed   By: Jonathon  Watts M.D.   On: 12/27/2015 23:46   Ct Head Wo Contrast  12/28/2015  CLINICAL DATA:  Dizziness. Fall with left hip fracture. Initial encounter. EXAM: CT HEAD WITHOUT CONTRAST TECHNIQUE: Contiguous axial images were obtained from the base of the skull through the vertex without intravenous contrast. COMPARISON:  None. FINDINGS: Skull and Sinuses:Negative for fracture or hemo sinus. Visualized orbits: Bilateral cataract resection. No posttraumatic finding. Brain: No evidence of acute infarction, hemorrhage, hydrocephalus, or mass lesion/mass effect. Mild for age cerebral volume loss and chronic microvascular disease in the cerebral white matter. IMPRESSION: 1. No evidence of intracranial injury. 2. Age congruent senescent changes. Electronically Signed   By: Jonathon  Watts M.D.   On: 12/28/2015 00:05   Dg Hip Unilat With Pelvis 2-3 Views Left  12/27/2015  CLINICAL DATA:  Fall.  Rotated left hip.  Initial encounter. EXAM: DG HIP (WITH OR WITHOUT PELVIS) 2-3V LEFT COMPARISON:  None. FINDINGS: Acute intertrochanteric left femur fracture with medial impaction. No evidence of pelvic ring fracture or diastasis. Osteopenia.  No notable degenerative changes. IMPRESSION: Impacted intertrochanteric left femur fracture. Electronically Signed   By: Jonathon  Watts M.D.   On: 12/27/2015 23:40    Positive ROS: All other systems have been  reviewed and were otherwise negative with the exception of those mentioned in the HPI and as above.  Objective: Labs cbc  Recent Labs  12/28/15 0008  WBC 9.1  HGB 9.7*  HCT 31.9*  PLT 158    Labs inflam No results for input(s): CRP in the last 72 hours.  Invalid input(s): ESR  Labs coag  Recent Labs  12/28/15 0351  INR 1.10     Recent Labs  12/28/15 0008  NA 140  K 4.5  CL 110  CO2 23  GLUCOSE 185*  BUN 19  CREATININE 1.25*  CALCIUM 8.8*    Physical Exam: Filed Vitals:   12/28/15 0310 12/28/15 0531  BP: 146/66 112/52  Pulse: 83 71  Temp: 98 F (36.7 C) 97.8 F (36.6 C)  Resp: 16 16   General: Alert, no acute distress Cardiovascular: No pedal edema Respiratory: No cyanosis, no use of accessory musculature GI: No organomegaly, abdomen is soft and non-tender Skin: No   lesions in the area of chief complaint other than those listed below in MSK exam.  Neurologic: Sensation intact distally  Psychiatric: Patient is competent for consent with normal mood and affect  MUSCULOSKELETAL:  Left leg externally rotated.  Painful with movement.  Distal sensation intact, NVI.  Foot warm. Other extremities are atraumatic with painless ROM and NVI.   Jaina Morin Calvin Martensen III PA-C 12/28/2015 8:22 AM  

## 2015-12-28 NOTE — Consult Note (Signed)
I have reviewed her films and tentative plan is for OR at 12 today for im nail.  Formal cs to follow    Tonea Leiphart D

## 2015-12-28 NOTE — Anesthesia Procedure Notes (Signed)
Procedure Name: Intubation Date/Time: 12/28/2015 1:34 PM Performed by: Scheryl Darter Pre-anesthesia Checklist: Patient identified, Emergency Drugs available, Suction available and Patient being monitored Patient Re-evaluated:Patient Re-evaluated prior to inductionOxygen Delivery Method: Circle System Utilized Preoxygenation: Pre-oxygenation with 100% oxygen Intubation Type: IV induction Ventilation: Mask ventilation without difficulty Laryngoscope Size: Mac and 3 Grade View: Grade I Tube type: Oral Number of attempts: 1 Airway Equipment and Method: Stylet and Oral airway Placement Confirmation: ETT inserted through vocal cords under direct vision,  positive ETCO2 and breath sounds checked- equal and bilateral Tube secured with: Tape Dental Injury: Teeth and Oropharynx as per pre-operative assessment

## 2015-12-28 NOTE — ED Notes (Signed)
Someone from [facility] tried to call report on incorrect phone. When RN spoke, no one replied. RN tried to call unit and number that called and no one answered at either number.

## 2015-12-28 NOTE — Progress Notes (Signed)
Pt transferred to OR via hospital bed per MD order without incident. Pt accompanied by family. No change from AM assessment. Rept called to [facility] in preop holding area prior to pt leaving the floor.

## 2015-12-28 NOTE — Progress Notes (Signed)
Orthopedic Tech Progress Note Patient Details:  Christie Bruce 05/31/1941 [id number] Pt. is unable to use OHF with trapeze.     [name] 12/28/2015, 6:26 PM

## 2015-12-28 NOTE — Transfer of Care (Signed)
Immediate Anesthesia Transfer of Care Note  Patient: [name]  Procedure(s) Performed: Procedure(s): INTRAMEDULLARY (IM) NAIL LEFT HIP (Left)  Patient Location: PACU  Anesthesia Type:General  Level of Consciousness: awake, alert , oriented and sedated  Airway & Oxygen Therapy: Patient Spontanous Breathing and Patient connected to face mask oxygen  Post-op Assessment: Report given to RN, Post -op Vital signs reviewed and stable and Patient moving all extremities  Post vital signs: Reviewed and stable  Last Vitals:  Filed Vitals:   12/28/15 0531 12/28/15 1115  BP: 112/52 146/61  Pulse: 71 77  Temp: 36.6 C 37.2 C  Resp: 16 16    Last Pain:  Filed Vitals:   12/28/15 1450  PainSc: 4          Complications: No apparent anesthesia complications

## 2015-12-28 NOTE — H&P (Addendum)
History and Physical    Christie Bruce [id number] DOB: Sep 05, 1940 DOA: 12/27/2015  Referring MD/NP/PA: Vernell Barrier PA-C PCP: Chevis Pretty, FNP  Patient coming from: Home  Chief Complaint: Fall with Left hip pain  HPI: Christie Bruce is a 75 y.o. female with medical history significant of a HTN, CKD stage III, DCIS s/p b/l mastectomy, and vertigo; who presents after having a fall at home last night around 8 PM. Patient notes going into the kitchen and acutely feeling as though the room did a half spin around her. She tried reaching out to the wall to brace herself, but was unable to do so and fell into the cabinets and onto the floor with her left side. Patient does not recall sustaining any trauma to her head or loss of consciousness. After she was unable to move due to significant pain in the left hip. Rates  pain a 7 out of 10 on a pain scale. Her son who was home at the time called EMS. Denies having any chest pain, shortness of breath, fever, chills, or headache. She had recently been dealing congestion and sinus trouble. Had been seen by her primary care provider 5/18 which point she was given Augmentin and Zyrtec. She still reports having sinus drainage and intermittent cough.   ED Course: Upon admission into the emergency department patient was seen to be afebrile with vitals otherwise within normal limits. Lab work revealed hemoglobin of 9.7, BUN 19, creatinine of 1.25, calcium 8.8, glucose 185. X-rays of the left hip revealed a impacted intertrochanteric left femur fracture. Patient was given 4 mg of morphine in the ED. ED physician consulted Dr. Edmonia Lynch who recommended TRH to admit and that patient should remain NPO for likely surgery in a.m.  Review of Systems: As per HPI otherwise 10 point review of systems negative.   Past Medical History  Diagnosis Date  . Breast cancer ([place])   . Hypertension   . GERD (gastroesophageal reflux disease)   . Uterine prolapse   . DCIS  (ductal carcinoma in situ) of breast 09/12/2011  . GERD (gastroesophageal reflux disease) 09/12/2011  . Heart murmur   . Chronic kidney disease     Past Surgical History  Procedure Laterality Date  . Bilateral mastectomy  07/19/10    bilat mastectomy for DCIS  . Knee surgery Left   . Wrist surgery Left      reports that she has quit smoking. Her smoking use included Cigarettes. She quit after 15 years of use. She has never used smokeless tobacco. She reports that she does not drink alcohol or use illicit drugs.  Allergies  Allergen Reactions  . Celebrex [Celecoxib]   . Sulfa Antibiotics     Had sores in my mouth, bottom of my feet "the skin just came right off"    Family History  Problem Relation Age of Onset  . Cancer Mother     melanoma  . Stroke Mother   . Cancer Father     prostate cancer  . Heart disease Father   . Hypertension Father   . Cancer Brother     lung cancer  . Cancer Paternal Uncle     lung cancer  . Hypertension Sister   . Heart attack Brother   . Diabetes Brother   . Diabetes Brother     Prior to Admission medications   Medication Sig Start Date End Date Taking? Authorizing Provider  albuterol (PROAIR HFA) 108 (90 BASE) MCG/ACT inhaler Inhale 2  puffs into the lungs every 6 (six) hours as needed for wheezing or shortness of breath. 07/28/13  Yes Mae Loree Fee, FNP  ALPRAZolam Duanne Moron) 0.25 MG tablet TAKE ONE TABLET TWICE DAILY AS NEEDED 12/27/15  Yes Mary-Margaret Hassell Done, FNP  cetirizine (ZYRTEC) 10 MG tablet Take 1 tablet (10 mg total) by mouth daily. 12/08/15  Yes Sharion Balloon, FNP  fluticasone (FLONASE) 50 MCG/ACT nasal spray Place 2 sprays into both nostrils daily. 12/08/15  Yes Sharion Balloon, FNP  lisinopril-hydrochlorothiazide (PRINZIDE,ZESTORETIC) 20-25 MG tablet TAKE ONE (1) TABLET EACH DAY 11/22/15  Yes Sharion Balloon, FNP  meloxicam (MOBIC) 7.5 MG tablet TAKE ONE (1) TABLET EACH DAY 11/22/15  Yes Sharion Balloon, FNP  omeprazole (PRILOSEC) 40  MG capsule Take 1 capsule (40 mg total) by mouth daily. Patient taking differently: Take 40 mg by mouth as needed.  05/13/15  Yes Mary-Margaret Hassell Done, FNP    Physical Exam: Filed Vitals:   12/28/15 0100 12/28/15 0115 12/28/15 0130 12/28/15 0145  BP: 137/63 138/71 131/61 137/57  Pulse: 84 81 84 83  Temp:      TempSrc:      Resp:      SpO2: 96% 96% 96% 95%      Constitutional: Elderly female in mild distress, alert and able to follow commands Filed Vitals:   12/28/15 0100 12/28/15 0115 12/28/15 0130 12/28/15 0145  BP: 137/63 138/71 131/61 137/57  Pulse: 84 81 84 83  Temp:      TempSrc:      Resp:      SpO2: 96% 96% 96% 95%   Eyes: PERRL, lids and conjunctivae normal ENMT: Mucous membranes are Dry. Posterior pharynx clear of any exudate or lesions.Normal dentition.  Neck: normal, supple, no masses, no thyromegaly Respiratory: clear to auscultation bilaterally, no wheezing, no crackles. Normal respiratory effort. No accessory muscle use.  Cardiovascular: Regular rate and rhythm, Positive systolic murmur / rubs / gallops. No extremity edema. 2+ pedal pulses. No carotid bruits.  Abdomen: no tenderness, no masses palpated. No hepatosplenomegaly. Bowel sounds positive.  Musculoskeletal: no clubbing / cyanosis. Left leg externally rotated painful with any manipulation.  Skin: no rashes, lesions, ulcers. No induration Neurologic: CN 2-12 grossly intact. Sensation intact, DTR normal. Strength 5/5 in all 4.  Psychiatric: Normal judgment and insight. Alert and oriented x 3. Normal mood.     Labs on Admission: I have personally reviewed following labs and imaging studies  CBC:  Recent Labs Lab 12/28/15 0008  WBC 9.1  NEUTROABS 7.6  HGB 9.7*  HCT 31.9*  MCV 92.2  PLT 0000000   Basic Metabolic Panel:  Recent Labs Lab 12/28/15 0008  NA 140  K 4.5  CL 110  CO2 23  GLUCOSE 185*  BUN 19  CREATININE 1.25*  CALCIUM 8.8*   GFR: CrCl cannot be calculated (Unknown ideal  weight.). Liver Function Tests: No results for input(s): AST, ALT, ALKPHOS, BILITOT, PROT, ALBUMIN in the last 168 hours. No results for input(s): LIPASE, AMYLASE in the last 168 hours. No results for input(s): AMMONIA in the last 168 hours. Coagulation Profile: No results for input(s): INR, PROTIME in the last 168 hours. Cardiac Enzymes: No results for input(s): CKTOTAL, CKMB, CKMBINDEX, TROPONINI in the last 168 hours. BNP (last 3 results) No results for input(s): PROBNP in the last 8760 hours. HbA1C: No results for input(s): HGBA1C in the last 72 hours. CBG: No results for input(s): GLUCAP in the last 168 hours. Lipid Profile: No results for input(s):  CHOL, HDL, LDLCALC, TRIG, CHOLHDL, LDLDIRECT in the last 72 hours. Thyroid Function Tests: No results for input(s): TSH, T4TOTAL, FREET4, T3FREE, THYROIDAB in the last 72 hours. Anemia Panel: No results for input(s): VITAMINB12, FOLATE, FERRITIN, TIBC, IRON, RETICCTPCT in the last 72 hours. Urine analysis: No results found for: COLORURINE, APPEARANCEUR, LABSPEC, PHURINE, GLUCOSEU, HGBUR, BILIRUBINUR, KETONESUR, PROTEINUR, UROBILINOGEN, NITRITE, LEUKOCYTESUR Sepsis Labs: No results found for this or any previous visit (from the past 240 hour(s)).   Radiological Exams on Admission: Dg Ribs Unilateral W/chest Left  12/27/2015  CLINICAL DATA:  Recent fall with left-sided chest pain, initial encounter EXAM: LEFT RIBS AND CHEST - 3+ VIEW COMPARISON:  05/13/2015 FINDINGS: Cardiac shadow is stable. Mild interstitial changes are noted throughout both lungs stable from the previous exam. No focal infiltrate is seen. No sizable pneumothorax is noted. No definitive rib fracture is identified. IMPRESSION: No rib fracture is identified. Electronically Signed   By: Inez Catalina M.D.   On: 12/27/2015 23:42   Dg Lumbar Spine Complete  12/27/2015  CLINICAL DATA:  Fall with left lower lumbar spine pain. Initial encounter. EXAM: LUMBAR SPINE - COMPLETE 4+  VIEW COMPARISON:  Chest x-ray 05/13/2015 FINDINGS: Compression fracture of T12, L1 inferior endplate, L2 superior endplate, and L4 superior endplate. T12, L1, and L2 fractures are chronic based on comparison chest x-ray. No comparison for the L4 fracture, but chronic appearing. Overall no definitive fracture. No traumatic malalignment. Osteopenia. Usual degenerative changes for age. Cholelithiasis. IMPRESSION: 1. L4 superior endplate fracture, favored chronic. 2. T12, L1, and L2 compression fractures, chronic based on previous imaging. 3. Cholelithiasis. Electronically Signed   By: Monte Fantasia M.D.   On: 12/27/2015 23:46   Ct Head Wo Contrast  12/28/2015  CLINICAL DATA:  Dizziness. Fall with left hip fracture. Initial encounter. EXAM: CT HEAD WITHOUT CONTRAST TECHNIQUE: Contiguous axial images were obtained from the base of the skull through the vertex without intravenous contrast. COMPARISON:  None. FINDINGS: Skull and Sinuses:Negative for fracture or hemo sinus. Visualized orbits: Bilateral cataract resection. No posttraumatic finding. Brain: No evidence of acute infarction, hemorrhage, hydrocephalus, or mass lesion/mass effect. Mild for age cerebral volume loss and chronic microvascular disease in the cerebral white matter. IMPRESSION: 1. No evidence of intracranial injury. 2. Age congruent senescent changes. Electronically Signed   By: Monte Fantasia M.D.   On: 12/28/2015 00:05   Dg Hip Unilat With Pelvis 2-3 Views Left  12/27/2015  CLINICAL DATA:  Fall.  Rotated left hip.  Initial encounter. EXAM: DG HIP (WITH OR WITHOUT PELVIS) 2-3V LEFT COMPARISON:  None. FINDINGS: Acute intertrochanteric left femur fracture with medial impaction. No evidence of pelvic ring fracture or diastasis. Osteopenia.  No notable degenerative changes. IMPRESSION: Impacted intertrochanteric left femur fracture. Electronically Signed   By: Monte Fantasia M.D.   On: 12/27/2015 23:40      Assessment/Plan Left hip fracture  secondary to fall: Acute. Patient reports having symptoms of vertigo prior to falling at home. X-rays revealed a left impacted intratrochanteric fracture - Admit to MedSurg bed - NPO - Hip fracture protocol initiated - Check PT/APTT and EKG prior to surgery - Incentive spirometry - IV fluids normal saline at 75 ml/hr  - Morphine when necessary pain  Anemia: Hemoglobin 9.7 on admission previously noted to be 10.9 back in 2014 - Type and screen for possible need of blood products - Continue to monitor  Chronic kidney disease stage III: Chronic. Baseline creatinine had been previously between 1.01 and 1.31 - Continue to  monitor   Essential hypertension - Continue to monitor  Anxiety - Ativan IV prn  GERD - Pepcid IV 1 dose and then restart home medications when taking PO   Restart home oral medications when able  DVT prophylaxis: scd Code Status: Full  Family Communication: Discussed overall plan with family at bedside   Disposition Plan: Possible discharge to a acute rehabilitation vs home in 3 or 4 days Consults called:  Orthopedic  Admission status:  Inpatient med surg  Norval Morton MD [facility] Pager [phone]  If 7PM-7AM, please contact night-coverage [email or web address] Password [email or web address]  12/28/2015, 2:27 AM

## 2015-12-28 NOTE — Op Note (Signed)
DATE OF SURGERY:  12/28/2015  TIME: 2:02 PM  PATIENT NAME:  [name]  AGE: 75 y.o.  PRE-OPERATIVE DIAGNOSIS:  left hip fracture  POST-OPERATIVE DIAGNOSIS:  SAME  PROCEDURE:  INTRAMEDULLARY (IM) NAIL LEFT HIP  SURGEON:  [name], TIMOTHY D  ASSISTANT:  Roxan Hockey, PA-C, She was present and scrubbed throughout the case, critical for completion in a timely fashion, and for retraction, instrumentation, and closure.   OPERATIVE IMPLANTS: Stryker Gamma Nail  PREOPERATIVE INDICATIONS:  Christie Bruce is a 75 y.o. year old who fell and suffered a hip fracture. She was brought into the ER and then admitted and optimized and then elected for surgical intervention.    The risks benefits and alternatives were discussed with the patient including but not limited to the risks of nonoperative treatment, versus surgical intervention including infection, bleeding, nerve injury, malunion, nonunion, hardware prominence, hardware failure, need for hardware removal, blood clots, cardiopulmonary complications, morbidity, mortality, among others, and they were willing to proceed.    OPERATIVE PROCEDURE:  The patient was brought to the operating room and placed in the supine position. General anesthesia was administered. She was placed on the fracture table.  Closed reduction was performed under C-arm guidance. Time out was then performed after sterile prep and drape. She received preoperative antibiotics.  Incision was made proximal to the greater trochanter. A guidewire was placed in the appropriate position. Confirmation was made on AP and lateral views. The above-named nail was opened. I opened the proximal femur with a reamer. I then placed the nail by hand easily down. I did not need to ream the femur.  Once the nail was completely seated, I placed a guidepin into the femoral head into the center center position. I measured the length, and then reamed the lateral cortex and up into the head. I then  placed the lag screw. Slight compression was applied. Anatomic fixation achieved. Bone quality was mediocre.  I then secured the proximal interlocking bolt, and took off a half a turn, and then removed the instruments, and took final C-arm pictures AP and lateral the entire length of the leg.   Anatomic reconstruction was achieved, and the wounds were irrigated copiously and closed with Vicryl followed by staples and sterile gauze for the skin. The patient was awakened and returned to PACU in stable and satisfactory condition. There no complications and the patient tolerated the procedure well.  She will be weightbearing as tolerated, and will be on chemical px  for a period of four weeks after discharge.   Edmonia Lynch, M.D.    This note was generated using a template and dragon dictation system. In light of that, I have reviewed the note and all aspects of it are applicable to this case. Any dictation errors are due to the computerized dictation system.

## 2015-12-29 ENCOUNTER — Encounter (HOSPITAL_COMMUNITY): Payer: Self-pay | Admitting: Orthopedic Surgery

## 2015-12-29 DIAGNOSIS — S72002A Fracture of unspecified part of neck of left femur, initial encounter for closed fracture: Secondary | ICD-10-CM

## 2015-12-29 LAB — CBC
HEMATOCRIT: 27.2 % — AB (ref 36.0–46.0)
HEMOGLOBIN: 8.4 g/dL — AB (ref 12.0–15.0)
MCH: 28.5 pg (ref 26.0–34.0)
MCHC: 30.9 g/dL (ref 30.0–36.0)
MCV: 92.2 fL (ref 78.0–100.0)
Platelets: 191 10*3/uL (ref 150–400)
RBC: 2.95 MIL/uL — ABNORMAL LOW (ref 3.87–5.11)
RDW: 14.4 % (ref 11.5–15.5)
WBC: 7.5 10*3/uL (ref 4.0–10.5)

## 2015-12-29 LAB — BASIC METABOLIC PANEL
ANION GAP: 6 (ref 5–15)
BUN: 18 mg/dL (ref 6–20)
CHLORIDE: 106 mmol/L (ref 101–111)
CO2: 24 mmol/L (ref 22–32)
Calcium: 8 mg/dL — ABNORMAL LOW (ref 8.9–10.3)
Creatinine, Ser: 1.29 mg/dL — ABNORMAL HIGH (ref 0.44–1.00)
GFR calc Af Amer: 46 mL/min — ABNORMAL LOW (ref >60)
GFR, EST NON AFRICAN AMERICAN: 39 mL/min — AB (ref >60)
GLUCOSE: 135 mg/dL — AB (ref 65–99)
Potassium: 4.5 mmol/L (ref 3.5–5.1)
Sodium: 136 mmol/L (ref 135–145)

## 2015-12-29 MED ORDER — DOCUSATE SODIUM 100 MG PO CAPS
100.0000 mg | ORAL_CAPSULE | Freq: Two times a day (BID) | ORAL | Status: DC
  Administered 2015-12-29 – 2015-12-30 (×3): 100 mg via ORAL
  Filled 2015-12-29 (×3): qty 1

## 2015-12-29 NOTE — Evaluation (Signed)
Physical Therapy Evaluation Patient Details Name: NEOLA DOCKWEILER MRN: [id number] DOB: 1941/05/20 Today's Date: 12/29/2015   History of Present Illness  Pt is a 75 y/o female who presents s/p mechanical fall at home. Pt sustained a left impacted intertrochanteric fracture and is now s/p IM nailing.   Clinical Impression  Pt admitted with above diagnosis. Pt currently with functional limitations due to the deficits listed below (see PT Problem List). At the time of PT eval pt was able to perform basic transfers with min to mod assist for balance support and safety. Noted buckling of LLE during attempted weightbearing but pt was able to achieve SPT from bed to recliner. Pt is planning on SNF stay at d/c and prefers a location close to [place] as that is where her family is. Pt will benefit from skilled PT to increase their independence and safety with mobility to allow discharge to the venue listed below.     Follow Up Recommendations SNF;Supervision/Assistance - 24 hour    Equipment Recommendations  3in1 (PT)    Recommendations for Other Services       Precautions / Restrictions Precautions Precautions: Fall Restrictions Weight Bearing Restrictions: Yes LLE Weight Bearing: Weight bearing as tolerated      Mobility  Bed Mobility Overal bed mobility: Needs Assistance Bed Mobility: Supine to Sit     Supine to sit: Min assist     General bed mobility comments: Assist for LE movement towards EOB. Pt with heavy use of rails and elevated HOB to elevate trunk into full sitting position.   Transfers Overall transfer level: Needs assistance Equipment used: Rolling walker (2 wheeled) Transfers: Sit to/from [facility] Sit to Stand: Mod assist Stand pivot transfers: Min assist       General transfer comment: Pt was able to power-up to full standing position with mod assist for balance support and safety. Pt very unsteady and LLE was buckling with attempts at weightbearing  initially. Pt was able to achieve a few pivotal steps around to the recliner chair with assist for balance as LLE continued to buckle at times.   Ambulation/Gait             General Gait Details: Did not progress gait training due to safety at this time. Would prefer chair follow for future sessions.  Stairs            Wheelchair Mobility    Modified Rankin (Stroke Patients Only)       Balance Overall balance assessment: Needs assistance Sitting-balance support: No upper extremity supported;Feet supported Sitting balance-Leahy Scale: Fair     Standing balance support: Bilateral upper extremity supported;During functional activity Standing balance-Leahy Scale: Poor                               Pertinent Vitals/Pain Pain Assessment: No/denies pain (At rest in bed)    Home Living Family/patient expects to be discharged to:: Private residence Living Arrangements: Children Available Help at Discharge: Family;Available PRN/intermittently Type of Home: House Home Access: Stairs to enter Entrance Stairs-Rails: Psychiatric nurse of Steps: 5 Home Layout: One level Home Equipment: Environmental consultant - 2 wheels (Lift chair)      Prior Function Level of Independence: Independent               Hand Dominance   Dominant Hand: Right    Extremity/Trunk Assessment   Upper Extremity Assessment: Defer to OT evaluation  Lower Extremity Assessment: LLE deficits/detail   LLE Deficits / Details: Acute pain, decreased strength and AROM consistent with above mentioned procedure.  Cervical / Trunk Assessment: Normal  Communication   Communication: No difficulties  Cognition Arousal/Alertness: Awake/alert Behavior During Therapy: WFL for tasks assessed/performed Overall Cognitive Status: Within Functional Limits for tasks assessed                      General Comments      Exercises        Assessment/Plan    PT  Assessment Patient needs continued PT services  PT Diagnosis Difficulty walking;Acute pain   PT Problem List Decreased strength;Decreased range of motion;Decreased activity tolerance;Decreased balance;Decreased mobility;Decreased knowledge of use of DME;Decreased safety awareness;Decreased knowledge of precautions;Pain  PT Treatment Interventions DME instruction;Gait training;Stair training;Functional mobility training;Therapeutic activities;Therapeutic exercise;Neuromuscular re-education;Patient/family education   PT Goals (Current goals can be found in the Care Plan section) Acute Rehab PT Goals Patient Stated Goal: Return home after rehab PT Goal Formulation: With patient Time For Goal Achievement: 01/12/16 Potential to Achieve Goals: Good    Frequency Min 3X/week   Barriers to discharge        Co-evaluation               End of Session Equipment Utilized During Treatment: Gait belt Activity Tolerance: Patient limited by pain Patient left: in chair;with call bell/phone within reach;with chair alarm set Nurse Communication: Mobility status         Time: [id number] PT Time Calculation (min) (ACUTE ONLY): 34 min   Charges:   PT Evaluation $PT Eval Moderate Complexity: 1 Procedure PT Treatments $Gait Training: 8-22 mins   PT G Codes:        Rolinda Roan 01/02/2016, 3:13 PM   Rolinda Roan, PT, DPT Acute Rehabilitation Services Pager: [phone]

## 2015-12-29 NOTE — NC FL2 (Signed)
[place] LEVEL OF CARE SCREENING TOOL     IDENTIFICATION  Patient Name: Christie Bruce Birthdate: 1940/08/26 Sex: female Admission Date (Current Location): 12/27/2015  [facility] and [place] Number:  Herbalist and Address:  [facility] [place], [place] [address], [place], [place] [address]      Provider Number: [id number]  Attending Physician Name and Address:  Murlean Iba, MD  Relative Name and Phone Number:       Current Level of Care: Hospital Recommended Level of Care: [place] Prior Approval Number:    Date Approved/Denied:   PASRR Number:    Discharge Plan: SNF    Current Diagnoses: Patient Active Problem List   Diagnosis Date Noted  . Hip fracture ([place]) 12/28/2015  . Femur fracture, left ([place]) 12/28/2015  . Anemia 12/28/2015  . CKD (chronic kidney disease), stage III 12/28/2015  . CKD (chronic kidney disease) stage 3, GFR 30-59 ml/min 05/16/2015  . BMI 28.0-28.9,adult 05/13/2015  . Essential hypertension 09/03/2014  . GAD (generalized anxiety disorder) 09/03/2014  . Intrinsic asthma 12/22/2013  . Uterovaginal prolapse, complete 03/31/2013  . 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 GERD (gastroesophageal reflux disease) 09/12/2011    Orientation RESPIRATION BLADDER Height & Weight     Self, Time, Situation, Place  O2 Continent Weight:   Height:     BEHAVIORAL SYMPTOMS/MOOD NEUROLOGICAL BOWEL NUTRITION STATUS      Continent Diet (Please see discharge summary.)  AMBULATORY STATUS COMMUNICATION OF NEEDS Skin    (Has not worked with PT) Verbally Surgical wounds                       Personal Care Assistance Level of Assistance  Bathing, Feeding, Dressing Bathing Assistance: Limited assistance Feeding assistance: Independent Dressing Assistance: Limited assistance     Functional Limitations Info             SPECIAL CARE FACTORS FREQUENCY  PT (By licensed PT), OT (By  licensed OT)     PT Frequency: 5 OT Frequency: 5            Contractures      Additional Factors Info  Code Status, Allergies Code Status Info: FULL Allergies Info: Celebrex, Sulfa Antibiotics           Current Medications (12/29/2015):  This is the current hospital active medication list Current Facility-Administered Medications  Medication Dose Route Frequency Provider Last Rate Last Dose  . 0.9 %  sodium chloride infusion   Intravenous Continuous Norval Morton, MD 75 mL/hr at 12/29/15 0418    . acetaminophen (TYLENOL) tablet 650 mg  650 mg Oral Q6H PRN Clanford Marisa Hua, MD       Or  . acetaminophen (TYLENOL) suppository 650 mg  650 mg Rectal Q6H PRN Clanford L Johnson, MD      . docusate sodium (COLACE) capsule 100 mg  100 mg Oral BID Clanford Marisa Hua, MD   100 mg at 12/29/15 0908  . enoxaparin (LOVENOX) injection 40 mg  40 mg Subcutaneous Q24H Clanford L Johnson, MD   40 mg at 12/29/15 0900  . HYDROcodone-acetaminophen (NORCO/VICODIN) 5-325 MG per tablet 1-2 tablet  1-2 tablet Oral Q6H PRN Norval Morton, MD   2 tablet at 12/29/15 0905  . ipratropium-albuterol (DUONEB) 0.5-2.5 (3) MG/3ML nebulizer solution 3 mL  3 mL Nebulization Q4H PRN Norval Morton, MD      . lactated  ringers infusion   Intravenous Continuous Catalina Gravel, MD 10 mL/hr at 12/28/15 1150    . LORazepam (ATIVAN) injection 0.25 mg  0.25 mg Intravenous BID PRN Norval Morton, MD      . menthol-cetylpyridinium (CEPACOL) lozenge 3 mg  1 lozenge Oral PRN Clanford Marisa Hua, MD       Or  . phenol (CHLORASEPTIC) mouth spray 1 spray  1 spray Mouth/Throat PRN Clanford Marisa Hua, MD      . metoCLOPramide (REGLAN) tablet 5-10 mg  5-10 mg Oral Q8H PRN Clanford Marisa Hua, MD       Or  . metoCLOPramide (REGLAN) injection 5-10 mg  5-10 mg Intravenous Q8H PRN Clanford L Johnson, MD      . morphine 2 MG/ML injection 0.5-1 mg  0.5-1 mg Intravenous Q2H PRN Norval Morton, MD      . ondansetron (ZOFRAN) tablet  4 mg  4 mg Oral Q6H PRN Clanford Marisa Hua, MD       Or  . ondansetron (ZOFRAN) injection 4 mg  4 mg Intravenous Q6H PRN Clanford L Johnson, MD      . oxyCODONE (Oxy IR/ROXICODONE) immediate release tablet 5-10 mg  5-10 mg Oral Q4H PRN Clanford Marisa Hua, MD   10 mg at 12/29/15 1227     Discharge Medications: Please see discharge summary for a list of discharge medications.  Relevant Imaging Results:  Relevant Lab Results:   Additional Information SSN: [id number]  Caroline Sauger, LCSW

## 2015-12-29 NOTE — Clinical Social Work Note (Signed)
PASARR: [id number] Christie Bruce, [facility]: [phone] Surgical: [phone]

## 2015-12-29 NOTE — Clinical Social Work Placement (Signed)
   CLINICAL SOCIAL WORK PLACEMENT  NOTE  Date:  12/29/2015  Patient Details  Name: Christie Bruce MRN: [id number] Date of Birth: 28-Nov-1940  Clinical Social Work is seeking post-discharge placement for this patient at the [place] level of care (*CSW will initial, date and re-position this form in  chart as items are completed):  Yes   Patient/family provided with [facility] Work Department's list of facilities offering this level of care within the geographic area requested by the patient (or if unable, by the patient's family).  Yes   Patient/family informed of their freedom to choose among providers that offer the needed level of care, that participate in Medicare, Medicaid or managed care program needed by the patient, have an available bed and are willing to accept the patient.  Yes   Patient/family informed of [facility]'s ownership interest in [facility] and [facility], as well as of the fact that they are under no obligation to receive care at these facilities.  PASRR submitted to EDS on 12/29/15     PASRR number received on 12/29/15     Existing PASRR number confirmed on       FL2 transmitted to all facilities in geographic area requested by pt/family on 12/29/15     FL2 transmitted to all facilities within larger geographic area on       Patient informed that his/her managed care company has contracts with or will negotiate with certain facilities, including the following:            Patient/family informed of bed offers received.  Patient chooses bed at       Physician recommends and patient chooses bed at      Patient to be transferred to   on  .  Patient to be transferred to facility by       Patient family notified on   of transfer.  Name of family member notified:        PHYSICIAN Please sign FL2     Additional Comment:    _______________________________________________ Caroline Sauger, LCSW 12/29/2015, 12:35  PM

## 2015-12-29 NOTE — Progress Notes (Signed)
   Assessment/Plan: 1 Day Post-Op   POD# 1 S/P Left hip IM Nail by Dr. Ernesta Amble. [name] on 12/28/15  Principal Problem:   Femur fracture, left ([place]) Active Problems:   Anemia - Blood loss minimal during surgery.   CKD (chronic kidney disease), stage III   PLAN: Physical Therapy as ordered   Weight Bearing: Weight Bearing as Tolerated (WBAT) Left leg Dressings: prn VTE prophylaxis: Lovenox, SCDs Dispo: Per primary   Subjective: Patient reports pain as moderate and controlled.  Eating.  No SOB, lightheadedness, or CP.  Objective:   VITALS:   Filed Vitals:   12/28/15 1554 12/28/15 2025 12/29/15 0048 12/29/15 0428  BP: 155/57 113/52 99/52 83/42   Pulse: 60 68 76 64  Temp: 97.8 F (36.6 C) 98.2 F (36.8 C) 98.8 F (37.1 C) 98.4 F (36.9 C)  TempSrc:  Oral Oral Oral  Resp: 18 18 18 17   SpO2: 96% 96% 97% 97%    Lab Results  Component Value Date   WBC 7.5 12/29/2015   HGB 8.4* 12/29/2015   HCT 27.2* 12/29/2015   MCV 92.2 12/29/2015   PLT 191 12/29/2015   BMET    Component Value Date/Time   NA 136 12/29/2015 0350   NA 143 05/13/2015 1526   K 4.5 12/29/2015 0350   CL 106 12/29/2015 0350   CO2 24 12/29/2015 0350   GLUCOSE 135* 12/29/2015 0350   GLUCOSE 107* 05/13/2015 1526   BUN 18 12/29/2015 0350   BUN 23 05/13/2015 1526   CREATININE 1.29* 12/29/2015 0350   CALCIUM 8.0* 12/29/2015 0350   GFRNONAA 39* 12/29/2015 0350   GFRAA 46* 12/29/2015 0350     Physical Exam General: NAD.  Upright in bed awake on arrival.  ABD soft Left Leg: Neurovascular intact, Sensation intact distally,  Dorsiflexion/Plantar flexion intact Resp: No increased wob.  clear to auscultation anterior bilaterally CV: RRR  Dressing: C/D/I    Prudencio Burly III 12/29/2015, 6:19 AM

## 2015-12-29 NOTE — Progress Notes (Signed)
PROGRESS NOTE   12/29/2015 2:48 PM  [name]  [id number]  DOB: 17-Dec-1940  DOA: 12/27/2015 PCP: Chevis Pretty, FNP Outpatient Specialists:   Hospital course: Christie Bruce is a 75 y.o. female who complains of left hip pain after a fall last night at home in the kitchen with feeling of room spinning. She has a history of vertigo. Pain was 7/10 and motion of limb is limited. X-rays of the left hip revealed a impacted intertrochanteric left femur fracture. Orthopedics was consulted for evaluation.  Assessment & Plan: Left hip fracture secondary to fall: Acute. Patient reports having symptoms of vertigo prior to falling at home. X-rays revealed a left impacted intratrochanteric fracture - Admitted to MedSurg bed - Ortho consulted and plan to OR for IM nail by Dr. Percell Miller on 12/28/15 - social worker consulted for rehab placement  Anemia: Following hemoglobin post-op - Type and screen for possible need of blood products - Continue to monitor CBC  Chronic kidney disease stage III: Chronic. Baseline creatinine had been previously between 1.01 and 1.31 - Continue to monitor  Essential hypertension - Continue to monitor  Anxiety - Ativan IV prn  GERD - Pepcid IV 1 dose and then restart home medications when taking PO  DVT prophylaxis: SCDs Code Status: Full Family Communication: no one at bedside Disposition Plan: Likely will need SNF rehab   Consultants:  [facility]  Procedures: Plan for im nail today by Dr. Alain Marion 12/28/15  Subjective: Pt tolerated surgery well   Objective: Filed Vitals:   12/28/15 1554 12/28/15 2025 12/29/15 0048 12/29/15 0428  BP: 155/57 113/52 99/52 83/42   Pulse: 60 68 76 64  Temp: 97.8 F (36.6 C) 98.2 F (36.8 C) 98.8 F (37.1 C) 98.4 F (36.9 C)  TempSrc:  Oral Oral Oral  Resp: 18 18 18 17   SpO2: 96% 96% 97% 97%    Intake/Output Summary (Last 24 hours) at 12/29/15 1448 Last data filed at 12/29/15  0845  Gross per 24 hour  Intake   1240 ml  Output    300 ml  Net    940 ml   There were no vitals filed for this visit.  Exam:  General exam: awake, alert, no distress, cooperative Respiratory system: Clear. No increased work of breathing. Cardiovascular system: S1 & S2 heard, RRR. No JVD, murmurs, gallops, clicks or pedal edema. Gastrointestinal system: Abdomen is nondistended, soft and nontender. Normal bowel sounds heard. Central nervous system: Alert and oriented. No focal neurological deficits. Extremities: bandages clean and dry  Data Reviewed: Basic Metabolic Panel:  Recent Labs Lab 12/28/15 0008 12/28/15 1845 12/29/15 0350  NA 140  --  136  K 4.5  --  4.5  CL 110  --  106  CO2 23  --  24  GLUCOSE 185*  --  135*  BUN 19  --  18  CREATININE 1.25* 1.21* 1.29*  CALCIUM 8.8*  --  8.0*   Liver Function Tests: No results for input(s): AST, ALT, ALKPHOS, BILITOT, PROT, ALBUMIN in the last 168 hours. No results for input(s): LIPASE, AMYLASE in the last 168 hours. No results for input(s): AMMONIA in the last 168 hours. CBC:  Recent Labs Lab 12/28/15 0008 12/29/15 0350  WBC 9.1 7.5  NEUTROABS 7.6  --   HGB 9.7* 8.4*  HCT 31.9* 27.2*  MCV 92.2 92.2  PLT 158 191   Cardiac Enzymes: No results for input(s): CKTOTAL, CKMB, CKMBINDEX, TROPONINI in the last 168 hours. BNP (last  3 results) No results for input(s): PROBNP in the last 8760 hours. CBG: No results for input(s): GLUCAP in the last 168 hours.  Recent Results (from the past 240 hour(s))  Surgical pcr screen     Status: None   Collection Time: 12/28/15  3:55 AM  Result Value Ref Range Status   MRSA, PCR NEGATIVE NEGATIVE Final   Staphylococcus aureus NEGATIVE NEGATIVE Final    Comment:        The Xpert SA Assay (FDA approved for NASAL specimens in patients over 44 years of age), is one component of a comprehensive surveillance program.  Test performance has been validated by [facility] for  patients greater than or equal to 42 year old. It is not intended to diagnose infection nor to guide or monitor treatment.      Studies: Dg Ribs Unilateral W/chest Left  12/27/2015  CLINICAL DATA:  Recent fall with left-sided chest pain, initial encounter EXAM: LEFT RIBS AND CHEST - 3+ VIEW COMPARISON:  05/13/2015 FINDINGS: Cardiac shadow is stable. Mild interstitial changes are noted throughout both lungs stable from the previous exam. No focal infiltrate is seen. No sizable pneumothorax is noted. No definitive rib fracture is identified. IMPRESSION: No rib fracture is identified. Electronically Signed   By: Inez Catalina M.D.   On: 12/27/2015 23:42   Dg Lumbar Spine Complete  12/27/2015  CLINICAL DATA:  Fall with left lower lumbar spine pain. Initial encounter. EXAM: LUMBAR SPINE - COMPLETE 4+ VIEW COMPARISON:  Chest x-ray 05/13/2015 FINDINGS: Compression fracture of T12, L1 inferior endplate, L2 superior endplate, and L4 superior endplate. T12, L1, and L2 fractures are chronic based on comparison chest x-ray. No comparison for the L4 fracture, but chronic appearing. Overall no definitive fracture. No traumatic malalignment. Osteopenia. Usual degenerative changes for age. Cholelithiasis. IMPRESSION: 1. L4 superior endplate fracture, favored chronic. 2. T12, L1, and L2 compression fractures, chronic based on previous imaging. 3. Cholelithiasis. Electronically Signed   By: Monte Fantasia M.D.   On: 12/27/2015 23:46   Ct Head Wo Contrast  12/28/2015  CLINICAL DATA:  Dizziness. Fall with left hip fracture. Initial encounter. EXAM: CT HEAD WITHOUT CONTRAST TECHNIQUE: Contiguous axial images were obtained from the base of the skull through the vertex without intravenous contrast. COMPARISON:  None. FINDINGS: Skull and Sinuses:Negative for fracture or hemo sinus. Visualized orbits: Bilateral cataract resection. No posttraumatic finding. Brain: No evidence of acute infarction, hemorrhage, hydrocephalus, or  mass lesion/mass effect. Mild for age cerebral volume loss and chronic microvascular disease in the cerebral white matter. IMPRESSION: 1. No evidence of intracranial injury. 2. Age congruent senescent changes. Electronically Signed   By: Monte Fantasia M.D.   On: 12/28/2015 00:05   Pelvis Portable  12/28/2015  CLINICAL DATA:  Intra medullary nail placement for left proximal femur fracture EXAM: PORTABLE PELVIS 1-2 VIEWS COMPARISON:  Intraoperative study obtained earlier in the day FINDINGS: Frontal views obtained. There is screw and intra medullary rod/nail placement through a fracture of the intertrochanteric region of the proximal femur. On frontal view, alignment is near anatomic. The tip of the screws in the proximal femoral head. The distal aspect of the rod is in the distal femoral diaphysis near the diaphysis -metaphysis junction. No dislocation. Hip joints appear symmetric and unremarkable. IMPRESSION: Alignment near anatomic in the intertrochanteric region. No dislocation. Tip of screw in proximal left femoral head. Electronically Signed   By: Lowella Grip III M.D.   On: 12/28/2015 15:27   Dg Hip Operative Unilat  W Or W/o Pelvis Left  12/28/2015  CLINICAL DATA:  Postoperative fixation left hip fracture EXAM: OPERATIVE LEFT HIP   2 VIEWS TECHNIQUE: Fluoroscopic spot image(s) were submitted for interpretation post-operatively. FLUOROSCOPY TIME:  0 MINUTES 31 SECONDS; 3 ACQUIRED IMAGES COMPARISON:  December 27, 2015 FINDINGS: Frontal and lateral views were obtained. There is screw and rod fixation through an intertrochanteric femur fracture on the left. Alignment at fracture site is essentially anatomic. The tip of the screw is in the proximal femoral head. No dislocation. IMPRESSION: Alignment is near anatomic at the intertrochanteric fracture site. No dislocation. Tip of screw in proximal femoral head. Electronically Signed   By: Lowella Grip III M.D.   On: 12/28/2015 14:30   Dg Hip Unilat With  Pelvis 2-3 Views Left  12/27/2015  CLINICAL DATA:  Fall.  Rotated left hip.  Initial encounter. EXAM: DG HIP (WITH OR WITHOUT PELVIS) 2-3V LEFT COMPARISON:  None. FINDINGS: Acute intertrochanteric left femur fracture with medial impaction. No evidence of pelvic ring fracture or diastasis. Osteopenia.  No notable degenerative changes. IMPRESSION: Impacted intertrochanteric left femur fracture. Electronically Signed   By: Monte Fantasia M.D.   On: 12/27/2015 23:40    Scheduled Meds: . docusate sodium  100 mg Oral BID  . enoxaparin (LOVENOX) injection  40 mg Subcutaneous Q24H   Continuous Infusions: . sodium chloride 75 mL/hr at 12/29/15 0418  . lactated ringers 10 mL/hr at 12/28/15 1150    Principal Problem:   Femur fracture, left ([facility]) Active Problems:   Anemia   CKD (chronic kidney disease), stage III   Time spent: 20 mins   Irwin Brakeman, MD, FAAFP [facility] Pager [phone]  If 7PM-7AM, please contact night-coverage [email or web address] Password [email or web address] 12/29/2015, 2:48 PM    LOS: 1 day

## 2015-12-30 DIAGNOSIS — D6489 Other specified anemias: Secondary | ICD-10-CM | POA: Diagnosis not present

## 2015-12-30 DIAGNOSIS — S72002A Fracture of unspecified part of neck of left femur, initial encounter for closed fracture: Secondary | ICD-10-CM | POA: Diagnosis not present

## 2015-12-30 DIAGNOSIS — N183 Chronic kidney disease, stage 3 (moderate): Secondary | ICD-10-CM | POA: Diagnosis not present

## 2015-12-30 DIAGNOSIS — S72145A Nondisplaced intertrochanteric fracture of left femur, initial encounter for closed fracture: Secondary | ICD-10-CM | POA: Diagnosis not present

## 2015-12-30 DIAGNOSIS — K219 Gastro-esophageal reflux disease without esophagitis: Secondary | ICD-10-CM | POA: Diagnosis not present

## 2015-12-30 DIAGNOSIS — S728X9A Other fracture of unspecified femur, initial encounter for closed fracture: Secondary | ICD-10-CM | POA: Diagnosis not present

## 2015-12-30 DIAGNOSIS — S72142D Displaced intertrochanteric fracture of left femur, subsequent encounter for closed fracture with routine healing: Secondary | ICD-10-CM | POA: Diagnosis not present

## 2015-12-30 DIAGNOSIS — D649 Anemia, unspecified: Secondary | ICD-10-CM | POA: Diagnosis not present

## 2015-12-30 DIAGNOSIS — R262 Difficulty in walking, not elsewhere classified: Secondary | ICD-10-CM | POA: Diagnosis not present

## 2015-12-30 DIAGNOSIS — S72145D Nondisplaced intertrochanteric fracture of left femur, subsequent encounter for closed fracture with routine healing: Secondary | ICD-10-CM | POA: Diagnosis not present

## 2015-12-30 DIAGNOSIS — Z419 Encounter for procedure for purposes other than remedying health state, unspecified: Secondary | ICD-10-CM | POA: Diagnosis not present

## 2015-12-30 DIAGNOSIS — I10 Essential (primary) hypertension: Secondary | ICD-10-CM | POA: Diagnosis not present

## 2015-12-30 DIAGNOSIS — S72092A Other fracture of head and neck of left femur, initial encounter for closed fracture: Secondary | ICD-10-CM | POA: Diagnosis not present

## 2015-12-30 DIAGNOSIS — M25552 Pain in left hip: Secondary | ICD-10-CM | POA: Diagnosis not present

## 2015-12-30 DIAGNOSIS — R259 Unspecified abnormal involuntary movements: Secondary | ICD-10-CM | POA: Diagnosis not present

## 2015-12-30 DIAGNOSIS — R279 Unspecified lack of coordination: Secondary | ICD-10-CM | POA: Diagnosis not present

## 2015-12-30 DIAGNOSIS — J0100 Acute maxillary sinusitis, unspecified: Secondary | ICD-10-CM | POA: Diagnosis not present

## 2015-12-30 DIAGNOSIS — M6281 Muscle weakness (generalized): Secondary | ICD-10-CM | POA: Diagnosis not present

## 2015-12-30 LAB — CBC
HCT: 26.4 % — ABNORMAL LOW (ref 36.0–46.0)
HEMOGLOBIN: 8.1 g/dL — AB (ref 12.0–15.0)
MCH: 28.2 pg (ref 26.0–34.0)
MCHC: 30.7 g/dL (ref 30.0–36.0)
MCV: 92 fL (ref 78.0–100.0)
PLATELETS: 165 10*3/uL (ref 150–400)
RBC: 2.87 MIL/uL — ABNORMAL LOW (ref 3.87–5.11)
RDW: 14.3 % (ref 11.5–15.5)
WBC: 7.6 10*3/uL (ref 4.0–10.5)

## 2015-12-30 LAB — BASIC METABOLIC PANEL
Anion gap: 8 (ref 5–15)
BUN: 22 mg/dL — AB (ref 6–20)
CHLORIDE: 106 mmol/L (ref 101–111)
CO2: 22 mmol/L (ref 22–32)
CREATININE: 1.32 mg/dL — AB (ref 0.44–1.00)
Calcium: 8.2 mg/dL — ABNORMAL LOW (ref 8.9–10.3)
GFR calc Af Amer: 45 mL/min — ABNORMAL LOW (ref >60)
GFR calc non Af Amer: 38 mL/min — ABNORMAL LOW (ref >60)
Glucose, Bld: 103 mg/dL — ABNORMAL HIGH (ref 65–99)
POTASSIUM: 4.7 mmol/L (ref 3.5–5.1)
Sodium: 136 mmol/L (ref 135–145)

## 2015-12-30 MED ORDER — ACETAMINOPHEN 325 MG PO TABS: 650.0000 mg | ORAL_TABLET | Freq: Four times a day (QID) | ORAL | Status: DC | PRN

## 2015-12-30 MED ORDER — ASPIRIN 325 MG PO TABS
325.0000 mg | ORAL_TABLET | Freq: Every day | ORAL | Status: DC
  Administered 2015-12-30: 325 mg via ORAL
  Filled 2015-12-30: qty 1

## 2015-12-30 MED ORDER — DOCUSATE SODIUM 100 MG PO CAPS: 100.0000 mg | ORAL_CAPSULE | Freq: Two times a day (BID) | ORAL | Status: DC

## 2015-12-30 MED ORDER — OMEPRAZOLE 40 MG PO CPDR: 40.0000 mg | DELAYED_RELEASE_CAPSULE | Freq: Every day | ORAL | Status: DC

## 2015-12-30 MED ORDER — HYDROCODONE-ACETAMINOPHEN 5-325 MG PO TABS: 1.0000 | ORAL_TABLET | Freq: Four times a day (QID) | ORAL | Status: DC | PRN

## 2015-12-30 MED ORDER — ASPIRIN 325 MG PO TABS: 325.0000 mg | ORAL_TABLET | Freq: Every day | ORAL | Status: DC

## 2015-12-30 MED ORDER — ASPIRIN EC 325 MG PO TBEC: 325.0000 mg | DELAYED_RELEASE_TABLET | Freq: Every day | ORAL | Status: DC

## 2015-12-30 MED ORDER — ALPRAZOLAM 0.25 MG PO TABS: 0.2500 mg | ORAL_TABLET | Freq: Two times a day (BID) | ORAL | Status: DC | PRN

## 2015-12-30 MED ORDER — CETIRIZINE HCL 10 MG PO TABS: 10.0000 mg | ORAL_TABLET | Freq: Every day | ORAL | Status: DC | PRN

## 2015-12-30 NOTE — Anesthesia Postprocedure Evaluation (Signed)
Anesthesia Post Note  Patient: Christie Bruce  Procedure(s) Performed: Procedure(s) (LRB): INTRAMEDULLARY (IM) NAIL LEFT HIP (Left)  Patient location during evaluation: PACU Anesthesia Type: General Level of consciousness: awake and alert Pain management: pain level controlled Vital Signs Assessment: post-procedure vital signs reviewed and stable Respiratory status: spontaneous breathing, nonlabored ventilation, respiratory function stable and patient connected to nasal cannula oxygen Cardiovascular status: blood pressure returned to baseline and stable Postop Assessment: no signs of nausea or vomiting Anesthetic complications: no    Last Vitals:  Filed Vitals:   12/29/15 1935 12/30/15 0547  BP: 118/55 136/60  Pulse: 86 97  Temp: 37.3 C 37.3 C  Resp: 16 18    Last Pain:  Filed Vitals:   12/30/15 [id number]  PainSc: 4                  Catalina Gravel

## 2015-12-30 NOTE — Care Management Important Message (Signed)
Important Message  Patient Details  Name: Christie Bruce MRN: [id number] Date of Birth: Apr 21, 1941   Medicare Important Message Given:  Yes    [name] [place] 12/30/2015, 11:12 AM

## 2015-12-30 NOTE — Progress Notes (Signed)
Reviewed discharge instructions/medication with patient and patient's son. Answered questions.  [place] 1:46 PM 12/30/2015

## 2015-12-30 NOTE — Progress Notes (Signed)
Called report to the [facility] in [place].  Talked to [place].

## 2015-12-30 NOTE — Discharge Summary (Signed)
Physician Discharge Summary  Christie Bruce [id number] DOB: 1940-10-31 DOA: 12/27/2015  PCP: Chevis Pretty, FNP  Admit date: 12/27/2015 Discharge date: 12/30/2015  Disposition:  [facility]  Recommendations for Outpatient Follow-up:  1. Follow up with PCP in 1-2 weeks 2. Please obtain BMP/CBC in one week 3. Please follow up with orthopedics as scheduled.   Discharge Condition:stable CODE STATUS:Full Diet recommendation: Heart Healthy    Brief/Interim Summary: Hospital course: MEAGON DEGOLIER is a 75 y.o. female who complains of left hip pain after a fall last night at home in the kitchen with feeling of room spinning. She has a history of vertigo. Pain was 7/10 and motion of limb is limited. X-rays of the left hip revealed a impacted intertrochanteric left femur fracture. Orthopedics was consulted for evaluation. Assessment & Plan: Left hip fracture secondary to fall: Acute. Patient reports having symptoms of vertigo prior to falling at home. X-rays revealed a left impacted intratrochanteric fracture - Admitted to MedSurg bed - Ortho consulted and plan to OR for IM nail by Dr. Percell Miller on 12/28/15 - social worker consulted for rehab placement to [facility].  - surgery has recommended patient take aspirin for at least 30 days for DVT prevention and continue SCDs  Anemia: Following hemoglobin post-op has been stable.  - Type and screen for possible need of blood products - Continue to monitor CBC Hg 8.1 at discharge.   Chronic kidney disease stage III: Chronic. Baseline creatinine had been previously between 1.01 and 1.31 - Continue to monitor - has been stable.   Essential hypertension - resuming home meds at discharge  Anxiety - stable, resuming home meds at discharge  GERD - stable on home omeprazole  Discharge Diagnoses:  Principal Problem:   Femur fracture, left ([place]) Active Problems:   Anemia   CKD (chronic kidney disease), stage III   Hip fracture,  left [facility])  Discharge Instructions  Discharge Instructions    Diet - low sodium heart healthy    Complete by:  As directed      Discharge instructions    Complete by:  As directed   Please use SCDs while in bed  Follow up with orthopedics as scheduled Follow up with PCP in 2 weeks Return if symptoms recur, worsen or new problems develop.     Increase activity slowly    Complete by:  As directed             Medication List    STOP taking these medications        meloxicam 7.5 MG tablet  Commonly known as:  MOBIC      TAKE these medications        acetaminophen 325 MG tablet  Commonly known as:  TYLENOL  Take 2 tablets (650 mg total) by mouth every 6 (six) hours as needed for mild pain or moderate pain (or Fever >/= 101).     albuterol 108 (90 Base) MCG/ACT inhaler  Commonly known as:  PROAIR HFA  Inhale 2 puffs into the lungs every 6 (six) hours as needed for wheezing or shortness of breath.     ALPRAZolam 0.25 MG tablet  Commonly known as:  XANAX  Take 1 tablet (0.25 mg total) by mouth 2 (two) times daily as needed for anxiety.     aspirin EC 325 MG tablet  Take 1 tablet (325 mg total) by mouth daily.     cetirizine 10 MG tablet  Commonly known as:  ZYRTEC  Take 1  tablet (10 mg total) by mouth daily as needed for allergies or rhinitis.     docusate sodium 100 MG capsule  Commonly known as:  COLACE  Take 1 capsule (100 mg total) by mouth 2 (two) times daily.     fluticasone 50 MCG/ACT nasal spray  Commonly known as:  FLONASE  Place 2 sprays into both nostrils daily.     HYDROcodone-acetaminophen 5-325 MG tablet  Commonly known as:  NORCO/VICODIN  Take 1 tablet by mouth every 6 (six) hours as needed for severe pain.     lisinopril-hydrochlorothiazide 20-25 MG tablet  Commonly known as:  PRINZIDE,ZESTORETIC  TAKE ONE (1) TABLET EACH DAY     omeprazole 40 MG capsule  Commonly known as:  PRILOSEC  Take 1 capsule (40 mg total) by mouth daily.            Follow-up Information    Follow up with [name], TIMOTHY D, MD In 2 weeks.   Specialty:  Orthopedic Surgery   Contact information:   [address] [place] [address] [phone]       Follow up with Chevis Pretty, FNP In 1 week.   Specialty:  Family Medicine   Contact information:   [address] [place] [address] [phone]      Allergies  Allergen Reactions  . Celebrex [Celecoxib]   . Sulfa Antibiotics     Had sores in my mouth, bottom of my feet "the skin just came right off"    Consultations:  orthopedics   Procedures/Studies: Dg Ribs Unilateral W/chest Left  12/27/2015  CLINICAL DATA:  Recent fall with left-sided chest pain, initial encounter EXAM: LEFT RIBS AND CHEST - 3+ VIEW COMPARISON:  05/13/2015 FINDINGS: Cardiac shadow is stable. Mild interstitial changes are noted throughout both lungs stable from the previous exam. No focal infiltrate is seen. No sizable pneumothorax is noted. No definitive rib fracture is identified. IMPRESSION: No rib fracture is identified. Electronically Signed   By: Inez Catalina M.D.   On: 12/27/2015 23:42   Dg Lumbar Spine Complete  12/27/2015  CLINICAL DATA:  Fall with left lower lumbar spine pain. Initial encounter. EXAM: LUMBAR SPINE - COMPLETE 4+ VIEW COMPARISON:  Chest x-ray 05/13/2015 FINDINGS: Compression fracture of T12, L1 inferior endplate, L2 superior endplate, and L4 superior endplate. T12, L1, and L2 fractures are chronic based on comparison chest x-ray. No comparison for the L4 fracture, but chronic appearing. Overall no definitive fracture. No traumatic malalignment. Osteopenia. Usual degenerative changes for age. Cholelithiasis. IMPRESSION: 1. L4 superior endplate fracture, favored chronic. 2. T12, L1, and L2 compression fractures, chronic based on previous imaging. 3. Cholelithiasis. Electronically Signed   By: Monte Fantasia M.D.   On: 12/27/2015 23:46   Ct Head Wo Contrast  12/28/2015   CLINICAL DATA:  Dizziness. Fall with left hip fracture. Initial encounter. EXAM: CT HEAD WITHOUT CONTRAST TECHNIQUE: Contiguous axial images were obtained from the base of the skull through the vertex without intravenous contrast. COMPARISON:  None. FINDINGS: Skull and Sinuses:Negative for fracture or hemo sinus. Visualized orbits: Bilateral cataract resection. No posttraumatic finding. Brain: No evidence of acute infarction, hemorrhage, hydrocephalus, or mass lesion/mass effect. Mild for age cerebral volume loss and chronic microvascular disease in the cerebral white matter. IMPRESSION: 1. No evidence of intracranial injury. 2. Age congruent senescent changes. Electronically Signed   By: Monte Fantasia M.D.   On: 12/28/2015 00:05   Pelvis Portable  12/28/2015  CLINICAL DATA:  Intra medullary nail  placement for left proximal femur fracture EXAM: PORTABLE PELVIS 1-2 VIEWS COMPARISON:  Intraoperative study obtained earlier in the day FINDINGS: Frontal views obtained. There is screw and intra medullary rod/nail placement through a fracture of the intertrochanteric region of the proximal femur. On frontal view, alignment is near anatomic. The tip of the screws in the proximal femoral head. The distal aspect of the rod is in the distal femoral diaphysis near the diaphysis -metaphysis junction. No dislocation. Hip joints appear symmetric and unremarkable. IMPRESSION: Alignment near anatomic in the intertrochanteric region. No dislocation. Tip of screw in proximal left femoral head. Electronically Signed   By: Lowella Grip III M.D.   On: 12/28/2015 15:27   Dg Hip Operative Unilat W Or W/o Pelvis Left  12/28/2015  CLINICAL DATA:  Postoperative fixation left hip fracture EXAM: OPERATIVE LEFT HIP   2 VIEWS TECHNIQUE: Fluoroscopic spot image(s) were submitted for interpretation post-operatively. FLUOROSCOPY TIME:  0 MINUTES 31 SECONDS; 3 ACQUIRED IMAGES COMPARISON:  December 27, 2015 FINDINGS: Frontal and lateral views  were obtained. There is screw and rod fixation through an intertrochanteric femur fracture on the left. Alignment at fracture site is essentially anatomic. The tip of the screw is in the proximal femoral head. No dislocation. IMPRESSION: Alignment is near anatomic at the intertrochanteric fracture site. No dislocation. Tip of screw in proximal femoral head. Electronically Signed   By: Lowella Grip III M.D.   On: 12/28/2015 14:30   Dg Hip Unilat With Pelvis 2-3 Views Left  12/27/2015  CLINICAL DATA:  Fall.  Rotated left hip.  Initial encounter. EXAM: DG HIP (WITH OR WITHOUT PELVIS) 2-3V LEFT COMPARISON:  None. FINDINGS: Acute intertrochanteric left femur fracture with medial impaction. No evidence of pelvic ring fracture or diastasis. Osteopenia.  No notable degenerative changes. IMPRESSION: Impacted intertrochanteric left femur fracture. Electronically Signed   By: Monte Fantasia M.D.   On: 12/27/2015 23:40    (Echo, Carotid, EGD, Colonoscopy, ERCP)    Subjective: Pt awake, alert in no distress, ready for rehab  Discharge Exam: Filed Vitals:   12/29/15 1935 12/30/15 0547  BP: 118/55 136/60  Pulse: 86 97  Temp: 99.2 F (37.3 C) 99.1 F (37.3 C)  Resp: 16 18   Filed Vitals:   12/29/15 0428 12/29/15 1500 12/29/15 1935 12/30/15 0547  BP: 83/42 93/50 118/55 136/60  Pulse: 64 77 86 97  Temp: 98.4 F (36.9 C) 99.1 F (37.3 C) 99.2 F (37.3 C) 99.1 F (37.3 C)  TempSrc: Oral Oral Oral Oral  Resp: 17 18 16 18   SpO2: 97% 92% 93% 94%   General exam: awake, alert, no distress, cooperative Respiratory system: Clear. No increased work of breathing. Cardiovascular system: S1 & S2 heard, RRR. No JVD, murmurs, gallops, clicks or pedal edema. Gastrointestinal system: Abdomen is nondistended, soft and nontender. Normal bowel sounds heard. Central nervous system: Alert and oriented. No focal neurological deficits. Extremities: bandages clean and dry  The results of significant diagnostics  from this hospitalization (including imaging, microbiology, ancillary and laboratory) are listed below for reference.     Microbiology: Recent Results (from the past 240 hour(s))  Surgical pcr screen     Status: None   Collection Time: 12/28/15  3:55 AM  Result Value Ref Range Status   MRSA, PCR NEGATIVE NEGATIVE Final   Staphylococcus aureus NEGATIVE NEGATIVE Final    Comment:        The Xpert SA Assay (FDA approved for NASAL specimens in patients over 52 years of age),  is one component of a comprehensive surveillance program.  Test performance has been validated by [facility] for patients greater than or equal to 83 year old. It is not intended to diagnose infection nor to guide or monitor treatment.     Labs: BNP (last 3 results) No results for input(s): BNP in the last 8760 hours. Basic Metabolic Panel:  Recent Labs Lab 12/28/15 0008 12/28/15 1845 12/29/15 0350 12/30/15 0352  NA 140  --  136 136  K 4.5  --  4.5 4.7  CL 110  --  106 106  CO2 23  --  24 22  GLUCOSE 185*  --  135* 103*  BUN 19  --  18 22*  CREATININE 1.25* 1.21* 1.29* 1.32*  CALCIUM 8.8*  --  8.0* 8.2*   Liver Function Tests: No results for input(s): AST, ALT, ALKPHOS, BILITOT, PROT, ALBUMIN in the last 168 hours. No results for input(s): LIPASE, AMYLASE in the last 168 hours. No results for input(s): AMMONIA in the last 168 hours. CBC:  Recent Labs Lab 12/28/15 0008 12/29/15 0350 12/30/15 0352  WBC 9.1 7.5 7.6  NEUTROABS 7.6  --   --   HGB 9.7* 8.4* 8.1*  HCT 31.9* 27.2* 26.4*  MCV 92.2 92.2 92.0  PLT 158 191 165   Cardiac Enzymes: No results for input(s): CKTOTAL, CKMB, CKMBINDEX, TROPONINI in the last 168 hours. BNP: Invalid input(s): POCBNP CBG: No results for input(s): GLUCAP in the last 168 hours. D-Dimer No results for input(s): DDIMER in the last 72 hours. Hgb A1c No results for input(s): HGBA1C in the last 72 hours. Lipid Profile No results for input(s): CHOL, HDL,  LDLCALC, TRIG, CHOLHDL, LDLDIRECT in the last 72 hours. Thyroid function studies No results for input(s): TSH, T4TOTAL, T3FREE, THYROIDAB in the last 72 hours.  Invalid input(s): FREET3 Anemia work up No results for input(s): VITAMINB12, FOLATE, FERRITIN, TIBC, IRON, RETICCTPCT in the last 72 hours. Urinalysis No results found for: COLORURINE, APPEARANCEUR, [place], [place], [place], [place], [place], [place], PROTEINUR, UROBILINOGEN, NITRITE, LEUKOCYTESUR Sepsis Labs Invalid input(s): PROCALCITONIN,  WBC,  LACTICIDVEN Microbiology Recent Results (from the past 240 hour(s))  Surgical pcr screen     Status: None   Collection Time: 12/28/15  3:55 AM  Result Value Ref Range Status   MRSA, PCR NEGATIVE NEGATIVE Final   Staphylococcus aureus NEGATIVE NEGATIVE Final    Comment:        The Xpert SA Assay (FDA approved for NASAL specimens in patients over 46 years of age), is one component of a comprehensive surveillance program.  Test performance has been validated by [facility] for patients greater than or equal to 16 year old. It is not intended to diagnose infection nor to guide or monitor treatment.    Time coordinating discharge: Over 30 minutes  SIGNED:   Irwin Brakeman, MD  [facility] 12/30/2015, 12:14 PM Pager   If 7PM-7AM, please contact night-coverage [email or web address] Password [email or web address]

## 2015-12-30 NOTE — Progress Notes (Signed)
OT Cancellation Note  Patient Details Name: Christie Bruce MRN: [id number] DOB: [birth year]   Cancelled Treatment:    Reason Eval/Treat Not Completed: Other (comment). Per chart pt is to D/C to SNF today, will defer OT eval to that facility unless D/C is delayed and OT eval needed prior to D/C. Office [phone], weekend pager [phone]  [name] [id number] 12/30/2015, 12:14 PM

## 2015-12-30 NOTE — Clinical Social Work Note (Addendum)
Patient will discharge today per MD order. Patient will discharge to: [facility] RN to call report prior to transportation to: [phone] Transportation: [facility] to be called by RN after dc summ/order in [phone]  CSW sent discharge summary to SNF for review.  Packet is complete.  RN, patient and family aware of discharge plans.  Nonnie Done, LCSW [phone]  [id number], 2S 15-16 and Psychiatric Service Line  Licensed Clinical Social Worker

## 2015-12-30 NOTE — Discharge Instructions (Signed)
Please use SCDs while in bed  Follow up with orthopedics as scheduled Follow up with PCP in 2 weeks Return if symptoms recur, worsen or new problems develop.  Aspiration Precautions Aspiration is the breathing in (inhalation) of a liquid or object into the lungs. Things that can be inhaled into the lungs include:   Food.  Any type of liquid, such as drinks or saliva.  Stomach contents, such as vomit or stomach acid. When these things go into the lungs, damage can occur and serious complications can result, such as:  Lung infection (pneumonia).  Collection of infected liquid (pus) in the lungs (lung abscess).  Death. CAUSES The cause of aspiration may include:   A lowered level of awareness (consciousness) due to:  Traumatic brain injury or head injury.  Stroke.  Diseases of the nerves, brain, or spinal cord.  Seizures.  A problem with the gag reflex. The gag reflex protects the body from swallowing things too quickly or things that are too large.  Medical conditions that affect swallowing.  Conditions that affect the food pipe (esophagus).  Acid reflux. This is when stomach acid moves into the esophagus.  Any type of surgery where a medicine to sleep (general anesthetic)or relax (sedative) is given.  Alcohol abuse.  Illegal drug abuse.  Taking medicine that causes sleepiness, confusion, or weakness.  Aging.  Dental problems.  Having a feeding tube. SIGNS AND SYMPTOMS Symptoms of aspiration may include:   Coughing after swallowing food or liquids.  Difficulty breathing. This may include:  Breathing quickly.  Breathing very slowly.  Loud breathing.  Rumbling sounds from the lungs while breathing.  Coughing up phlegm (sputum) that:  Is yellow, tan, or green.  Has pieces of food in it.  Is bad smelling.  A change in voice so that it sounds scratchy.  A change in skin color. The skin may look red or blue.   Fever.  Watery eyes.  Pain  in the chest or back.  A pained look on the face.   A feeling of fullness in the throat or that something is stuck in the throat. DIAGNOSIS Aspiration may be diagnosed by:   Chest X-ray.  Bronchoscopy. This is a surgical procedure in which a thin, flexible tube with a camera is inserted into the nose or mouth to the lungs. The health care provider can then view the lungs.  A swallowing evaluation study to find out:  A person's risk of aspiration.  How difficult it is for a person to swallow.  What types of foods are safe for a person to eat. PREVENTION If you are caring for someone who can eat and drink through his or her mouth:   Have the person sit in an upright position when eating food or drinking fluids, such as:  Sitting up in a chair.  If sitting in a chair is not possible, position the person in bed so he or she is upright.  Remind the person to eat slowly and chew well.  Do not distract the person. This is especially important for people with thinking or memory (cognitive) problems.  Check the person's mouth for leftover food after eating.  Keep the person sitting upright for 30-45 minutes after eating.  Do not serve food or drink for at least 2 hours before bedtime. If you are caring for someone with a feeding tube who cannot eat or drink through his or her mouth:  Keep the person in an upright position as much as  possible.  Do not  lay the person flat if he or she is getting continuous feedings. Turn the feeding pump off if you need to lay the person flat for any reason.  Check feeding tube residuals as directed by your health care provider. Ask your health care provider what residual amount is too high. General guidelines to prevent aspiration in someone you are caring for include:  Feed small amounts of food. Do not force feed.  Food should be thickened as directed by the person's speech pathologist.  Use as little water as possible when brushing the  person's teeth or cleaning his or her mouth.  Provide oral care before and after meals.  Never put food or liquids in the mouth of a person who is not fully alert.  Crush pills and put them in soft food such as pudding or ice cream. Some pills should not be crushed. Check with your health care provider before crushing any medicine. SEEK MEDICAL CARE IF:  The person has a feeding tube and the feeding tube residual amount is too high.  The person has a fever.  The person tries to avoid food, such as refusing to eat or be fed, or is eating less than normal. SEEK IMMEDIATE MEDICAL CARE IF:   The person has trouble breathing or starts to breathe quickly.  The person is breathing very slowly or stops breathing.  The person coughs a lot after eating or drinking.  The person has a long-lasting (chronic) cough.  The person coughs up thick, yellow, or tan sputum. MAKE SURE YOU:   Understand these instructions.  Will watch the person's condition.  Will get help right away if the person is not doing well or gets worse.   This information is not intended to replace advice given to you by your health care provider. Make sure you discuss any questions you have with your health care provider.   Document Released: 08/11/2010 Document Revised: 07/30/2014 Document Reviewed: 10/14/2013 Elsevier Interactive Patient Education 2016 Else[employer] Inc[employer]  Hip Fracture A hip fracture is a fracture of the upper part of your thigh bone (femur).  CAUSES A hip fracture is caused by a direct blow to the side of your hip. This is usually the result of a fall but can occur in other circumstances, such as an automobile accident. RISK FACTORS There is an increased risk of hip fractures in people with:  An unsteady walking pattern (gait) and those with conditions that contribute to poor balance, such as Parkinson's disease or dementia.  Osteopenia and osteoporosis.  Cancer that spreads to the leg  bones.  Certain metabolic diseases. SYMPTOMS  Symptoms of hip fracture include:  Pain over the injured hip.  Inability to put weight on the leg in which the fracture occurred (although, some patients are able to walk after a hip fracture).  Toes and foot of the affected leg point outward when you lie down. DIAGNOSIS A physical exam can determine if a hip fracture is likely to have occurred. X-ray exams are needed to confirm the fracture and to look for other injuries. The X-ray exam can help to determine the type of hip fracture. Rarely, the fracture is not visible on an X-ray image and a CT scan or MRI will have to be done. TREATMENT  The treatment for a fracture is usually surgery. This means using a screw, nail, or rod to hold the bones in place.  HOME CARE INSTRUCTIONS Take all medicines as directed by your health  care provider. SEEK MEDICAL CARE IF: Pain continues, even after taking pain medicine. MAKE SURE YOU:  Understand these instructions.   Will watch your condition.  Will get help right away if you are not doing well or get worse.   This information is not intended to replace advice given to you by your health care provider. Make sure you discuss any questions you have with your health care provider.   Document Released: 07/09/2005 Document Revised: 07/14/2013 Document Reviewed: 02/18/2013 Elsevier Interactive Patient Education 2016 [employer].  Refer to this sheet in the next few weeks. These instructions provide you with information about caring for yourself after your procedure. Your health care provider may also give you more specific instructions. Your treatment has been planned according to current medical practices, but problems sometimes occur. Call your health care provider if you have any problems or questions after your procedure. WHAT TO EXPECT AFTER THE PROCEDURE After your procedure, it is typical to have the  following:  Pain.  Swelling.  Difficulty walking. HOME CARE INSTRUCTIONS  Have someone help you with everyday activities, such as showering and meals, for the first week after you leave the hospital or as directed.  Take medicines only as directed by your health care provider. Do not take any over-the-counter medicines without approval from your health care provider. Medicines such as aspirin and ibuprofen can increase your risk of bleeding.  Constipation is common after surgery from the pain medicines used and the decrease in your activity level. It is important to drink fluids and to increase your intake of fruits and vegetables. Stool softeners may be prescribed by your health care provider.  There are many different ways to close and cover an incision, including stitches, skin glue, and adhesive strips. Follow your health care provider's instructions about:  Incision care.  Bandage (dressing) changes and removal.  Incision closure removal.  Wear compression stockings as directed by your health care provider. These stockings help to prevent blood clots and reduce swelling in your legs.  Do not take baths, swim, or use a hot tub until your health care provider approves.  Use crutches or a walker as directed by your health care provider.  Be sure to do any exercises that your physical therapist suggests. These exercises will help to make your hip stronger and help you to recover more quickly.  Ask your health care provider when you can resume other activities, such as work, driving, and sex.  Do not drive or operate heavy machinery while taking pain medicine.  Keep all follow-up visits as directed by your health care provider. This is important. SEEK MEDICAL CARE IF:  You have fatigue.  You feel weak.  Your pain is not relieved by medicines.  You have a fever.  You have drainage, redness, swelling, or pain at your incision. SEEK IMMEDIATE MEDICAL CARE IF:  Your  incision is bleeding.  Your leg or foot is painful and swollen.  Your leg is pale or blue.  Your leg is cold.  Your leg tingles or is numb.  You have trouble breathing.  You have chest pain.   This information is not intended to replace advice given to you by your health care provider. Make sure you discuss any questions you have with your health care provider.   Document Released: 02/03/2014 Document Reviewed: 02/03/2014 Elsevier Interactive Patient Education [employer].

## 2015-12-30 NOTE — Progress Notes (Signed)
   Assessment/Plan: 2 Days Post-Op   POD# 2 S/P Left hip IM Nail by Dr. Ernesta Amble. Christie Bruce on 12/28/15  Principal Problem:   Femur fracture, left ([place]) Active Problems:   Anemia - Blood loss minimal during surgery.   CKD (chronic kidney disease), stage III   PLAN: Physical Therapy as ordered  Change Lovenox to ASA dt CKD.  Patient states that she has taken ASA in the past without adverse reaction. She will follow up with Dr. Percell Bruce in the office in about 2 weeks.  Weight Bearing: Weight Bearing as Tolerated (WBAT) Left leg Dressings: prn VTE prophylaxis: SCDs, Aspirin x 30 days Dispo: Per primary - SNF   Subjective: Patient reports pain as mild and controlled.  Feeling "a whole lot better today" - less sore and able to move around more.  Eating.  No SOB, lightheadedness, or CP.  Objective:   VITALS:   Filed Vitals:   12/29/15 0428 12/29/15 1500 12/29/15 1935 12/30/15 0547  BP: 83/42 93/50 118/55 136/60  Pulse: 64 77 86 97  Temp: 98.4 F (36.9 C) 99.1 F (37.3 C) 99.2 F (37.3 C) 99.1 F (37.3 C)  TempSrc: Oral Oral Oral Oral  Resp: 17 18 16 18   SpO2: 97% 92% 93% 94%    Lab Results  Component Value Date   WBC 7.6 12/30/2015   HGB 8.1* 12/30/2015   HCT 26.4* 12/30/2015   MCV 92.0 12/30/2015   PLT 165 12/30/2015   BMET    Component Value Date/Time   NA 136 12/30/2015 0352   NA 143 05/13/2015 1526   K 4.7 12/30/2015 0352   CL 106 12/30/2015 0352   CO2 22 12/30/2015 0352   GLUCOSE 103* 12/30/2015 0352   GLUCOSE 107* 05/13/2015 1526   BUN 22* 12/30/2015 0352   BUN 23 05/13/2015 1526   CREATININE 1.32* 12/30/2015 0352   CALCIUM 8.2* 12/30/2015 0352   GFRNONAA 38* 12/30/2015 0352   GFRAA 45* 12/30/2015 0352    Physical Exam General: NAD.  Upright in bed awake on arrival eating breakfast.  Left Leg: Neurovascular intact, Sensation intact distally,  Dorsiflexion/Plantar flexion intact Dressing: C/D/I w/ scant drainage.   Christie Bruce  III 12/30/2015, 7:34 AM

## 2016-01-02 DIAGNOSIS — N183 Chronic kidney disease, stage 3 (moderate): Secondary | ICD-10-CM | POA: Diagnosis not present

## 2016-01-02 DIAGNOSIS — I10 Essential (primary) hypertension: Secondary | ICD-10-CM | POA: Diagnosis not present

## 2016-01-02 DIAGNOSIS — S72092A Other fracture of head and neck of left femur, initial encounter for closed fracture: Secondary | ICD-10-CM | POA: Diagnosis not present

## 2016-01-02 DIAGNOSIS — K219 Gastro-esophageal reflux disease without esophagitis: Secondary | ICD-10-CM | POA: Diagnosis not present

## 2016-01-02 DIAGNOSIS — D6489 Other specified anemias: Secondary | ICD-10-CM | POA: Diagnosis not present

## 2016-01-18 DIAGNOSIS — S72142D Displaced intertrochanteric fracture of left femur, subsequent encounter for closed fracture with routine healing: Secondary | ICD-10-CM | POA: Diagnosis not present

## 2016-01-20 DIAGNOSIS — R42 Dizziness and giddiness: Secondary | ICD-10-CM | POA: Diagnosis not present

## 2016-01-20 DIAGNOSIS — I129 Hypertensive chronic kidney disease with stage 1 through stage 4 chronic kidney disease, or unspecified chronic kidney disease: Secondary | ICD-10-CM | POA: Diagnosis not present

## 2016-01-20 DIAGNOSIS — M25552 Pain in left hip: Secondary | ICD-10-CM | POA: Diagnosis not present

## 2016-01-20 DIAGNOSIS — S72142D Displaced intertrochanteric fracture of left femur, subsequent encounter for closed fracture with routine healing: Secondary | ICD-10-CM | POA: Diagnosis not present

## 2016-01-20 DIAGNOSIS — K219 Gastro-esophageal reflux disease without esophagitis: Secondary | ICD-10-CM | POA: Diagnosis not present

## 2016-01-20 DIAGNOSIS — N183 Chronic kidney disease, stage 3 (moderate): Secondary | ICD-10-CM | POA: Diagnosis not present

## 2016-01-23 DIAGNOSIS — N183 Chronic kidney disease, stage 3 (moderate): Secondary | ICD-10-CM | POA: Diagnosis not present

## 2016-01-23 DIAGNOSIS — K219 Gastro-esophageal reflux disease without esophagitis: Secondary | ICD-10-CM | POA: Diagnosis not present

## 2016-01-23 DIAGNOSIS — I129 Hypertensive chronic kidney disease with stage 1 through stage 4 chronic kidney disease, or unspecified chronic kidney disease: Secondary | ICD-10-CM | POA: Diagnosis not present

## 2016-01-23 DIAGNOSIS — S72142D Displaced intertrochanteric fracture of left femur, subsequent encounter for closed fracture with routine healing: Secondary | ICD-10-CM | POA: Diagnosis not present

## 2016-01-23 DIAGNOSIS — R42 Dizziness and giddiness: Secondary | ICD-10-CM | POA: Diagnosis not present

## 2016-01-25 DIAGNOSIS — I129 Hypertensive chronic kidney disease with stage 1 through stage 4 chronic kidney disease, or unspecified chronic kidney disease: Secondary | ICD-10-CM | POA: Diagnosis not present

## 2016-01-25 DIAGNOSIS — N183 Chronic kidney disease, stage 3 (moderate): Secondary | ICD-10-CM | POA: Diagnosis not present

## 2016-01-25 DIAGNOSIS — R42 Dizziness and giddiness: Secondary | ICD-10-CM | POA: Diagnosis not present

## 2016-01-25 DIAGNOSIS — K219 Gastro-esophageal reflux disease without esophagitis: Secondary | ICD-10-CM | POA: Diagnosis not present

## 2016-01-25 DIAGNOSIS — S72142D Displaced intertrochanteric fracture of left femur, subsequent encounter for closed fracture with routine healing: Secondary | ICD-10-CM | POA: Diagnosis not present

## 2016-01-26 ENCOUNTER — Ambulatory Visit (INDEPENDENT_AMBULATORY_CARE_PROVIDER_SITE_OTHER): Payer: Medicare Other | Admitting: Family Medicine

## 2016-01-26 ENCOUNTER — Encounter: Payer: Self-pay | Admitting: Family Medicine

## 2016-01-26 VITALS — BP 141/72 | HR 80 | Temp 98.9°F | Ht 62.5 in | Wt 145.2 lb

## 2016-01-26 DIAGNOSIS — N183 Chronic kidney disease, stage 3 unspecified: Secondary | ICD-10-CM

## 2016-01-26 DIAGNOSIS — I10 Essential (primary) hypertension: Secondary | ICD-10-CM | POA: Diagnosis not present

## 2016-01-26 MED ORDER — CHLORTHALIDONE 25 MG PO TABS: 25.0000 mg | ORAL_TABLET | Freq: Every day | ORAL | Status: DC

## 2016-01-26 NOTE — Progress Notes (Signed)
Subjective:    Patient ID: Christie Bruce, female    DOB: Feb 03, 1941, 75 y.o.   MRN: [id number]  HPI 75 year old female who suffered a left hip fracture June 6  She spent some time in the acute care hospital but then transferred to rehabilitation where she spent 20 days. While there she had some issues with swelling of her feet. During the hospitalization and since it has been noted that her renal function has declined markedly. Creatinine clearance is around 39 and creatinine has gone up as high as 2.3. It is now back down to 1.3. Since she has gone home swelling has resolved .  Patient Active Problem List   Diagnosis Date Noted  . Hip fracture, left ([place])   . Hip fracture ([place]) 12/28/2015  . Femur fracture, left ([place]) 12/28/2015  . Anemia 12/28/2015  . CKD (chronic kidney disease), stage III 12/28/2015  . CKD (chronic kidney disease) stage 3, GFR 30-59 ml/min 05/16/2015  . BMI 28.0-28.9,adult 05/13/2015  . Essential hypertension 09/03/2014  . GAD (generalized anxiety disorder) 09/03/2014  . Intrinsic asthma 12/22/2013  . Uterovaginal prolapse, complete 03/31/2013  . 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 GERD (gastroesophageal reflux disease) 09/12/2011   Outpatient Encounter Prescriptions as of 01/26/2016  Medication Sig  . acetaminophen (TYLENOL) 325 MG tablet Take 2 tablets (650 mg total) by mouth every 6 (six) hours as needed for mild pain or moderate pain (or Fever >/= 101).  Marland Kitchen albuterol (PROAIR HFA) 108 (90 BASE) MCG/ACT inhaler Inhale 2 puffs into the lungs every 6 (six) hours as needed for wheezing or shortness of breath.  . ALPRAZolam (XANAX) 0.25 MG tablet Take 1 tablet (0.25 mg total) by mouth 2 (two) times daily as needed for anxiety.  Marland Kitchen aspirin EC 325 MG tablet Take 1 tablet (325 mg total) by mouth daily.  . cetirizine (ZYRTEC) 10 MG tablet Take 1 tablet (10 mg total) by mouth daily as needed for allergies or rhinitis.  Marland Kitchen docusate sodium (COLACE) 100 MG  capsule Take 1 capsule (100 mg total) by mouth 2 (two) times daily.  . fluticasone (FLONASE) 50 MCG/ACT nasal spray Place 2 sprays into both nostrils daily.  Marland Kitchen HYDROcodone-acetaminophen (NORCO/VICODIN) 5-325 MG tablet Take 1 tablet by mouth every 6 (six) hours as needed for severe pain.  Marland Kitchen lisinopril-hydrochlorothiazide (PRINZIDE,ZESTORETIC) 20-25 MG tablet TAKE ONE (1) TABLET EACH DAY  . omeprazole (PRILOSEC) 40 MG capsule Take 1 capsule (40 mg total) by mouth daily.   No facility-administered encounter medications on file as of 01/26/2016.      Review of Systems  Constitutional: Negative.   Respiratory: Negative.   Cardiovascular: Negative.   Musculoskeletal: Positive for gait problem.  Neurological: Negative.   Psychiatric/Behavioral: Negative.        Objective:   Physical Exam  Constitutional: She is oriented to person, place, and time. She appears well-developed and well-nourished.  Cardiovascular: Normal rate and normal heart sounds.   Pulmonary/Chest: Effort normal and breath sounds normal.  Musculoskeletal: She exhibits no edema.  Ambulates with a walker  Neurological: She is alert and oriented to person, place, and time.   BP 141/72 mmHg  Pulse 80  Temp(Src) 98.9 F (37.2 C) (Oral)  Ht 5' 2.5" (1.588 m)  Wt 145 lb 3.2 oz (65.862 kg)  BMI 26.12 kg/m2        Assessment & Plan:  1. Essential hypertension Blood pressure is good at 141/72. Medicine includes lisinopril and hydrochlorothiazide  2. CKD (  chronic kidney disease) stage 3, GFR 30-59 ml/min Unsure why the decline in renal function unless long-standing hypertension. She has no diabetes and no heart failure. She admittedly does not drink enough water. Offer Rx for chlorthalidone to take as needed Wardell Honour MD

## 2016-01-30 DIAGNOSIS — N183 Chronic kidney disease, stage 3 (moderate): Secondary | ICD-10-CM | POA: Diagnosis not present

## 2016-01-30 DIAGNOSIS — R42 Dizziness and giddiness: Secondary | ICD-10-CM | POA: Diagnosis not present

## 2016-01-30 DIAGNOSIS — S72142D Displaced intertrochanteric fracture of left femur, subsequent encounter for closed fracture with routine healing: Secondary | ICD-10-CM | POA: Diagnosis not present

## 2016-01-30 DIAGNOSIS — K219 Gastro-esophageal reflux disease without esophagitis: Secondary | ICD-10-CM | POA: Diagnosis not present

## 2016-01-30 DIAGNOSIS — I129 Hypertensive chronic kidney disease with stage 1 through stage 4 chronic kidney disease, or unspecified chronic kidney disease: Secondary | ICD-10-CM | POA: Diagnosis not present

## 2016-02-01 DIAGNOSIS — I129 Hypertensive chronic kidney disease with stage 1 through stage 4 chronic kidney disease, or unspecified chronic kidney disease: Secondary | ICD-10-CM | POA: Diagnosis not present

## 2016-02-01 DIAGNOSIS — R42 Dizziness and giddiness: Secondary | ICD-10-CM | POA: Diagnosis not present

## 2016-02-01 DIAGNOSIS — K219 Gastro-esophageal reflux disease without esophagitis: Secondary | ICD-10-CM | POA: Diagnosis not present

## 2016-02-01 DIAGNOSIS — N183 Chronic kidney disease, stage 3 (moderate): Secondary | ICD-10-CM | POA: Diagnosis not present

## 2016-02-01 DIAGNOSIS — S72142D Displaced intertrochanteric fracture of left femur, subsequent encounter for closed fracture with routine healing: Secondary | ICD-10-CM | POA: Diagnosis not present

## 2016-02-02 ENCOUNTER — Ambulatory Visit: Payer: Medicare Other | Admitting: Nurse Practitioner

## 2016-02-03 DIAGNOSIS — N183 Chronic kidney disease, stage 3 (moderate): Secondary | ICD-10-CM | POA: Diagnosis not present

## 2016-02-03 DIAGNOSIS — R42 Dizziness and giddiness: Secondary | ICD-10-CM | POA: Diagnosis not present

## 2016-02-03 DIAGNOSIS — I129 Hypertensive chronic kidney disease with stage 1 through stage 4 chronic kidney disease, or unspecified chronic kidney disease: Secondary | ICD-10-CM | POA: Diagnosis not present

## 2016-02-03 DIAGNOSIS — K219 Gastro-esophageal reflux disease without esophagitis: Secondary | ICD-10-CM | POA: Diagnosis not present

## 2016-02-03 DIAGNOSIS — S72142D Displaced intertrochanteric fracture of left femur, subsequent encounter for closed fracture with routine healing: Secondary | ICD-10-CM | POA: Diagnosis not present

## 2016-02-07 ENCOUNTER — Ambulatory Visit (INDEPENDENT_AMBULATORY_CARE_PROVIDER_SITE_OTHER): Payer: Medicare Other | Admitting: Family Medicine

## 2016-02-07 ENCOUNTER — Encounter: Payer: Self-pay | Admitting: Family Medicine

## 2016-02-07 VITALS — BP 89/54 | HR 80 | Temp 97.8°F | Ht 62.5 in | Wt 142.6 lb

## 2016-02-07 DIAGNOSIS — N183 Chronic kidney disease, stage 3 unspecified: Secondary | ICD-10-CM

## 2016-02-07 DIAGNOSIS — S72142D Displaced intertrochanteric fracture of left femur, subsequent encounter for closed fracture with routine healing: Secondary | ICD-10-CM | POA: Diagnosis not present

## 2016-02-07 DIAGNOSIS — K219 Gastro-esophageal reflux disease without esophagitis: Secondary | ICD-10-CM | POA: Diagnosis not present

## 2016-02-07 DIAGNOSIS — R42 Dizziness and giddiness: Secondary | ICD-10-CM | POA: Diagnosis not present

## 2016-02-07 DIAGNOSIS — M79671 Pain in right foot: Secondary | ICD-10-CM

## 2016-02-07 DIAGNOSIS — M79672 Pain in left foot: Secondary | ICD-10-CM | POA: Diagnosis not present

## 2016-02-07 DIAGNOSIS — I129 Hypertensive chronic kidney disease with stage 1 through stage 4 chronic kidney disease, or unspecified chronic kidney disease: Secondary | ICD-10-CM | POA: Diagnosis not present

## 2016-02-07 DIAGNOSIS — R739 Hyperglycemia, unspecified: Secondary | ICD-10-CM | POA: Diagnosis not present

## 2016-02-07 LAB — BAYER DCA HB A1C WAIVED: HB A1C: 5.4 % (ref <7.0)

## 2016-02-07 MED ORDER — TRAMADOL HCL 50 MG PO TABS: 25.0000 mg | ORAL_TABLET | Freq: Three times a day (TID) | ORAL | Status: DC | PRN

## 2016-02-07 MED ORDER — LISINOPRIL 10 MG PO TABS: 10.0000 mg | ORAL_TABLET | Freq: Every day | ORAL | Status: DC

## 2016-02-07 NOTE — Progress Notes (Signed)
   HPI  Patient presents today with bilateral foot pain and swelling.  Patient explains that since stopping meloxicam she's had increased foot pain and difficulty tolerating physical therapy. She states that this is similar to the pain she had while she was riding center. She is not given any NSAIDs there either.  The pain is described as bilateral tingling type pain. She states that the bottoms of her feet, the pads of her feet, swelling up and become hard feeling.  She states that with mobic she had no problems. She was told to stop meloxicam due to renal dysfunction.  PMH: Smoking status noted ROS: Per HPI  Objective: BP 89/54 mmHg  Pulse 80  Temp(Src) 97.8 F (36.6 C) (Oral)  Ht 5' 2.5" (1.588 m)  Wt 142 lb 9.6 oz (64.683 kg)  BMI 25.65 kg/m2 Gen: NAD, alert, cooperative with exam HEENT: NCAT CV: RRR, good S1/S2, no murmur Resp: CTABL, no wheezes, non-labored Ext: No edema, warm Neuro: Alert and oriented, No gross deficits  Calf Circumference 34 cm on the right, 33 cm on the left Bilateral feet with 2+ dorsalis pedis pulses, no injuries, no appreciable swelling  Assessment and plan:  # Foot pain bilateral, CK D stage III. Discussed and confirmed that she should avoid NSAIDs Trial of tramadol to use sparingly, 25 mg to start in carefully titrated Possibly neuropathy, checking A1c today, previous glucose 187. Other labs as well.  Hypertension Low today, increased to 105/55 with manual cuff after talking for a bit Asymptomatic She is on lisinopril/HCTZ, chlorthalidone, and took a Lasix one day ago. 2 thiazide diuretics at full dose Discontinue Lasix, discontinue lisinopril HCTZ, decreased dose of lisinopril to 10 mg Continue chlorthalidone Follow-up one month  Laroy Apple, MD [facility] 02/07/2016, 4:28 PM

## 2016-02-07 NOTE — Patient Instructions (Signed)
Great to see you!  Lets have you stop Lisinopril/HCTZ and start only lisinopril 10 mg.  Chlorthalidone is a fluid pill as well, continue this one.   Try 1/2 to 1 tramadol for pain.   We can try compression stockings as well if we need to.

## 2016-02-08 ENCOUNTER — Ambulatory Visit (INDEPENDENT_AMBULATORY_CARE_PROVIDER_SITE_OTHER): Payer: Medicare Other | Admitting: Obstetrics & Gynecology

## 2016-02-08 ENCOUNTER — Encounter: Payer: Self-pay | Admitting: Obstetrics & Gynecology

## 2016-02-08 VITALS — BP 122/70 | HR 72 | Wt 142.9 lb

## 2016-02-08 DIAGNOSIS — N813 Complete uterovaginal prolapse: Secondary | ICD-10-CM | POA: Diagnosis not present

## 2016-02-08 DIAGNOSIS — N183 Chronic kidney disease, stage 3 (moderate): Secondary | ICD-10-CM | POA: Diagnosis not present

## 2016-02-08 DIAGNOSIS — N760 Acute vaginitis: Secondary | ICD-10-CM

## 2016-02-08 DIAGNOSIS — S72142D Displaced intertrochanteric fracture of left femur, subsequent encounter for closed fracture with routine healing: Secondary | ICD-10-CM | POA: Diagnosis not present

## 2016-02-08 DIAGNOSIS — K219 Gastro-esophageal reflux disease without esophagitis: Secondary | ICD-10-CM | POA: Diagnosis not present

## 2016-02-08 DIAGNOSIS — B9689 Other specified bacterial agents as the cause of diseases classified elsewhere: Secondary | ICD-10-CM

## 2016-02-08 DIAGNOSIS — F411 Generalized anxiety disorder: Secondary | ICD-10-CM | POA: Diagnosis not present

## 2016-02-08 DIAGNOSIS — A499 Bacterial infection, unspecified: Secondary | ICD-10-CM | POA: Diagnosis not present

## 2016-02-08 DIAGNOSIS — I129 Hypertensive chronic kidney disease with stage 1 through stage 4 chronic kidney disease, or unspecified chronic kidney disease: Secondary | ICD-10-CM | POA: Diagnosis not present

## 2016-02-08 DIAGNOSIS — R42 Dizziness and giddiness: Secondary | ICD-10-CM | POA: Diagnosis not present

## 2016-02-08 LAB — CMP14+EGFR
ALT: 14 IU/L (ref 0–32)
AST: 28 IU/L (ref 0–40)
Albumin/Globulin Ratio: 1.1 — ABNORMAL LOW (ref 1.2–2.2)
Albumin: 3.8 g/dL (ref 3.5–4.8)
Alkaline Phosphatase: 145 IU/L — ABNORMAL HIGH (ref 39–117)
BUN/Creatinine Ratio: 24 (ref 12–28)
BUN: 37 mg/dL — AB (ref 8–27)
Bilirubin Total: 0.7 mg/dL (ref 0.0–1.2)
CO2: 22 mmol/L (ref 18–29)
CREATININE: 1.52 mg/dL — AB (ref 0.57–1.00)
Calcium: 9.1 mg/dL (ref 8.7–10.3)
Chloride: 98 mmol/L (ref 96–106)
GFR calc Af Amer: 38 mL/min/{1.73_m2} — ABNORMAL LOW (ref >59)
GFR, EST NON AFRICAN AMERICAN: 33 mL/min/{1.73_m2} — AB (ref >59)
Globulin, Total: 3.4 g/dL (ref 1.5–4.5)
Glucose: 94 mg/dL (ref 65–99)
Potassium: 4.4 mmol/L (ref 3.5–5.2)
Sodium: 141 mmol/L (ref 134–144)
Total Protein: 7.2 g/dL (ref 6.0–8.5)

## 2016-02-08 LAB — CBC WITH DIFFERENTIAL/PLATELET
BASOS: 0 %
Basophils Absolute: 0 10*3/uL (ref 0.0–0.2)
EOS (ABSOLUTE): 0.2 10*3/uL (ref 0.0–0.4)
EOS: 3 %
HEMATOCRIT: 28.6 % — AB (ref 34.0–46.6)
Hemoglobin: 8.9 g/dL — CL (ref 11.1–15.9)
IMMATURE GRANS (ABS): 0 10*3/uL (ref 0.0–0.1)
IMMATURE GRANULOCYTES: 0 %
LYMPHS: 34 %
Lymphocytes Absolute: 2.5 10*3/uL (ref 0.7–3.1)
MCH: 27.9 pg (ref 26.6–33.0)
MCHC: 31.1 g/dL — ABNORMAL LOW (ref 31.5–35.7)
MCV: 90 fL (ref 79–97)
MONOCYTES: 11 %
Monocytes Absolute: 0.8 10*3/uL (ref 0.1–0.9)
NEUTROS PCT: 52 %
Neutrophils Absolute: 3.7 10*3/uL (ref 1.4–7.0)
PLATELETS: 298 10*3/uL (ref 150–379)
RBC: 3.19 x10E6/uL — ABNORMAL LOW (ref 3.77–5.28)
RDW: 14.8 % (ref 12.3–15.4)
WBC: 7.1 10*3/uL (ref 3.4–10.8)

## 2016-02-08 MED ORDER — METRONIDAZOLE 500 MG PO TABS: 500.0000 mg | ORAL_TABLET | Freq: Three times a day (TID) | ORAL | Status: DC

## 2016-02-08 NOTE — Progress Notes (Signed)
Patient ID: Christie Bruce, female   DOB: 24-Oct-1940, 75 y.o.   MRN: [id number] Chief Complaint  Patient presents with  . Pessary Check    c/o vaginal odor    Blood pressure 122/70, pulse 72, weight 142 lb 14.4 oz (64.819 kg).  Christie Bruce presents today for routine follow up related to her pessary.   She uses a milex ring #2 She reports moderate vaginal discharge or vaginal bleeding.  Exam reveals no undue vaginal mucosal pressure of breakdown, little discharge and little vaginal bleeding.  The pessary is removed, cleaned and replaced without difficulty.    Christie Bruce will be sen back in 4 months for continued follow up.  Meds ordered this encounter  Medications  . metroNIDAZOLE (FLAGYL) 500 MG tablet    Sig: Take 1 tablet (500 mg total) by mouth 3 (three) times daily.    Dispense:  14 tablet    Refill:  0     Florian Buff, MD   02/08/2016 3:30 PM

## 2016-02-09 ENCOUNTER — Telehealth: Payer: Self-pay | Admitting: Nurse Practitioner

## 2016-02-09 NOTE — Telephone Encounter (Signed)
Pt's daughter is aware 

## 2016-02-10 ENCOUNTER — Emergency Department (HOSPITAL_COMMUNITY): Payer: Medicare Other

## 2016-02-10 ENCOUNTER — Encounter (HOSPITAL_COMMUNITY): Payer: Self-pay | Admitting: *Deleted

## 2016-02-10 ENCOUNTER — Inpatient Hospital Stay (HOSPITAL_COMMUNITY)
Admission: EM | Admit: 2016-02-10 | Discharge: 2016-02-15 | DRG: 418 | Disposition: A | Discharge status: Home Health | Payer: Medicare Other | Attending: Internal Medicine | Admitting: Internal Medicine

## 2016-02-10 DIAGNOSIS — Z87891 Personal history of nicotine dependence: Secondary | ICD-10-CM

## 2016-02-10 DIAGNOSIS — I96 Gangrene, not elsewhere classified: Secondary | ICD-10-CM | POA: Diagnosis not present

## 2016-02-10 DIAGNOSIS — N1832 Chronic kidney disease, stage 3b: Secondary | ICD-10-CM | POA: Diagnosis present

## 2016-02-10 DIAGNOSIS — Z87442 Personal history of urinary calculi: Secondary | ICD-10-CM

## 2016-02-10 DIAGNOSIS — I129 Hypertensive chronic kidney disease with stage 1 through stage 4 chronic kidney disease, or unspecified chronic kidney disease: Secondary | ICD-10-CM | POA: Diagnosis not present

## 2016-02-10 DIAGNOSIS — Z8249 Family history of ischemic heart disease and other diseases of the circulatory system: Secondary | ICD-10-CM

## 2016-02-10 DIAGNOSIS — R197 Diarrhea, unspecified: Secondary | ICD-10-CM | POA: Diagnosis not present

## 2016-02-10 DIAGNOSIS — Z9013 Acquired absence of bilateral breasts and nipples: Secondary | ICD-10-CM | POA: Diagnosis not present

## 2016-02-10 DIAGNOSIS — Z808 Family history of malignant neoplasm of other organs or systems: Secondary | ICD-10-CM

## 2016-02-10 DIAGNOSIS — K8000 Calculus of gallbladder with acute cholecystitis without obstruction: Secondary | ICD-10-CM | POA: Diagnosis not present

## 2016-02-10 DIAGNOSIS — F411 Generalized anxiety disorder: Secondary | ICD-10-CM | POA: Diagnosis present

## 2016-02-10 DIAGNOSIS — Z833 Family history of diabetes mellitus: Secondary | ICD-10-CM | POA: Diagnosis not present

## 2016-02-10 DIAGNOSIS — R1084 Generalized abdominal pain: Secondary | ICD-10-CM | POA: Diagnosis not present

## 2016-02-10 DIAGNOSIS — R112 Nausea with vomiting, unspecified: Secondary | ICD-10-CM | POA: Diagnosis not present

## 2016-02-10 DIAGNOSIS — Z801 Family history of malignant neoplasm of trachea, bronchus and lung: Secondary | ICD-10-CM

## 2016-02-10 DIAGNOSIS — N183 Chronic kidney disease, stage 3 unspecified: Secondary | ICD-10-CM | POA: Diagnosis present

## 2016-02-10 DIAGNOSIS — K810 Acute cholecystitis: Secondary | ICD-10-CM | POA: Diagnosis present

## 2016-02-10 DIAGNOSIS — D638 Anemia in other chronic diseases classified elsewhere: Secondary | ICD-10-CM | POA: Diagnosis not present

## 2016-02-10 DIAGNOSIS — Z853 Personal history of malignant neoplasm of breast: Secondary | ICD-10-CM | POA: Diagnosis not present

## 2016-02-10 DIAGNOSIS — R1031 Right lower quadrant pain: Secondary | ICD-10-CM | POA: Diagnosis not present

## 2016-02-10 DIAGNOSIS — I10 Essential (primary) hypertension: Secondary | ICD-10-CM | POA: Diagnosis present

## 2016-02-10 DIAGNOSIS — Z823 Family history of stroke: Secondary | ICD-10-CM | POA: Diagnosis not present

## 2016-02-10 DIAGNOSIS — E876 Hypokalemia: Secondary | ICD-10-CM | POA: Diagnosis not present

## 2016-02-10 DIAGNOSIS — R1110 Vomiting, unspecified: Secondary | ICD-10-CM | POA: Diagnosis not present

## 2016-02-10 DIAGNOSIS — K219 Gastro-esophageal reflux disease without esophagitis: Secondary | ICD-10-CM | POA: Diagnosis present

## 2016-02-10 HISTORY — DX: Unspecified asthma, uncomplicated: J45.909

## 2016-02-10 LAB — CBC
HEMATOCRIT: 29.4 % — AB (ref 36.0–46.0)
Hemoglobin: 9.3 g/dL — ABNORMAL LOW (ref 12.0–15.0)
MCH: 28.3 pg (ref 26.0–34.0)
MCHC: 31.6 g/dL (ref 30.0–36.0)
MCV: 89.4 fL (ref 78.0–100.0)
PLATELETS: 377 10*3/uL (ref 150–400)
RBC: 3.29 MIL/uL — AB (ref 3.87–5.11)
RDW: 14.5 % (ref 11.5–15.5)
WBC: 14.6 10*3/uL — ABNORMAL HIGH (ref 4.0–10.5)

## 2016-02-10 LAB — LIPASE, BLOOD: Lipase: 17 U/L (ref 11–51)

## 2016-02-10 LAB — COMPREHENSIVE METABOLIC PANEL
ALT: 14 U/L (ref 14–54)
AST: 26 U/L (ref 15–41)
Albumin: 3.7 g/dL (ref 3.5–5.0)
Alkaline Phosphatase: 127 U/L — ABNORMAL HIGH (ref 38–126)
Anion gap: 12 (ref 5–15)
BUN: 39 mg/dL — AB (ref 6–20)
CHLORIDE: 103 mmol/L (ref 101–111)
CO2: 22 mmol/L (ref 22–32)
CREATININE: 1.28 mg/dL — AB (ref 0.44–1.00)
Calcium: 9.3 mg/dL (ref 8.9–10.3)
GFR calc non Af Amer: 40 mL/min — ABNORMAL LOW (ref >60)
GFR, EST AFRICAN AMERICAN: 46 mL/min — AB (ref >60)
Glucose, Bld: 172 mg/dL — ABNORMAL HIGH (ref 65–99)
POTASSIUM: 4.1 mmol/L (ref 3.5–5.1)
SODIUM: 137 mmol/L (ref 135–145)
Total Bilirubin: 0.9 mg/dL (ref 0.3–1.2)
Total Protein: 8.2 g/dL — ABNORMAL HIGH (ref 6.5–8.1)

## 2016-02-10 MED ORDER — ONDANSETRON HCL 4 MG/2ML IJ SOLN
4.0000 mg | Freq: Once | INTRAMUSCULAR | Status: AC
  Administered 2016-02-10: 4 mg via INTRAVENOUS
  Filled 2016-02-10: qty 2

## 2016-02-10 MED ORDER — SODIUM CHLORIDE 0.9 % IV BOLUS (SEPSIS)
1000.0000 mL | Freq: Once | INTRAVENOUS | Status: AC
  Administered 2016-02-10: 1000 mL via INTRAVENOUS

## 2016-02-10 MED ORDER — FENTANYL CITRATE (PF) 100 MCG/2ML IJ SOLN
25.0000 ug | Freq: Once | INTRAMUSCULAR | Status: AC
  Administered 2016-02-10: 25 ug via INTRAVENOUS
  Filled 2016-02-10: qty 2

## 2016-02-10 NOTE — ED Notes (Signed)
Pt c/o abdominal pain that radiates in to her back since earlier today. Pt also reporting n/v.

## 2016-02-10 NOTE — ED Notes (Signed)
Pt reports upper abdominal pain says it sore. Pt says has been coughing.

## 2016-02-11 ENCOUNTER — Inpatient Hospital Stay (HOSPITAL_COMMUNITY): Payer: Medicare Other

## 2016-02-11 ENCOUNTER — Other Ambulatory Visit (HOSPITAL_COMMUNITY): Payer: Medicare Other

## 2016-02-11 ENCOUNTER — Encounter (HOSPITAL_COMMUNITY): Payer: Self-pay | Admitting: Internal Medicine

## 2016-02-11 ENCOUNTER — Emergency Department (HOSPITAL_COMMUNITY): Payer: Medicare Other

## 2016-02-11 DIAGNOSIS — K819 Cholecystitis, unspecified: Secondary | ICD-10-CM | POA: Diagnosis not present

## 2016-02-11 DIAGNOSIS — I10 Essential (primary) hypertension: Secondary | ICD-10-CM

## 2016-02-11 DIAGNOSIS — Z853 Personal history of malignant neoplasm of breast: Secondary | ICD-10-CM | POA: Diagnosis not present

## 2016-02-11 DIAGNOSIS — K8012 Calculus of gallbladder with acute and chronic cholecystitis without obstruction: Secondary | ICD-10-CM | POA: Diagnosis not present

## 2016-02-11 DIAGNOSIS — Z808 Family history of malignant neoplasm of other organs or systems: Secondary | ICD-10-CM | POA: Diagnosis not present

## 2016-02-11 DIAGNOSIS — K810 Acute cholecystitis: Secondary | ICD-10-CM

## 2016-02-11 DIAGNOSIS — K8020 Calculus of gallbladder without cholecystitis without obstruction: Secondary | ICD-10-CM | POA: Diagnosis not present

## 2016-02-11 DIAGNOSIS — Z801 Family history of malignant neoplasm of trachea, bronchus and lung: Secondary | ICD-10-CM | POA: Diagnosis not present

## 2016-02-11 DIAGNOSIS — K219 Gastro-esophageal reflux disease without esophagitis: Secondary | ICD-10-CM | POA: Diagnosis present

## 2016-02-11 DIAGNOSIS — I129 Hypertensive chronic kidney disease with stage 1 through stage 4 chronic kidney disease, or unspecified chronic kidney disease: Secondary | ICD-10-CM | POA: Diagnosis not present

## 2016-02-11 DIAGNOSIS — F411 Generalized anxiety disorder: Secondary | ICD-10-CM | POA: Diagnosis present

## 2016-02-11 DIAGNOSIS — N183 Chronic kidney disease, stage 3 (moderate): Secondary | ICD-10-CM | POA: Diagnosis not present

## 2016-02-11 DIAGNOSIS — K8000 Calculus of gallbladder with acute cholecystitis without obstruction: Secondary | ICD-10-CM | POA: Diagnosis not present

## 2016-02-11 DIAGNOSIS — D638 Anemia in other chronic diseases classified elsewhere: Secondary | ICD-10-CM | POA: Diagnosis not present

## 2016-02-11 DIAGNOSIS — Z833 Family history of diabetes mellitus: Secondary | ICD-10-CM | POA: Diagnosis not present

## 2016-02-11 DIAGNOSIS — Z87891 Personal history of nicotine dependence: Secondary | ICD-10-CM | POA: Diagnosis not present

## 2016-02-11 DIAGNOSIS — Z9013 Acquired absence of bilateral breasts and nipples: Secondary | ICD-10-CM | POA: Diagnosis not present

## 2016-02-11 DIAGNOSIS — R1031 Right lower quadrant pain: Secondary | ICD-10-CM | POA: Diagnosis present

## 2016-02-11 DIAGNOSIS — I96 Gangrene, not elsewhere classified: Secondary | ICD-10-CM | POA: Diagnosis not present

## 2016-02-11 DIAGNOSIS — Z8249 Family history of ischemic heart disease and other diseases of the circulatory system: Secondary | ICD-10-CM | POA: Diagnosis not present

## 2016-02-11 DIAGNOSIS — E876 Hypokalemia: Secondary | ICD-10-CM | POA: Diagnosis not present

## 2016-02-11 DIAGNOSIS — Z823 Family history of stroke: Secondary | ICD-10-CM | POA: Diagnosis not present

## 2016-02-11 DIAGNOSIS — Z87442 Personal history of urinary calculi: Secondary | ICD-10-CM | POA: Diagnosis not present

## 2016-02-11 LAB — URINALYSIS, ROUTINE W REFLEX MICROSCOPIC
BILIRUBIN URINE: NEGATIVE
GLUCOSE, UA: NEGATIVE mg/dL
KETONES UR: NEGATIVE mg/dL
NITRITE: NEGATIVE
PH: 5 (ref 5.0–8.0)
Protein, ur: 30 mg/dL — AB
Specific Gravity, Urine: 1.02 (ref 1.005–1.030)

## 2016-02-11 LAB — URINE MICROSCOPIC-ADD ON

## 2016-02-11 LAB — PROTIME-INR
INR: 1.21 (ref 0.00–1.49)
Prothrombin Time: 15.4 seconds — ABNORMAL HIGH (ref 11.6–15.2)

## 2016-02-11 LAB — TROPONIN I

## 2016-02-11 MED ORDER — LISINOPRIL 10 MG PO TABS
10.0000 mg | ORAL_TABLET | Freq: Every day | ORAL | Status: DC
  Administered 2016-02-11 – 2016-02-15 (×4): 10 mg via ORAL
  Filled 2016-02-11 (×4): qty 1

## 2016-02-11 MED ORDER — SODIUM CHLORIDE 0.9 % IV SOLN
3.0000 g | Freq: Once | INTRAVENOUS | Status: AC
  Administered 2016-02-11: 3 g via INTRAVENOUS

## 2016-02-11 MED ORDER — AMPICILLIN-SULBACTAM SODIUM 3 (2-1) G IJ SOLR
INTRAMUSCULAR | Status: AC
  Filled 2016-02-11: qty 3

## 2016-02-11 MED ORDER — ACETAMINOPHEN 325 MG PO TABS
650.0000 mg | ORAL_TABLET | Freq: Four times a day (QID) | ORAL | Status: DC | PRN
  Administered 2016-02-11: 650 mg via ORAL
  Filled 2016-02-11: qty 2

## 2016-02-11 MED ORDER — SODIUM CHLORIDE 0.9 % IV SOLN
Freq: Once | INTRAVENOUS | Status: AC
  Administered 2016-02-13 (×2): via INTRAVENOUS

## 2016-02-11 MED ORDER — ONDANSETRON HCL 4 MG/2ML IJ SOLN
4.0000 mg | Freq: Four times a day (QID) | INTRAMUSCULAR | Status: DC | PRN
  Administered 2016-02-11 – 2016-02-12 (×3): 4 mg via INTRAVENOUS
  Filled 2016-02-11 (×4): qty 2

## 2016-02-11 MED ORDER — ONDANSETRON HCL 4 MG PO TABS: 4.0000 mg | ORAL_TABLET | Freq: Four times a day (QID) | ORAL | Status: DC | PRN

## 2016-02-11 MED ORDER — HEPARIN SODIUM (PORCINE) 5000 UNIT/ML IJ SOLN
5000.0000 [IU] | Freq: Three times a day (TID) | INTRAMUSCULAR | Status: DC
Start: 2016-02-11 — End: 2016-02-12
  Administered 2016-02-11 – 2016-02-12 (×3): 5000 [IU] via SUBCUTANEOUS
  Filled 2016-02-11 (×3): qty 1

## 2016-02-11 MED ORDER — DEXTROSE-NACL 5-0.9 % IV SOLN
INTRAVENOUS | Status: AC
  Administered 2016-02-11 – 2016-02-12 (×2): via INTRAVENOUS

## 2016-02-11 MED ORDER — PANTOPRAZOLE SODIUM 40 MG PO TBEC
40.0000 mg | DELAYED_RELEASE_TABLET | Freq: Every day | ORAL | Status: DC
  Administered 2016-02-11 – 2016-02-15 (×4): 40 mg via ORAL
  Filled 2016-02-11 (×4): qty 1

## 2016-02-11 MED ORDER — FENTANYL CITRATE (PF) 100 MCG/2ML IJ SOLN
25.0000 ug | INTRAMUSCULAR | Status: DC | PRN
  Administered 2016-02-11 – 2016-02-12 (×3): 25 ug via INTRAVENOUS
  Filled 2016-02-11 (×3): qty 2

## 2016-02-11 MED ORDER — DEXTROSE-NACL 5-0.9 % IV SOLN
INTRAVENOUS | Status: DC
  Administered 2016-02-11: 03:00:00 via INTRAVENOUS

## 2016-02-11 MED ORDER — SODIUM CHLORIDE 0.9 % IV SOLN
1.5000 g | Freq: Three times a day (TID) | INTRAVENOUS | Status: DC
  Administered 2016-02-11 – 2016-02-12 (×5): 1.5 g via INTRAVENOUS
  Administered 2016-02-13: 18:00:00 via INTRAVENOUS
  Administered 2016-02-13 – 2016-02-15 (×5): 1.5 g via INTRAVENOUS
  Filled 2016-02-11 (×16): qty 1.5

## 2016-02-11 MED ORDER — ALPRAZOLAM 0.25 MG PO TABS
0.2500 mg | ORAL_TABLET | Freq: Two times a day (BID) | ORAL | Status: DC | PRN
  Administered 2016-02-11: 0.25 mg via ORAL
  Filled 2016-02-11: qty 1

## 2016-02-11 NOTE — H&P (Signed)
History and Physical    DAYTONA STANFORTH [id number] DOB: [birth year] DOA: 02/10/2016  Referring MD/NP/PA: Rolland Porter, MD PCP: Chevis Pretty, FNP  Outpatient Specialists: none Patient coming from: Home  Chief Complaint: Abdominal Pain  HPI: Christie Bruce is a 75 y.o. female with medical history significant of GERD, Essential Hypertension, GAD, CKD stage 3, Anemia, and breast cancer, presents to the ED with complaints of bilateral lower abdominal pain that radiated up to her chest into her right flank that onset around 1:30am this morning. She was evaluated Friday evening in an urgent care clinic and was noted to have a temperature of 99 and was advised to come to the ED. She reports that her pain is intermittent and last for about 20 to 30 minutes each episode. She reported that the pain is similar to when she had kidney stones 15 years ago.  She reports associated nausea and has been vomiting 3 to 4 times a day for the past 2 days. Reports mild bloating and burping. She also noted a decrease in appetite 2 days ago as well. She denies diarrhea, dysuria, coughing, urinary frequency, and constipation. Pt reports falling often, Denies alcohol use.  Denies DM or previous heart problems.  ED Course: What in the ED, Troponin <0.03, BUN 39, Creatinine 1.28, Glucose 172, WBC 14.6, Hgb 9.3, HCT 29.4, UA unremarkable. CT abdomen/pelvis shows acute cholecystitis. CXR unremarkable. EKG sinus rhythm. Hospitalist was asked to refer for admission for treatment of acute cholecystitis.   Review of Systems: As per HPI otherwise 10 point review of systems negative.   Past Medical History  Diagnosis Date  . Breast cancer ([place])   . Hypertension   . GERD (gastroesophageal reflux disease)   . Uterine prolapse   . 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 GERD (gastroesophageal reflux disease) 09/12/2011  . Heart murmur   . Chronic kidney disease   . PONV (postoperative nausea and vomiting)      Past Surgical History  Procedure Laterality Date  . Bilateral mastectomy  07/19/10    bilat mastectomy for DCIS  . Knee surgery Left   . Wrist surgery Left   . Intramedullary (im) nail intertrochanteric Left 12/28/2015    Procedure: INTRAMEDULLARY (IM) NAIL LEFT HIP;  Surgeon: Renette Butters, MD;  Location: [facility];  Service: Orthopedics;  Laterality: Left;     reports that she has quit smoking. Her smoking use included Cigarettes. She quit after 15 years of use. She has never used smokeless tobacco. She reports that she does not drink alcohol or use illicit drugs.  Allergies  Allergen Reactions  . Celebrex [Celecoxib]   . Sulfa Antibiotics     Had sores in my mouth, bottom of my feet "the skin just came right off"    Family History  Problem Relation Age of Onset  . Cancer Mother     melanoma  . Stroke Mother   . Cancer Father     prostate cancer  . Heart disease Father   . Hypertension Father   . Cancer Brother     lung cancer  . Cancer Paternal Uncle     lung cancer  . Hypertension Sister   . Heart attack Brother   . Diabetes Brother   . Diabetes Brother    Prior to Admission medications   Medication Sig Start Date End Date Taking? Authorizing Provider  acetaminophen (TYLENOL) 325 MG tablet Take 650 mg by mouth every 6 (six) hours as needed  for mild pain or moderate pain.   Yes Historical Provider, MD  ALPRAZolam (XANAX) 0.25 MG tablet Take 1 tablet (0.25 mg total) by mouth 2 (two) times daily as needed for anxiety. 12/30/15  Yes Clanford Marisa Hua, MD  cetirizine (ZYRTEC) 10 MG tablet Take 1 tablet (10 mg total) by mouth daily as needed for allergies or rhinitis. 12/30/15  Yes Clanford Marisa Hua, MD  chlorthalidone (HYGROTON) 25 MG tablet Take 1 tablet (25 mg total) by mouth daily. Patient taking differently: Take 25 mg by mouth daily as needed (for fluid).  01/26/16  Yes Wardell Honour, MD  fluticasone (FLONASE) 50 MCG/ACT nasal spray Place 2 sprays into both nostrils  daily. 12/08/15  Yes Sharion Balloon, FNP  HYDROcodone-acetaminophen (NORCO/VICODIN) 5-325 MG tablet Take 1 tablet by mouth 2 (two) times daily as needed for moderate pain.   Yes Historical Provider, MD  lisinopril (PRINIVIL,ZESTRIL) 10 MG tablet Take 1 tablet (10 mg total) by mouth daily. 02/07/16  Yes Timmothy Euler, MD  metroNIDAZOLE (FLAGYL) 500 MG tablet Take 1 tablet (500 mg total) by mouth 3 (three) times daily. 02/08/16  Yes Florian Buff, MD  omeprazole (PRILOSEC) 40 MG capsule Take 1 capsule (40 mg total) by mouth daily. 12/30/15  Yes Clanford Marisa Hua, MD  traMADol (ULTRAM) 50 MG tablet Take 0.5-1 tablets (25-50 mg total) by mouth every 8 (eight) hours as needed. 02/07/16  Yes Timmothy Euler, MD  docusate sodium (COLACE) 100 MG capsule Take 1 capsule (100 mg total) by mouth 2 (two) times daily. Patient not taking: Reported on 02/10/2016 12/30/15   Murlean Iba, MD   Physical Exam: Filed Vitals:   02/10/16 2118 02/11/16 0012  BP: 137/58 130/55  Pulse: 92 91  Temp: 99.4 F (37.4 C)   TempSrc: Oral   Resp: 20 16  Height: 5\' 2"  (1.575 m)   Weight: 64.411 kg (142 lb)   SpO2: 96% 93%   Constitutional: NAD, calm, comfortable Filed Vitals:   02/10/16 2118 02/11/16 0012  BP: 137/58 130/55  Pulse: 92 91  Temp: 99.4 F (37.4 C)   TempSrc: Oral   Resp: 20 16  Height: 5\' 2"  (1.575 m)   Weight: 64.411 kg (142 lb)   SpO2: 96% 93%   Eyes: PERRL, lids and conjunctivae normal ENMT: Mucous membranes are moist. Posterior pharynx clear of any exudate or lesions.Normal dentition.  Neck: normal, supple, no masses, no thyromegaly Respiratory: clear to auscultation bilaterally, no wheezing, no crackles. Normal respiratory effort. No accessory muscle use.  Cardiovascular: Regular rate and rhythm, no murmurs / rubs / gallops. No extremity edema. 2+ pedal pulses. No carotid bruits.  Abdomen: RUQ pain, no masses palpated. No hepatosplenomegaly. Bowel sounds positive.  Musculoskeletal: no  clubbing / cyanosis. No joint deformity upper and lower extremities. Good ROM, no contractures. Normal muscle tone.  Skin: no rashes, lesions, ulcers. No induration Neurologic: CN 2-12 grossly intact. Sensation intact, DTR normal. Strength 5/5 in all 4.  Psychiatric: Normal judgment and insight. Alert and oriented x 3. Normal mood.   Labs on Admission: I have personally reviewed following labs and imaging studies  CBC:  Recent Labs Lab 02/07/16 1643 02/10/16 2206  WBC 7.1 14.6*  NEUTROABS 3.7  --   HGB  --  9.3*  HCT 28.6* 29.4*  MCV 90 89.4  PLT 298 [id number]   Basic Metabolic Panel:  Recent Labs Lab 02/07/16 1643 02/10/16 2206  NA 141 137  K 4.4 4.1  CL 98  103  CO2 22 22  GLUCOSE 94 172*  BUN 37* 39*  CREATININE 1.52* 1.28*  CALCIUM 9.1 9.3   Liver Function Tests:  Recent Labs Lab 02/07/16 1643 02/10/16 2206  AST 28 26  ALT 14 14  ALKPHOS 145* 127*  BILITOT 0.7 0.9  PROT 7.2 8.2*  ALBUMIN 3.8 3.7    Recent Labs Lab 02/10/16 2206  LIPASE 17   Cardiac Enzymes:  Recent Labs Lab 02/10/16 2206  TROPONINI <0.03   Urine analysis:    Component Value Date/Time   COLORURINE YELLOW 02/11/2016 0004   APPEARANCEUR CLEAR 02/11/2016 0004   LABSPEC 1.020 02/11/2016 0004   PHURINE 5.0 02/11/2016 0004   GLUCOSEU NEGATIVE 02/11/2016 0004   HGBUR SMALL* 02/11/2016 0004   BILIRUBINUR NEGATIVE 02/11/2016 0004   KETONESUR NEGATIVE 02/11/2016 0004   PROTEINUR 30* 02/11/2016 0004   NITRITE NEGATIVE 02/11/2016 0004   LEUKOCYTESUR MODERATE* 02/11/2016 0004   Radiological Exams on Admission: Dg Chest 2 View  02/10/2016  CLINICAL DATA:  Crackles.  Nausea, vomiting, and abdominal pain. EXAM: CHEST  2 VIEW COMPARISON:  12/27/2015 chest/rib radiographs. 05/13/2015 chest radiographs. FINDINGS: The cardiomediastinal silhouette is within normal limits. The patient has taken a shallower inspiration than on the prior study. Chronically increased interstitial markings throughout  both lungs are unchanged. A wheelchair partially obscures the posterior chest on the lateral radiograph. Within this limitation, no airspace consolidation, edema, pleural effusion, or pneumothorax is identified. Lower thoracic and upper lumbar compression fractures are unchanged. A calcified gallstone is again seen. IMPRESSION: No active cardiopulmonary disease. Electronically Signed   By: Logan Bores M.D.   On: 02/10/2016 21:50   Ct Renal Stone Study  02/11/2016  CLINICAL DATA:  Right flank and upper quadrant pain. EXAM: CT ABDOMEN AND PELVIS WITHOUT CONTRAST TECHNIQUE: Multidetector CT imaging of the abdomen and pelvis was performed following the standard protocol without IV contrast. COMPARISON:  None. FINDINGS: Lower chest and abdominal wall:  No contributory findings. Hepatobiliary: No focal liver abnormality.Over distended gallbladder with large stone in the neck. Extensive fat edema around the gallbladder fossa. Normal common bile duct diameter. Pancreas: Fatty atrophy of the pancreas. Spleen: Tiny incidental low densities in the central spleen. Adrenals/Urinary Tract: Negative adrenals. Bilateral renal atrophy with punctate lower pole stone on the right. No hydronephrosis or ureteral calculus. Unremarkable bladder. Stomach/Bowel:  No obstruction. No appendicitis. Reproductive:Hysterectomy.  Negative adnexae.  Pessary in place. Vascular/Lymphatic: No acute vascular abnormality. Mild fat haziness in the small bowel mesentery, likely incidental mesenteric panniculitis in this setting. Other: Trace ascites considered reactive. Musculoskeletal: Remote posterior left eighth and ninth rib fractures that are chronic but without completed healing. Intertrochanteric left femur fracture repair without acute finding. Remote T10, T12, L2, and L4 superior endplate fractures. IMPRESSION: Acute cholecystitis. Electronically Signed   By: Monte Fantasia M.D.   On: 02/11/2016 00:40    EKG: Independently reviewed.  SR  Assessment/Plan Principal Problem:   Acute cholecystitis Active Problems:   GERD (gastroesophageal reflux disease)   Essential hypertension   CKD (chronic kidney disease), stage III  1. Acute Cholecystitis. CT abd pelvis showed acute cholecystitis. General surgery has been consulted and will defer the timing of CCY to surgery.  In the interim, will Tx with Unasyn and made NPO.  2. GERD. Continue PPI 3. Essential HTN. Stable, continue outpatient regimen. 4. CKD stage 3. Cr currently 1.28, appears to be around baseline. Avoid nephrotoxic drugs.  5. Anemia of chronic disease, likely related to renal insufficiency. Appears at  baseline. 6. GAD. Appears at baseline.   DVT prophylaxis: Heparin Code Status: Full Family Communication: No family present. Discussed with patient. Disposition Plan: admit, anticipate discharge home once improved, likely 2-3 days. Consults called: General surgery Admission status: admit as inpatient.  Orvan Falconer, MD FACP [facility]    If 7PM-7AM, please contact night-coverage [email or web address] Password [email or web address]  02/11/2016, 1:54 AM  By signing my name below, I, Hilbert Odor, attest that this documentation has been prepared under the direction and in the presence of Orvan Falconer, MD. Electronically Signed: Hilbert Odor, [place] 02/11/2016, 1:56 AM

## 2016-02-11 NOTE — Progress Notes (Signed)
Pharmacy Antibiotic Note  Christie Bruce is a 75 y.o. female admitted on 02/10/2016 with intra abdominal infection.  Pharmacy has been consulted for unasyn dosing. Patient received 3 gm unasyn X 1 and 1.5 gm q8.  Plan: Continue unsyn 1.5 gm IV q8 hours F/u renal function, cultures and clinical course  Height: 5\' 2"  (157.5 cm) Weight: 143 lb 3.2 oz (64.955 kg) IBW/kg (Calculated) : 50.1  Temp (24hrs), Avg:99.3 F (37.4 C), Min:99.1 F (37.3 C), Max:99.4 F (37.4 C)   Recent Labs Lab 02/07/16 1643 02/10/16 2206  WBC 7.1 14.6*  CREATININE 1.52* 1.28*    Estimated Creatinine Clearance: 33.6 mL/min (by C-G formula based on Cr of 1.28).    Allergies  Allergen Reactions  . Celebrex [Celecoxib]   . Sulfa Antibiotics     Had sores in my mouth, bottom of my feet "the skin just came right off"    Antimicrobials this admission: unasyn  7/22 >>    Thank you for allowing pharmacy to be a part of this patient's care.  Excell Seltzer [place] 02/11/2016 9:27 AM

## 2016-02-11 NOTE — Progress Notes (Signed)
PROGRESS NOTE                                                                                                                                                                                                             Patient Demographics:    Christie Bruce, is a 75 y.o. female, DOB - [birth year], [id number]  Admit date - 02/10/2016   Admitting Physician Orvan Falconer, MD  Outpatient Primary MD for the patient is Chevis Pretty, FNP  LOS - 0  Chief Complaint  Patient presents with  . Abdominal Pain       Brief Narrative    Christie Bruce is a 75 y.o. female with medical history significant of GERD, Essential Hypertension, GAD, CKD stage 3, Anemia, and breast cancer, presents to the ED with complaints of bilateral lower abdominal pain that radiated up to her chest into her right flank that onset around 1:30am this morning. She was evaluated Friday evening in an urgent care clinic and was noted to have a temperature of 99 and was advised to come to the ED. She reports that her pain is intermittent and last for about 20 to 30 minutes each episode. She reported that the pain is similar to when she had kidney stones 15 years ago. She reports associated nausea and has been vomiting 3 to 4 times a day for the past 2 days. Reports mild bloating and burping. She also noted a decrease in appetite 2 days ago as well. She denies diarrhea, dysuria, coughing, urinary frequency, and constipation. Pt reports falling often, Denies alcohol use. Denies DM or previous heart problems.  ED Course: What in the ED, Troponin <0.03, BUN 39, Creatinine 1.28, Glucose 172, WBC 14.6, Hgb 9.3, HCT 29.4, UA unremarkable. CT abdomen/pelvis shows acute cholecystitis. CXR unremarkable. EKG sinus rhythm. Hospitalist was asked to refer for admission for treatment of acute cholecystitis.    Subjective:    Christie Bruce today has, No headache, No chest pain, +ve RUQ  abdominal pain - No Nausea, No new weakness tingling or numbness, No Cough - SOB.     Assessment  & Plan :     1.Acute cholecystitis. Seen by general surgery, currently on clear liquids and Unasyn, per general surgery and tinea supportive care with IV fluids and pain control, possibly OR tomorrow. Surgery also wants to obtain a  right upper quadrant ultrasound. Stable baseline EKG and two-view chest x-ray.  2. GERD. On PPI.  3. Essential hypertension. Low-dose ACE inhibitor to continue.  4. Recent left intertrochanteric fracture. 6 Weeks ago. Status postsurgical repair. Weightbearing as tolerated, initiate PT.    Family Communication  :  None present  Code Status :  Full  Diet : Clear liquids per surgery  Disposition Plan  : Remain inpatient  Consults  :  Gen. surgery  Procedures  :   CT abd Pelvis - Acute cholecystitis.  Right upper quadrant ultrasound ordered  DVT Prophylaxis  :   Heparin   Lab Results  Component Value Date   PLT 377 02/10/2016    Inpatient Medications  Scheduled Meds: . sodium chloride   Intravenous Once  . ampicillin-sulbactam (UNASYN) IV  1.5 g Intravenous Q8H  . heparin  5,000 Units Subcutaneous Q8H  . lisinopril  10 mg Oral Daily  . pantoprazole  40 mg Oral Daily   Continuous Infusions: . dextrose 5 % and 0.9% NaCl     PRN Meds:.acetaminophen, ALPRAZolam, fentaNYL (SUBLIMAZE) injection, ondansetron **OR** ondansetron (ZOFRAN) IV  Antibiotics  :    Anti-infectives    Start     Dose/Rate Route Frequency Ordered Stop   02/11/16 0930  ampicillin-sulbactam (UNASYN) 1.5 g in sodium chloride 0.9 % 50 mL IVPB     1.5 g 100 mL/hr over 30 Minutes Intravenous Every 8 hours 02/11/16 0257     02/11/16 0115  Ampicillin-Sulbactam (UNASYN) 3 g in sodium chloride 0.9 % 100 mL IVPB     3 g 100 mL/hr over 60 Minutes Intravenous  Once 02/11/16 0101 02/11/16 0221         Objective:   Filed Vitals:   02/11/16 0012 02/11/16 0230 02/11/16 0244  02/11/16 0318  BP: 130/55 132/60  132/56  Pulse: 91 84  87  Temp:    99.1 F (37.3 C)  TempSrc:    Oral  Resp: 16 19  20   Height:      Weight:    64.955 kg (143 lb 3.2 oz)  SpO2: 93% 91% 94% 100%    Wt Readings from Last 3 Encounters:  02/11/16 64.955 kg (143 lb 3.2 oz)  02/08/16 64.819 kg (142 lb 14.4 oz)  02/07/16 64.683 kg (142 lb 9.6 oz)     Intake/Output Summary (Last 24 hours) at 02/11/16 0903 Last data filed at 02/11/16 0221  Gross per 24 hour  Intake   1000 ml  Output      0 ml  Net   1000 ml     Physical Exam  Awake Alert, Oriented X 3, No new F.N deficits, Normal affect [place].AT,PERRAL Supple Neck,No JVD, No cervical lymphadenopathy appriciated.  Symmetrical Chest wall movement, Good air movement bilaterally, CTAB RRR,No Gallops,Rubs or new Murmurs, No Parasternal Heave +ve B.Sounds, Abd Soft, +ve RUQ tenderness, No organomegaly appriciated, No rebound - guarding or rigidity. No Cyanosis, Clubbing or edema, No new Rash or bruise      Data Review:    CBC  Recent Labs Lab 02/07/16 1643 02/10/16 2206  WBC 7.1 14.6*  HGB  --  9.3*  HCT 28.6* 29.4*  PLT 298 377  MCV 90 89.4  MCH 27.9 28.3  MCHC 31.1* 31.6  RDW 14.8 14.5  LYMPHSABS 2.5  --   EOSABS 0.2  --   BASOSABS 0.0  --     Chemistries   Recent Labs Lab 02/07/16 1643 02/10/16 2206  NA 141 137  K 4.4 4.1  CL 98 103  CO2 22 22  GLUCOSE 94 172*  BUN 37* 39*  CREATININE 1.52* 1.28*  CALCIUM 9.1 9.3  AST 28 26  ALT 14 14  ALKPHOS 145* 127*  BILITOT 0.7 0.9   ------------------------------------------------------------------------------------------------------------------ No results for input(s): CHOL, HDL, LDLCALC, TRIG, CHOLHDL, LDLDIRECT in the last 72 hours.  No results found for: HGBA1C ------------------------------------------------------------------------------------------------------------------ No results for input(s): TSH, T4TOTAL, T3FREE, THYROIDAB in the last 72  hours.  Invalid input(s): FREET3 ------------------------------------------------------------------------------------------------------------------ No results for input(s): VITAMINB12, FOLATE, FERRITIN, TIBC, IRON, RETICCTPCT in the last 72 hours.  Coagulation profile No results for input(s): INR, PROTIME in the last 168 hours.  No results for input(s): DDIMER in the last 72 hours.  Cardiac Enzymes  Recent Labs Lab 02/10/16 2206  TROPONINI <0.03   ------------------------------------------------------------------------------------------------------------------ No results found for: BNP  Micro Results No results found for this or any previous visit (from the past 240 hour(s)).  Radiology Reports Dg Chest 2 View  02/10/2016  CLINICAL DATA:  Crackles.  Nausea, vomiting, and abdominal pain. EXAM: CHEST  2 VIEW COMPARISON:  12/27/2015 chest/rib radiographs. 05/13/2015 chest radiographs. FINDINGS: The cardiomediastinal silhouette is within normal limits. The patient has taken a shallower inspiration than on the prior study. Chronically increased interstitial markings throughout both lungs are unchanged. A wheelchair partially obscures the posterior chest on the lateral radiograph. Within this limitation, no airspace consolidation, edema, pleural effusion, or pneumothorax is identified. Lower thoracic and upper lumbar compression fractures are unchanged. A calcified gallstone is again seen. IMPRESSION: No active cardiopulmonary disease. Electronically Signed   By: Logan Bores M.D.   On: 02/10/2016 21:50   Ct Renal Stone Study  02/11/2016  CLINICAL DATA:  Right flank and upper quadrant pain. EXAM: CT ABDOMEN AND PELVIS WITHOUT CONTRAST TECHNIQUE: Multidetector CT imaging of the abdomen and pelvis was performed following the standard protocol without IV contrast. COMPARISON:  None. FINDINGS: Lower chest and abdominal wall:  No contributory findings. Hepatobiliary: No focal liver abnormality.Over  distended gallbladder with large stone in the neck. Extensive fat edema around the gallbladder fossa. Normal common bile duct diameter. Pancreas: Fatty atrophy of the pancreas. Spleen: Tiny incidental low densities in the central spleen. Adrenals/Urinary Tract: Negative adrenals. Bilateral renal atrophy with punctate lower pole stone on the right. No hydronephrosis or ureteral calculus. Unremarkable bladder. Stomach/Bowel:  No obstruction. No appendicitis. Reproductive:Hysterectomy.  Negative adnexae.  Pessary in place. Vascular/Lymphatic: No acute vascular abnormality. Mild fat haziness in the small bowel mesentery, likely incidental mesenteric panniculitis in this setting. Other: Trace ascites considered reactive. Musculoskeletal: Remote posterior left eighth and ninth rib fractures that are chronic but without completed healing. Intertrochanteric left femur fracture repair without acute finding. Remote T10, T12, L2, and L4 superior endplate fractures. IMPRESSION: Acute cholecystitis. Electronically Signed   By: Monte Fantasia M.D.   On: 02/11/2016 00:40    Time Spent in minutes  30   SINGH,PRASHANT K M.D on 02/11/2016 at 9:03 AM  Between 7am to 7pm - Pager - [phone]  After 7pm go to [email or web address] - password [facility]  [facility] -  Office  [phone]

## 2016-02-11 NOTE — ED Provider Notes (Signed)
CSN: [id number]     Arrival date & time 02/10/16  2114 History   First MD Initiated Contact with Patient 02/10/16 23:27 PM     Chief Complaint  Patient presents with  . Abdominal Pain     (Consider location/radiation/quality/duration/timing/severity/associated sxs/prior Treatment) HPI patient 20 mg a day to 10 mg a day. states about 1:30 this morning she started having pain in her lower abdomen bilaterally that spread up her abdomen to her anterior chest and then ended up in her right flank. She states the pain will move around and it's not all in the same place at the same time. The pain comes and goes and lasts about 20-30 minutes. She can only describe the pain as "grabs you". She has nausea and has had vomiting 3-4 times a day area and she has had decreased appetite for the past 2 days. She's had some mild bloating and some mild burping. She denies any diarrhea. She denies dysuria, coughing, urinary frequency, constipation. Her urine has been noted to be dark. She states she had similar pain when she had kidney stones about 15 years ago. She went to the urgent care this evening and she was noted to have a temperature of 99 and she was advised to come to the ED. Recently she's had her lisinopril changed from 20 mg a day to 10 mg a day. Her son reports she coughs in her sleep however she's not aware of coughing. Her daughter states they have put her on Zyrtec for allergies.    PCP [facility] in [place]   Past Medical History  Diagnosis Date  . Breast cancer ([place])   . Hypertension   . GERD (gastroesophageal reflux disease)   . Uterine prolapse   . 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 GERD (gastroesophageal reflux disease) 09/12/2011  . Heart murmur   . Chronic kidney disease   . PONV (postoperative nausea and vomiting)    Past Surgical History  Procedure Laterality Date  . Bilateral mastectomy  07/19/10    bilat mastectomy for DCIS  . Knee surgery Left    . Wrist surgery Left   . Intramedullary (im) nail intertrochanteric Left 12/28/2015    Procedure: INTRAMEDULLARY (IM) NAIL LEFT HIP;  Surgeon: Renette Butters, MD;  Location: [place];  Service: Orthopedics;  Laterality: Left;   Family History  Problem Relation Age of Onset  . Cancer Mother     melanoma  . Stroke Mother   . Cancer Father     prostate cancer  . Heart disease Father   . Hypertension Father   . Cancer Brother     lung cancer  . Cancer Paternal Uncle     lung cancer  . Hypertension Sister   . Heart attack Brother   . Diabetes Brother   . Diabetes Brother    Social History  Substance Use Topics  . Smoking status: Former Smoker -- 15 years    Types: Cigarettes  . Smokeless tobacco: Never Used  . Alcohol Use: No   Son lives with her  OB History    Gravida Para Term Preterm AB TAB SAB Ectopic Multiple Living   3 3        3      Review of Systems  All other systems reviewed and are negative.     Allergies  Celebrex and Sulfa antibiotics  Home Medications   Prior to Admission medications   Medication Sig Start Date End Date  Taking? Authorizing Provider  acetaminophen (TYLENOL) 325 MG tablet Take 650 mg by mouth every 6 (six) hours as needed for mild pain or moderate pain.   Yes Historical Provider, MD  ALPRAZolam (XANAX) 0.25 MG tablet Take 1 tablet (0.25 mg total) by mouth 2 (two) times daily as needed for anxiety. 12/30/15  Yes Clanford Marisa Hua, MD  cetirizine (ZYRTEC) 10 MG tablet Take 1 tablet (10 mg total) by mouth daily as needed for allergies or rhinitis. 12/30/15  Yes Clanford Marisa Hua, MD  chlorthalidone (HYGROTON) 25 MG tablet Take 1 tablet (25 mg total) by mouth daily. Patient taking differently: Take 25 mg by mouth daily as needed (for fluid).  01/26/16  Yes Wardell Honour, MD  fluticasone (FLONASE) 50 MCG/ACT nasal spray Place 2 sprays into both nostrils daily. 12/08/15  Yes Sharion Balloon, FNP  HYDROcodone-acetaminophen (NORCO/VICODIN) 5-325 MG  tablet Take 1 tablet by mouth 2 (two) times daily as needed for moderate pain.   Yes Historical Provider, MD  lisinopril (PRINIVIL,ZESTRIL) 10 MG tablet Take 1 tablet (10 mg total) by mouth daily. 02/07/16  Yes Timmothy Euler, MD  metroNIDAZOLE (FLAGYL) 500 MG tablet Take 1 tablet (500 mg total) by mouth 3 (three) times daily. 02/08/16  Yes Florian Buff, MD  omeprazole (PRILOSEC) 40 MG capsule Take 1 capsule (40 mg total) by mouth daily. 12/30/15  Yes Clanford Marisa Hua, MD  traMADol (ULTRAM) 50 MG tablet Take 0.5-1 tablets (25-50 mg total) by mouth every 8 (eight) hours as needed. 02/07/16  Yes Timmothy Euler, MD  docusate sodium (COLACE) 100 MG capsule Take 1 capsule (100 mg total) by mouth 2 (two) times daily. Patient not taking: Reported on 02/10/2016 12/30/15   Clanford L Johnson, MD   BP 137/58 mmHg  Pulse 92  Temp(Src) 99.4 F (37.4 C) (Oral)  Resp 20  Ht 5\' 2"  (1.575 m)  Wt 142 lb (64.411 kg)  BMI 25.97 kg/m2  SpO2 96%  Vital signs normal   Physical Exam  Constitutional: She is oriented to person, place, and time. She appears well-developed and well-nourished.  Non-toxic appearance. She does not appear ill. No distress.  HENT:  Head: Normocephalic and atraumatic.  Right Ear: External ear normal.  Left Ear: External ear normal.  Nose: Nose normal. No mucosal edema or rhinorrhea.  Mouth/Throat: Mucous membranes are normal. No dental abscesses or uvula swelling.  Dry mucous membranes, missing several teeth  Eyes: Conjunctivae and EOM are normal. Pupils are equal, round, and reactive to light.  Neck: Normal range of motion and full passive range of motion without pain. Neck supple.  Cardiovascular: Normal rate, regular rhythm and normal heart sounds.  Exam reveals no gallop and no friction rub.   No murmur heard. Pulmonary/Chest: Effort normal and breath sounds normal. No respiratory distress. She has no wheezes. She has no rhonchi. She has no rales. She exhibits no tenderness and  no crepitus.  Abdominal: Soft. Normal appearance and bowel sounds are normal. She exhibits no distension. There is tenderness in the right upper quadrant. There is no rebound and no guarding.  No CVA tenderness although she points to her right flank as where her pain radiates  Musculoskeletal: Normal range of motion. She exhibits no edema or tenderness.  Moves all extremities well.   Neurological: She is alert and oriented to person, place, and time. She has normal strength. No cranial nerve deficit.  Skin: Skin is warm, dry and intact. No rash noted. No erythema. There  is pallor.  Psychiatric: She has a normal mood and affect. Her speech is normal and behavior is normal. Her mood appears not anxious.  Nursing note and vitals reviewed.   ED Course  Procedures (including critical care time)  Medications  Ampicillin-Sulbactam (UNASYN) 3 g in sodium chloride 0.9 % 100 mL IVPB (3 g Intravenous New Bag/Given 02/11/16 0113)  sodium chloride 0.9 % bolus 1,000 mL (1,000 mLs Intravenous New Bag/Given 02/10/16 2343)  fentaNYL (SUBLIMAZE) injection 25 mcg (25 mcg Intravenous Given 02/10/16 2345)  ondansetron (ZOFRAN) injection 4 mg (4 mg Intravenous Given 02/10/16 2344)   Patient appeared to be dehydrated. She was given IV fluids. She also was given IV pain and nausea medication.A CT renal was ordered due to her stating this pain was similar to when she had a kidney stone in the past.  Patient was given the results of her CT scan. She was started on Unasyn for her acute cholecystitis. We discussed the need for admission and probable cholecystectomy once she is improved.  01:37 AM Dr Arnoldo Morale, surgery, have medicine admit  01:53 AM Dr Marin Comment, hospitalist, will see and admit  Labs Review Results for orders placed or performed during the hospital encounter of 02/10/16  Lipase, blood  Result Value Ref Range   Lipase 17 11 - 51 U/L  Comprehensive metabolic panel  Result Value Ref Range   Sodium 137 135 -  145 mmol/L   Potassium 4.1 3.5 - 5.1 mmol/L   Chloride 103 101 - 111 mmol/L   CO2 22 22 - 32 mmol/L   Glucose, Bld 172 (H) 65 - 99 mg/dL   BUN 39 (H) 6 - 20 mg/dL   Creatinine, Ser 1.28 (H) 0.44 - 1.00 mg/dL   Calcium 9.3 8.9 - 10.3 mg/dL   Total Protein 8.2 (H) 6.5 - 8.1 g/dL   Albumin 3.7 3.5 - 5.0 g/dL   AST 26 15 - 41 U/L   ALT 14 14 - 54 U/L   Alkaline Phosphatase 127 (H) 38 - 126 U/L   Total Bilirubin 0.9 0.3 - 1.2 mg/dL   GFR calc non Af Amer 40 (L) >60 mL/min   GFR calc Af Amer 46 (L) >60 mL/min   Anion gap 12 5 - 15  CBC  Result Value Ref Range   WBC 14.6 (H) 4.0 - 10.5 K/uL   RBC 3.29 (L) 3.87 - 5.11 MIL/uL   Hemoglobin 9.3 (L) 12.0 - 15.0 g/dL   HCT 29.4 (L) 36.0 - 46.0 %   MCV 89.4 78.0 - 100.0 fL   MCH 28.3 26.0 - 34.0 pg   MCHC 31.6 30.0 - 36.0 g/dL   RDW 14.5 11.5 - 15.5 %   Platelets 377 150 - 400 K/uL  Urinalysis, Routine w reflex microscopic  Result Value Ref Range   Color, Urine YELLOW YELLOW   APPearance CLEAR CLEAR   Specific Gravity, Urine 1.020 1.005 - 1.030   pH 5.0 5.0 - 8.0   Glucose, UA NEGATIVE NEGATIVE mg/dL   Hgb urine dipstick SMALL (A) NEGATIVE   Bilirubin Urine NEGATIVE NEGATIVE   Ketones, ur NEGATIVE NEGATIVE mg/dL   Protein, ur 30 (A) NEGATIVE mg/dL   Nitrite NEGATIVE NEGATIVE   Leukocytes, UA MODERATE (A) NEGATIVE  Troponin I  Result Value Ref Range   Troponin I <0.03 <0.03 ng/mL  Urine microscopic-add on  Result Value Ref Range   Squamous Epithelial / LPF 0-5 (A) NONE SEEN   WBC, UA 0-5 0 - 5 WBC/hpf  RBC / HPF 0-5 0 - 5 RBC/hpf   Bacteria, UA RARE (A) NONE SEEN   Laboratory interpretation all normal except leukocytosis, stable anemia, stable renal insufficiency   Imaging Review Dg Chest 2 View  02/10/2016  CLINICAL DATA:  Crackles.  Nausea, vomiting, and abdominal pain. EXAM: CHEST  2 VIEW COMPARISON:  12/27/2015 chest/rib radiographs. 05/13/2015 chest radiographs. FINDINGS: The cardiomediastinal silhouette is within  normal limits. The patient has taken a shallower inspiration than on the prior study. Chronically increased interstitial markings throughout both lungs are unchanged. A wheelchair partially obscures the posterior chest on the lateral radiograph. Within this limitation, no airspace consolidation, edema, pleural effusion, or pneumothorax is identified. Lower thoracic and upper lumbar compression fractures are unchanged. A calcified gallstone is again seen. IMPRESSION: No active cardiopulmonary disease. Electronically Signed   By: Logan Bores M.D.   On: 02/10/2016 21:50   Ct Renal Stone Study  02/11/2016  CLINICAL DATA:  Right flank and upper quadrant pain. EXAM: CT ABDOMEN AND PELVIS WITHOUT CONTRAST TECHNIQUE: Multidetector CT imaging of the abdomen and pelvis was performed following the standard protocol without IV contrast. COMPARISON:  None. FINDINGS: Lower chest and abdominal wall:  No contributory findings. Hepatobiliary: No focal liver abnormality.Over distended gallbladder with large stone in the neck. Extensive fat edema around the gallbladder fossa. Normal common bile duct diameter. Pancreas: Fatty atrophy of the pancreas. Spleen: Tiny incidental low densities in the central spleen. Adrenals/Urinary Tract: Negative adrenals. Bilateral renal atrophy with punctate lower pole stone on the right. No hydronephrosis or ureteral calculus. Unremarkable bladder. Stomach/Bowel:  No obstruction. No appendicitis. Reproductive:Hysterectomy.  Negative adnexae.  Pessary in place. Vascular/Lymphatic: No acute vascular abnormality. Mild fat haziness in the small bowel mesentery, likely incidental mesenteric panniculitis in this setting. Other: Trace ascites considered reactive. Musculoskeletal: Remote posterior left eighth and ninth rib fractures that are chronic but without completed healing. Intertrochanteric left femur fracture repair without acute finding. Remote T10, T12, L2, and L4 superior endplate fractures.  IMPRESSION: Acute cholecystitis. Electronically Signed   By: Monte Fantasia M.D.   On: 02/11/2016 00:40   I have personally reviewed and evaluated these images and lab results as part of my medical decision-making.   EKG Interpretation   Date/Time:  Saturday February 11 2016 00:40:49 EDT Ventricular Rate:  89 PR Interval:    QRS Duration: 84 QT Interval:  374 QTC Calculation: 456 R Axis:   4 Text Interpretation:  Sinus rhythm Baseline wander Normal ECG No  significant change since last tracing 13 Jul 2010 Confirmed by Smt. Loder   MD-I, Tiberius Loftus ([id number]) on 02/11/2016 1:02:37 AM      MDM   Final diagnoses:  Acute cholecystitis  Nausea and vomiting, vomiting of unspecified type   Plan admission   Rolland Porter, MD, Barbette Or, MD 02/11/16 [phone]

## 2016-02-11 NOTE — Consult Note (Signed)
Reason for Consult: Right upper quadrant abdominal pain Referring Physician: Dr. Susanne Greenhouse is an 75 y.o. female.  HPI: Patient is a 75 year old white female who presented emergency room yesterday evening with worsening right upper quadrant abdominal pain and nausea. She states this is her first episode of right upper quadrant abdominal pain. She denies any fever, chills, diarrhea, or constipation. She denies any fatty food intolerance or jaundice. While the emergency room, her pain was somewhat controlled with IV pain medication. CT scan the abdomen revealed acute cholecystitis with cholelithiasis. She was admitted by hospitalist for further evaluation and treatment.  Past Medical History  Diagnosis Date  . Breast cancer ([place])   . Hypertension   . GERD (gastroesophageal reflux disease)   . Uterine prolapse   . 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 GERD (gastroesophageal reflux disease) 09/12/2011  . Heart murmur   . Chronic kidney disease   . PONV (postoperative nausea and vomiting)     Past Surgical History  Procedure Laterality Date  . Bilateral mastectomy  07/19/10    bilat mastectomy for DCIS  . Knee surgery Left   . Wrist surgery Left   . Intramedullary (im) nail intertrochanteric Left 12/28/2015    Procedure: INTRAMEDULLARY (IM) NAIL LEFT HIP;  Surgeon: Renette Butters, MD;  Location: [facility];  Service: Orthopedics;  Laterality: Left;    Family History  Problem Relation Age of Onset  . Cancer Mother     melanoma  . Stroke Mother   . Cancer Father     prostate cancer  . Heart disease Father   . Hypertension Father   . Cancer Brother     lung cancer  . Cancer Paternal Uncle     lung cancer  . Hypertension Sister   . Heart attack Brother   . Diabetes Brother   . Diabetes Brother     Social History:  reports that she has quit smoking. Her smoking use included Cigarettes. She quit after 15 years of use. She has never used smokeless tobacco. She  reports that she does not drink alcohol or use illicit drugs.  Allergies:  Allergies  Allergen Reactions  . Celebrex [Celecoxib]   . Sulfa Antibiotics     Had sores in my mouth, bottom of my feet "the skin just came right off"    Medications:  Prior to Admission:  Prescriptions prior to admission  Medication Sig Dispense Refill Last Dose  . acetaminophen (TYLENOL) 325 MG tablet Take 650 mg by mouth every 6 (six) hours as needed for mild pain or moderate pain.   02/10/2016 at early morning  . ALPRAZolam (XANAX) 0.25 MG tablet Take 1 tablet (0.25 mg total) by mouth 2 (two) times daily as needed for anxiety. 15 tablet 0 02/10/2016 at Unknown time  . cetirizine (ZYRTEC) 10 MG tablet Take 1 tablet (10 mg total) by mouth daily as needed for allergies or rhinitis. 30 tablet 11 02/09/2016 at Unknown time  . chlorthalidone (HYGROTON) 25 MG tablet Take 1 tablet (25 mg total) by mouth daily. (Patient taking differently: Take 25 mg by mouth daily as needed (for fluid). ) 12 tablet 0 02/09/2016 at Unknown time  . fluticasone (FLONASE) 50 MCG/ACT nasal spray Place 2 sprays into both nostrils daily. 16 g 6 unknown  . HYDROcodone-acetaminophen (NORCO/VICODIN) 5-325 MG tablet Take 1 tablet by mouth 2 (two) times daily as needed for moderate pain.   02/10/2016 at 1700  . lisinopril (PRINIVIL,ZESTRIL) 10  MG tablet Take 1 tablet (10 mg total) by mouth daily. 30 tablet 3 02/09/2016 at Unknown time  . metroNIDAZOLE (FLAGYL) 500 MG tablet Take 1 tablet (500 mg total) by mouth 3 (three) times daily. 14 tablet 0 02/09/2016 at 1200  . omeprazole (PRILOSEC) 40 MG capsule Take 1 capsule (40 mg total) by mouth daily. 14 capsule 0 02/10/2016 at Unknown time  . traMADol (ULTRAM) 50 MG tablet Take 0.5-1 tablets (25-50 mg total) by mouth every 8 (eight) hours as needed. 30 tablet 0 Past Week at Unknown time  . docusate sodium (COLACE) 100 MG capsule Take 1 capsule (100 mg total) by mouth 2 (two) times daily. (Patient not taking:  Reported on 02/10/2016) 20 capsule 0 Taking   Scheduled: . ampicillin-sulbactam (UNASYN) IV  1.5 g Intravenous Q8H  . heparin  5,000 Units Subcutaneous Q8H  . lisinopril  10 mg Oral Daily  . pantoprazole  40 mg Oral Daily    Results for orders placed or performed during the hospital encounter of 02/10/16 (from the past 48 hour(s))  Lipase, blood     Status: None   Collection Time: 02/10/16 10:06 PM  Result Value Ref Range   Lipase 17 11 - 51 U/L  Comprehensive metabolic panel     Status: Abnormal   Collection Time: 02/10/16 10:06 PM  Result Value Ref Range   Sodium 137 135 - 145 mmol/L   Potassium 4.1 3.5 - 5.1 mmol/L   Chloride 103 101 - 111 mmol/L   CO2 22 22 - 32 mmol/L   Glucose, Bld 172 (H) 65 - 99 mg/dL   BUN 39 (H) 6 - 20 mg/dL   Creatinine, Ser 1.28 (H) 0.44 - 1.00 mg/dL   Calcium 9.3 8.9 - 10.3 mg/dL   Total Protein 8.2 (H) 6.5 - 8.1 g/dL   Albumin 3.7 3.5 - 5.0 g/dL   AST 26 15 - 41 U/L   ALT 14 14 - 54 U/L   Alkaline Phosphatase 127 (H) 38 - 126 U/L   Total Bilirubin 0.9 0.3 - 1.2 mg/dL   GFR calc non Af Amer 40 (L) >60 mL/min   GFR calc Af Amer 46 (L) >60 mL/min    Comment: (NOTE) The eGFR has been calculated using the CKD EPI equation. This calculation has not been validated in all clinical situations. eGFR's persistently <60 mL/min signify possible Chronic Kidney Disease.    Anion gap 12 5 - 15  CBC     Status: Abnormal   Collection Time: 02/10/16 10:06 PM  Result Value Ref Range   WBC 14.6 (H) 4.0 - 10.5 K/uL   RBC 3.29 (L) 3.87 - 5.11 MIL/uL   Hemoglobin 9.3 (L) 12.0 - 15.0 g/dL   HCT 29.4 (L) 36.0 - 46.0 %   MCV 89.4 78.0 - 100.0 fL   MCH 28.3 26.0 - 34.0 pg   MCHC 31.6 30.0 - 36.0 g/dL   RDW 14.5 11.5 - 15.5 %   Platelets 377 150 - 400 K/uL  Troponin I     Status: None   Collection Time: 02/10/16 10:06 PM  Result Value Ref Range   Troponin I <0.03 <0.03 ng/mL  Urinalysis, Routine w reflex microscopic     Status: Abnormal   Collection Time:  02/11/16 12:04 AM  Result Value Ref Range   Color, Urine YELLOW YELLOW   APPearance CLEAR CLEAR   Specific Gravity, Urine 1.020 1.005 - 1.030   pH 5.0 5.0 - 8.0   Glucose, UA NEGATIVE  NEGATIVE mg/dL   Hgb urine dipstick SMALL (A) NEGATIVE   Bilirubin Urine NEGATIVE NEGATIVE   Ketones, ur NEGATIVE NEGATIVE mg/dL   Protein, ur 30 (A) NEGATIVE mg/dL   Nitrite NEGATIVE NEGATIVE   Leukocytes, UA MODERATE (A) NEGATIVE  Urine microscopic-add on     Status: Abnormal   Collection Time: 02/11/16 12:04 AM  Result Value Ref Range   Squamous Epithelial / LPF 0-5 (A) NONE SEEN   WBC, UA 0-5 0 - 5 WBC/hpf   RBC / HPF 0-5 0 - 5 RBC/hpf   Bacteria, UA RARE (A) NONE SEEN    Dg Chest 2 View  02/10/2016  CLINICAL DATA:  Crackles.  Nausea, vomiting, and abdominal pain. EXAM: CHEST  2 VIEW COMPARISON:  12/27/2015 chest/rib radiographs. 05/13/2015 chest radiographs. FINDINGS: The cardiomediastinal silhouette is within normal limits. The patient has taken a shallower inspiration than on the prior study. Chronically increased interstitial markings throughout both lungs are unchanged. A wheelchair partially obscures the posterior chest on the lateral radiograph. Within this limitation, no airspace consolidation, edema, pleural effusion, or pneumothorax is identified. Lower thoracic and upper lumbar compression fractures are unchanged. A calcified gallstone is again seen. IMPRESSION: No active cardiopulmonary disease. Electronically Signed   By: Logan Bores M.D.   On: 02/10/2016 21:50   Ct Renal Stone Study  02/11/2016  CLINICAL DATA:  Right flank and upper quadrant pain. EXAM: CT ABDOMEN AND PELVIS WITHOUT CONTRAST TECHNIQUE: Multidetector CT imaging of the abdomen and pelvis was performed following the standard protocol without IV contrast. COMPARISON:  None. FINDINGS: Lower chest and abdominal wall:  No contributory findings. Hepatobiliary: No focal liver abnormality.Over distended gallbladder with large stone in  the neck. Extensive fat edema around the gallbladder fossa. Normal common bile duct diameter. Pancreas: Fatty atrophy of the pancreas. Spleen: Tiny incidental low densities in the central spleen. Adrenals/Urinary Tract: Negative adrenals. Bilateral renal atrophy with punctate lower pole stone on the right. No hydronephrosis or ureteral calculus. Unremarkable bladder. Stomach/Bowel:  No obstruction. No appendicitis. Reproductive:Hysterectomy.  Negative adnexae.  Pessary in place. Vascular/Lymphatic: No acute vascular abnormality. Mild fat haziness in the small bowel mesentery, likely incidental mesenteric panniculitis in this setting. Other: Trace ascites considered reactive. Musculoskeletal: Remote posterior left eighth and ninth rib fractures that are chronic but without completed healing. Intertrochanteric left femur fracture repair without acute finding. Remote T10, T12, L2, and L4 superior endplate fractures. IMPRESSION: Acute cholecystitis. Electronically Signed   By: Monte Fantasia M.D.   On: 02/11/2016 00:40    ROS:  Pertinent items noted in HPI and remainder of comprehensive ROS otherwise negative.  Blood pressure 132/56, pulse 87, temperature 99.1 F (37.3 C), temperature source Oral, resp. rate 20, height _0  (1.575 m), weight 64.955 kg (143 lb 3.2 oz), SpO2 100 %. Physical Exam: Pleasant white female in no acute distress. Head examination is normocephalic, atraumatic. Eye examination reveals no scleral icterus. Neck is supple without lymphadenopathy or carotid bruits. Heart examination reveals a regular rate and rhythm without S3, S4, murmurs. The abdomen is soft with slight tenderness to deep palpation in the right upper quadrant. No hepatosplenomegaly or masses noted. No rigidity is noted. No significant distention is noted.  Assessment/Plan: Impression: Acute cholecystitis, cholelithiasis Plan: We'll get ultrasound the gallbladder this morning to further delineate the hepatobiliary  tree. Agree with continuing Unasyn. She may be a clear liquid diet after the ultrasound. Further management pending ultrasound results.  Lakeithia Rasor A 02/11/2016, 8:44 AM

## 2016-02-12 LAB — COMPREHENSIVE METABOLIC PANEL
ALBUMIN: 2.5 g/dL — AB (ref 3.5–5.0)
ALT: 13 U/L — ABNORMAL LOW (ref 14–54)
ANION GAP: 8 (ref 5–15)
AST: 23 U/L (ref 15–41)
Alkaline Phosphatase: 110 U/L (ref 38–126)
BUN: 31 mg/dL — ABNORMAL HIGH (ref 6–20)
CALCIUM: 8.2 mg/dL — AB (ref 8.9–10.3)
CHLORIDE: 108 mmol/L (ref 101–111)
CO2: 22 mmol/L (ref 22–32)
Creatinine, Ser: 1.29 mg/dL — ABNORMAL HIGH (ref 0.44–1.00)
GFR calc non Af Amer: 39 mL/min — ABNORMAL LOW (ref >60)
GFR, EST AFRICAN AMERICAN: 46 mL/min — AB (ref >60)
GLUCOSE: 140 mg/dL — AB (ref 65–99)
POTASSIUM: 3.5 mmol/L (ref 3.5–5.1)
SODIUM: 138 mmol/L (ref 135–145)
Total Bilirubin: 0.9 mg/dL (ref 0.3–1.2)
Total Protein: 6.3 g/dL — ABNORMAL LOW (ref 6.5–8.1)

## 2016-02-12 LAB — CBC
HEMATOCRIT: 25.6 % — AB (ref 36.0–46.0)
HEMOGLOBIN: 7.9 g/dL — AB (ref 12.0–15.0)
MCH: 28.3 pg (ref 26.0–34.0)
MCHC: 30.9 g/dL (ref 30.0–36.0)
MCV: 91.8 fL (ref 78.0–100.0)
Platelets: 341 10*3/uL (ref 150–400)
RBC: 2.79 MIL/uL — AB (ref 3.87–5.11)
RDW: 14.6 % (ref 11.5–15.5)
WBC: 18.5 10*3/uL — ABNORMAL HIGH (ref 4.0–10.5)

## 2016-02-12 MED ORDER — ENOXAPARIN SODIUM 40 MG/0.4ML ~~LOC~~ SOLN
40.0000 mg | SUBCUTANEOUS | Status: DC
  Administered 2016-02-12 – 2016-02-15 (×3): 40 mg via SUBCUTANEOUS
  Filled 2016-02-12 (×3): qty 0.4

## 2016-02-12 MED ORDER — POTASSIUM CHLORIDE 20 MEQ/15ML (10%) PO SOLN
40.0000 meq | Freq: Once | ORAL | Status: DC
  Filled 2016-02-12: qty 30

## 2016-02-12 NOTE — Care Management Important Message (Signed)
Important Message  Patient Details  Name: Christie Bruce MRN: [id number] Date of Birth: 1941/03/22   Medicare Important Message Given:  Yes    Briant Sites, RN 02/12/2016, 5:05 PM

## 2016-02-12 NOTE — Progress Notes (Signed)
PROGRESS NOTE                                                                                                                                                                                                             Patient Demographics:    Christie Bruce, is a 75 y.o. female, DOB - 08-02-1940, [id number]  Admit date - 02/10/2016   Admitting Physician Orvan Falconer, MD  Outpatient Primary MD for the patient is Chevis Pretty, [place]  LOS - 1  Chief Complaint  Patient presents with  . Abdominal Pain       Brief Narrative    Christie Bruce is a 75 y.o. female with medical history significant of GERD, Essential Hypertension, GAD, CKD stage 3, Anemia, and breast cancer, presents to the ED with complaints of bilateral lower abdominal pain that radiated up to her chest into her right flank that onset around 1:30am this morning. She was evaluated Friday evening in an urgent care clinic and was noted to have a temperature of 99 and was advised to come to the ED. She reports that her pain is intermittent and last for about 20 to 30 minutes each episode. She reported that the pain is similar to when she had kidney stones 15 years ago. She reports associated nausea and has been vomiting 3 to 4 times a day for the past 2 days. Reports mild bloating and burping. She also noted a decrease in appetite 2 days ago as well. She denies diarrhea, dysuria, coughing, urinary frequency, and constipation. Pt reports falling often, Denies alcohol use. Denies DM or previous heart problems.  ED Course: What in the ED, Troponin <0.03, BUN 39, Creatinine 1.28, Glucose 172, WBC 14.6, Hgb 9.3, HCT 29.4, UA unremarkable. CT abdomen/pelvis shows acute cholecystitis. CXR unremarkable. EKG sinus rhythm. Hospitalist was asked to refer for admission for treatment of acute cholecystitis.    Subjective:    [name] today has, No headache, No chest pain, +ve RUQ  abdominal pain - No Nausea, No new weakness tingling or numbness, No Cough - SOB.     Assessment  & Plan :     1.Acute cholecystitis. Seen by general surgery, currently on clear liquids and Unasyn, per general surgery and continue supportive care with IV fluids and pain control, Right upper quadrant ultrasound confirms acute cholecystitis with gallbladder  neck stone, general surgery following, defer timing of surgery to general surgery. Stable baseline EKG and two-view chest x-ray.  2. GERD. On PPI.  3. Essential hypertension. Low-dose ACE inhibitor to continue.  4. Recent left intertrochanteric fracture. 6 Weeks ago. Status postsurgical repair. Weightbearing as tolerated, initiate PT.    Family Communication  :  None present  Code Status :  Full  Diet : Clear liquids per surgery  Disposition Plan  : Remain inpatient  Consults  :  Gen. surgery  Procedures  :   CT abd Pelvis - Acute cholecystitis.  Right upper quadrant ultrasound - With obstructing stone at the gallbladder neck and suggestion of acute cholecystitis  DVT Prophylaxis  :   Heparin   Lab Results  Component Value Date   PLT 341 02/12/2016    Inpatient Medications  Scheduled Meds: . sodium chloride   Intravenous Once  . ampicillin-sulbactam (UNASYN) IV  1.5 g Intravenous Q8H  . heparin  5,000 Units Subcutaneous Q8H  . lisinopril  10 mg Oral Daily  . pantoprazole  40 mg Oral Daily   Continuous Infusions: . dextrose 5 % and 0.9% NaCl 75 mL/hr at 02/12/16 0456   PRN Meds:.acetaminophen, ALPRAZolam, fentaNYL (SUBLIMAZE) injection, ondansetron **OR** ondansetron (ZOFRAN) IV  Antibiotics  :    Anti-infectives    Start     Dose/Rate Route Frequency Ordered Stop   02/11/16 0930  ampicillin-sulbactam (UNASYN) 1.5 g in sodium chloride 0.9 % 50 mL IVPB     1.5 g 100 mL/hr over 30 Minutes Intravenous Every 8 hours 02/11/16 0257     02/11/16 0115  Ampicillin-Sulbactam (UNASYN) 3 g in sodium chloride 0.9 % 100  mL IVPB     3 g 100 mL/hr over 60 Minutes Intravenous  Once 02/11/16 0101 02/11/16 0221         Objective:   Vitals:   02/11/16 0318 02/11/16 1400 02/11/16 2114 02/12/16 0633  BP: (!) 132/56 (!) 135/53 (!) 136/56 (!) 127/55  Pulse: 87 86 91 88  Resp: 20 20 20 20   Temp: 99.1 F (37.3 C) 99.5 F (37.5 C) 99.5 F (37.5 C) 98.6 F (37 C)  TempSrc: Oral Oral Oral Oral  SpO2: 100% 92% 92% 93%  Weight: 65 kg (143 lb 3.2 oz)     Height:        Wt Readings from Last 3 Encounters:  02/11/16 65 kg (143 lb 3.2 oz)  02/08/16 64.8 kg (142 lb 14.4 oz)  02/07/16 64.7 kg (142 lb 9.6 oz)     Intake/Output Summary (Last 24 hours) at 02/12/16 0823 Last data filed at 02/12/16 0500  Gross per 24 hour  Intake          1823.75 ml  Output                0 ml  Net          1823.75 ml     Physical Exam  Awake Alert, Oriented X 3, No new F.N deficits, Normal affect [place].AT,PERRAL Supple Neck,No JVD, No cervical lymphadenopathy appriciated.  Symmetrical Chest wall movement, Good air movement bilaterally, CTAB RRR,No Gallops,Rubs or new Murmurs, No Parasternal Heave +ve B.Sounds, Abd Soft, +ve RUQ tenderness, No organomegaly appriciated, No rebound - guarding or rigidity. No Cyanosis, Clubbing or edema, No new Rash or bruise      Data Review:    CBC  Recent Labs Lab 02/07/16 1643 02/10/16 2206 02/12/16 0605  WBC 7.1 14.6* 18.5*  HGB  --  9.3* 7.9*  HCT 28.6* 29.4* 25.6*  PLT 298 377 341  MCV 90 89.4 91.8  MCH 27.9 28.3 28.3  MCHC 31.1* 31.6 30.9  RDW 14.8 14.5 14.6  LYMPHSABS 2.5  --   --   EOSABS 0.2  --   --   BASOSABS 0.0  --   --     Chemistries   Recent Labs Lab 02/07/16 1643 02/10/16 2206 02/12/16 0605  NA 141 137 138  K 4.4 4.1 3.5  CL 98 103 108  CO2 22 22 22   GLUCOSE 94 172* 140*  BUN 37* 39* 31*  CREATININE 1.52* 1.28* 1.29*  CALCIUM 9.1 9.3 8.2*  AST 28 26 23   ALT 14 14 13*  ALKPHOS 145* 127* 110  BILITOT 0.7 0.9 0.9    ------------------------------------------------------------------------------------------------------------------ No results for input(s): CHOL, HDL, LDLCALC, TRIG, CHOLHDL, LDLDIRECT in the last 72 hours.  No results found for: HGBA1C ------------------------------------------------------------------------------------------------------------------ No results for input(s): TSH, T4TOTAL, T3FREE, THYROIDAB in the last 72 hours.  Invalid input(s): FREET3 ------------------------------------------------------------------------------------------------------------------ No results for input(s): VITAMINB12, FOLATE, FERRITIN, TIBC, IRON, RETICCTPCT in the last 72 hours.  Coagulation profile  Recent Labs Lab 02/11/16 1722  INR 1.21    No results for input(s): DDIMER in the last 72 hours.  Cardiac Enzymes  Recent Labs Lab 02/10/16 2206  TROPONINI <0.03   ------------------------------------------------------------------------------------------------------------------ No results found for: BNP  Micro Results No results found for this or any previous visit (from the past 240 hour(s)).  Radiology Reports Dg Chest 2 View  Result Date: 02/10/2016 CLINICAL DATA:  Crackles.  Nausea, vomiting, and abdominal pain. EXAM: CHEST  2 VIEW COMPARISON:  12/27/2015 chest/rib radiographs. 05/13/2015 chest radiographs. FINDINGS: The cardiomediastinal silhouette is within normal limits. The patient has taken a shallower inspiration than on the prior study. Chronically increased interstitial markings throughout both lungs are unchanged. A wheelchair partially obscures the posterior chest on the lateral radiograph. Within this limitation, no airspace consolidation, edema, pleural effusion, or pneumothorax is identified. Lower thoracic and upper lumbar compression fractures are unchanged. A calcified gallstone is again seen. IMPRESSION: No active cardiopulmonary disease. Electronically Signed   By: Logan Bores M.D.   On: 02/10/2016 21:50   Ct Renal Stone Study  Result Date: 02/11/2016 CLINICAL DATA:  Right flank and upper quadrant pain. EXAM: CT ABDOMEN AND PELVIS WITHOUT CONTRAST TECHNIQUE: Multidetector CT imaging of the abdomen and pelvis was performed following the standard protocol without IV contrast. COMPARISON:  None. FINDINGS: Lower chest and abdominal wall:  No contributory findings. Hepatobiliary: No focal liver abnormality.Over distended gallbladder with large stone in the neck. Extensive fat edema around the gallbladder fossa. Normal common bile duct diameter. Pancreas: Fatty atrophy of the pancreas. Spleen: Tiny incidental low densities in the central spleen. Adrenals/Urinary Tract: Negative adrenals. Bilateral renal atrophy with punctate lower pole stone on the right. No hydronephrosis or ureteral calculus. Unremarkable bladder. Stomach/Bowel:  No obstruction. No appendicitis. Reproductive:Hysterectomy.  Negative adnexae.  Pessary in place. Vascular/Lymphatic: No acute vascular abnormality. Mild fat haziness in the small bowel mesentery, likely incidental mesenteric panniculitis in this setting. Other: Trace ascites considered reactive. Musculoskeletal: Remote posterior left eighth and ninth rib fractures that are chronic but without completed healing. Intertrochanteric left femur fracture repair without acute finding. Remote T10, T12, L2, and L4 superior endplate fractures. IMPRESSION: Acute cholecystitis. Electronically Signed   By: Monte Fantasia M.D.   On: 02/11/2016 00:40   US Abdomen Limited Ruq  Result Date: 02/11/2016 CLINICAL DATA:  Cholelithiasis and  acute cholecystitis as seen on CT imaging. EXAM: US ABDOMEN LIMITED - RIGHT UPPER QUADRANT COMPARISON:  February 11, 2016 FINDINGS: Gallbladder: There is a non mobile stone in the neck of the gallbladder resulting in wall thickening. Pericholecystic fluid was also seen on the CT scan but is not appreciated on this ultrasound. No [name]'s  sign reported. The stone in the neck of the gallbladder measures 2.4 cm. Common bile duct: Diameter: 4 mm Liver: No focal lesion identified. Within normal limits in parenchymal echogenicity. IMPRESSION: There is a 2.4 cm stone lodged in the neck of the gallbladder resulting in wall thickening. On CT imaging, there was also gallbladder distention and pericholecystic fluid in addition to adjacent fat stranding. The combination of ultrasound and CT findings are consistent with acute cholecystitis. Electronically Signed   By: Dorise Bullion III M.D   On: 02/11/2016 11:43    Time Spent in minutes  30   Lala Lund K M.D on 02/12/2016 at 8:23 AM  Between 7am to 7pm - Pager - [phone]  After 7pm go to [email or web address] - password [facility]  [facility] -  Office  [phone]

## 2016-02-12 NOTE — Progress Notes (Signed)
Subjective: Patient denies a significant abdominal pain. Is thirsty.  Objective: Vital signs in last 24 hours: Temp:  [98.6 F (37 C)-99.5 F (37.5 C)] 98.6 F (37 C) (07/23 [id number]) Pulse Rate:  [86-91] 88 (07/23 0633) Resp:  [20] 20 (07/23 [id number]) BP: (127-136)/(53-56) 127/55 (07/23 0633) SpO2:  [92 %-93 %] 93 % (07/23 [id number])    Intake/Output from previous day: 07/22 0701 - 07/23 0700 In: 1823.8 [P.O.:240; I.V.:1433.8; IV Piggyback:150] Out: -  Intake/Output this shift: No intake/output data recorded.  General appearance: alert, cooperative and no distress GI: Soft with fullness noted in the right upper quadrant to palpation. No rigidity noted. Not particularly tender.  Lab Results:   Recent Labs  02/10/16 2206 02/12/16 0605  WBC 14.6* 18.5*  HGB 9.3* 7.9*  HCT 29.4* 25.6*  PLT 377 341   BMET  Recent Labs  02/10/16 2206 02/12/16 0605  NA 137 138  K 4.1 3.5  CL 103 108  CO2 22 22  GLUCOSE 172* 140*  BUN 39* 31*  CREATININE 1.28* 1.29*  CALCIUM 9.3 8.2*   PT/INR  Recent Labs  02/11/16 1722  LABPROT 15.4*  INR 1.21    Studies/Results: Dg Chest 2 View  Result Date: 02/10/2016 CLINICAL DATA:  Crackles.  Nausea, vomiting, and abdominal pain. EXAM: CHEST  2 VIEW COMPARISON:  12/27/2015 chest/rib radiographs. 05/13/2015 chest radiographs. FINDINGS: The cardiomediastinal silhouette is within normal limits. The patient has taken a shallower inspiration than on the prior study. Chronically increased interstitial markings throughout both lungs are unchanged. A wheelchair partially obscures the posterior chest on the lateral radiograph. Within this limitation, no airspace consolidation, edema, pleural effusion, or pneumothorax is identified. Lower thoracic and upper lumbar compression fractures are unchanged. A calcified gallstone is again seen. IMPRESSION: No active cardiopulmonary disease. Electronically Signed   By: Logan Bores M.D.   On: 02/10/2016 21:50   Ct  Renal Stone Study  Result Date: 02/11/2016 CLINICAL DATA:  Right flank and upper quadrant pain. EXAM: CT ABDOMEN AND PELVIS WITHOUT CONTRAST TECHNIQUE: Multidetector CT imaging of the abdomen and pelvis was performed following the standard protocol without IV contrast. COMPARISON:  None. FINDINGS: Lower chest and abdominal wall:  No contributory findings. Hepatobiliary: No focal liver abnormality.Over distended gallbladder with large stone in the neck. Extensive fat edema around the gallbladder fossa. Normal common bile duct diameter. Pancreas: Fatty atrophy of the pancreas. Spleen: Tiny incidental low densities in the central spleen. Adrenals/Urinary Tract: Negative adrenals. Bilateral renal atrophy with punctate lower pole stone on the right. No hydronephrosis or ureteral calculus. Unremarkable bladder. Stomach/Bowel:  No obstruction. No appendicitis. Reproductive:Hysterectomy.  Negative adnexae.  Pessary in place. Vascular/Lymphatic: No acute vascular abnormality. Mild fat haziness in the small bowel mesentery, likely incidental mesenteric panniculitis in this setting. Other: Trace ascites considered reactive. Musculoskeletal: Remote posterior left eighth and ninth rib fractures that are chronic but without completed healing. Intertrochanteric left femur fracture repair without acute finding. Remote T10, T12, L2, and L4 superior endplate fractures. IMPRESSION: Acute cholecystitis. Electronically Signed   By: Monte Fantasia M.D.   On: 02/11/2016 00:40   US Abdomen Limited Ruq  Result Date: 02/11/2016 CLINICAL DATA:  Cholelithiasis and acute cholecystitis as seen on CT imaging. EXAM: US ABDOMEN LIMITED - RIGHT UPPER QUADRANT COMPARISON:  February 11, 2016 FINDINGS: Gallbladder: There is a non mobile stone in the neck of the gallbladder resulting in wall thickening. Pericholecystic fluid was also seen on the CT scan but is not appreciated on this ultrasound. No  [name]'s sign reported. The stone in the neck of the  gallbladder measures 2.4 cm. Common bile duct: Diameter: 4 mm Liver: No focal lesion identified. Within normal limits in parenchymal echogenicity. IMPRESSION: There is a 2.4 cm stone lodged in the neck of the gallbladder resulting in wall thickening. On CT imaging, there was also gallbladder distention and pericholecystic fluid in addition to adjacent fat stranding. The combination of ultrasound and CT findings are consistent with acute cholecystitis. Electronically Signed   By: Dorise Bullion III M.D   On: 02/11/2016 11:43    Anti-infectives: Anti-infectives    Start     Dose/Rate Route Frequency Ordered Stop   02/11/16 0930  ampicillin-sulbactam (UNASYN) 1.5 g in sodium chloride 0.9 % 50 mL IVPB     1.5 g 100 mL/hr over 30 Minutes Intravenous Every 8 hours 02/11/16 0257     02/11/16 0115  Ampicillin-Sulbactam (UNASYN) 3 g in sodium chloride 0.9 % 100 mL IVPB     3 g 100 mL/hr over 60 Minutes Intravenous  Once 02/11/16 0101 02/11/16 0221      Assessment/Plan: Impression: Acute cholecystitis, cholelithiasis. Plan: Patient be taken to the operating room tomorrow for laparoscopic cholecystectomy. The risks and benefits of the procedure including bleeding, infection, hepatobiliary injury, the possibility of an open procedure were fully explained to the patient, who gave informed consent.  LOS: 1 day    Christie Bruce A 02/12/2016

## 2016-02-13 ENCOUNTER — Encounter (HOSPITAL_COMMUNITY): Payer: Self-pay | Admitting: *Deleted

## 2016-02-13 ENCOUNTER — Encounter (HOSPITAL_COMMUNITY)
Admission: EM | Disposition: A | Discharge status: Home Health | Payer: Self-pay | Source: Home / Self Care | Attending: Internal Medicine

## 2016-02-13 ENCOUNTER — Inpatient Hospital Stay (HOSPITAL_COMMUNITY): Payer: Medicare Other | Admitting: Anesthesiology

## 2016-02-13 HISTORY — PX: CHOLECYSTECTOMY: SHX55

## 2016-02-13 LAB — COMPREHENSIVE METABOLIC PANEL
ALT: 14 U/L (ref 14–54)
ANION GAP: 10 (ref 5–15)
AST: 30 U/L (ref 15–41)
Albumin: 2.3 g/dL — ABNORMAL LOW (ref 3.5–5.0)
Alkaline Phosphatase: 130 U/L — ABNORMAL HIGH (ref 38–126)
BILIRUBIN TOTAL: 1 mg/dL (ref 0.3–1.2)
BUN: 28 mg/dL — ABNORMAL HIGH (ref 6–20)
CO2: 22 mmol/L (ref 22–32)
Calcium: 8 mg/dL — ABNORMAL LOW (ref 8.9–10.3)
Chloride: 111 mmol/L (ref 101–111)
Creatinine, Ser: 1.29 mg/dL — ABNORMAL HIGH (ref 0.44–1.00)
GFR calc Af Amer: 46 mL/min — ABNORMAL LOW (ref >60)
GFR, EST NON AFRICAN AMERICAN: 39 mL/min — AB (ref >60)
Glucose, Bld: 106 mg/dL — ABNORMAL HIGH (ref 65–99)
POTASSIUM: 3.2 mmol/L — AB (ref 3.5–5.1)
Sodium: 143 mmol/L (ref 135–145)
TOTAL PROTEIN: 6.1 g/dL — AB (ref 6.5–8.1)

## 2016-02-13 LAB — CBC
HEMATOCRIT: 25.1 % — AB (ref 36.0–46.0)
Hemoglobin: 7.8 g/dL — ABNORMAL LOW (ref 12.0–15.0)
MCH: 28.4 pg (ref 26.0–34.0)
MCHC: 31.1 g/dL (ref 30.0–36.0)
MCV: 91.3 fL (ref 78.0–100.0)
Platelets: 385 10*3/uL (ref 150–400)
RBC: 2.75 MIL/uL — ABNORMAL LOW (ref 3.87–5.11)
RDW: 14.6 % (ref 11.5–15.5)
WBC: 14.4 10*3/uL — ABNORMAL HIGH (ref 4.0–10.5)

## 2016-02-13 LAB — PREPARE RBC (CROSSMATCH)

## 2016-02-13 LAB — ABO/RH: ABO/RH(D): O NEG

## 2016-02-13 LAB — HEMOGLOBIN AND HEMATOCRIT, BLOOD
HEMATOCRIT: 32.5 % — AB (ref 36.0–46.0)
HEMOGLOBIN: 10.3 g/dL — AB (ref 12.0–15.0)

## 2016-02-13 SURGERY — LAPAROSCOPIC CHOLECYSTECTOMY
Anesthesia: General | Site: Abdomen

## 2016-02-13 MED ORDER — FUROSEMIDE 10 MG/ML IJ SOLN
40.0000 mg | Freq: Once | INTRAMUSCULAR | Status: AC
  Administered 2016-02-13: 40 mg via INTRAVENOUS
  Filled 2016-02-13: qty 4

## 2016-02-13 MED ORDER — POVIDONE-IODINE 10 % EX OINT
TOPICAL_OINTMENT | CUTANEOUS | Status: AC
  Filled 2016-02-13: qty 1

## 2016-02-13 MED ORDER — BUPIVACAINE HCL (PF) 0.5 % IJ SOLN
INTRAMUSCULAR | Status: AC
  Filled 2016-02-13: qty 30

## 2016-02-13 MED ORDER — HEMOSTATIC AGENTS (NO CHARGE) OPTIME
TOPICAL | Status: DC | PRN
  Administered 2016-02-13: 1 via TOPICAL

## 2016-02-13 MED ORDER — SODIUM CHLORIDE 0.9 % IV SOLN: Freq: Once | INTRAVENOUS | Status: DC

## 2016-02-13 MED ORDER — NEOSTIGMINE METHYLSULFATE 10 MG/10ML IV SOLN
INTRAVENOUS | Status: DC | PRN
  Administered 2016-02-13: 2 mg via INTRAVENOUS

## 2016-02-13 MED ORDER — CHLORHEXIDINE GLUCONATE CLOTH 2 % EX PADS: 6.0000 | MEDICATED_PAD | Freq: Once | CUTANEOUS | Status: DC

## 2016-02-13 MED ORDER — POTASSIUM CHLORIDE 10 MEQ/100ML IV SOLN: 10.0000 meq | INTRAVENOUS | Status: AC

## 2016-02-13 MED ORDER — PROPOFOL 10 MG/ML IV BOLUS
INTRAVENOUS | Status: DC | PRN
  Administered 2016-02-13: 130 mg via INTRAVENOUS

## 2016-02-13 MED ORDER — FENTANYL CITRATE (PF) 100 MCG/2ML IJ SOLN
INTRAMUSCULAR | Status: AC
  Filled 2016-02-13: qty 2

## 2016-02-13 MED ORDER — ONDANSETRON HCL 4 MG/2ML IJ SOLN
4.0000 mg | Freq: Once | INTRAMUSCULAR | Status: AC
  Administered 2016-02-13: 4 mg via INTRAVENOUS

## 2016-02-13 MED ORDER — HYDRALAZINE HCL 20 MG/ML IJ SOLN: 10.0000 mg | Freq: Four times a day (QID) | INTRAMUSCULAR | Status: DC | PRN

## 2016-02-13 MED ORDER — GLYCOPYRROLATE 0.2 MG/ML IJ SOLN
INTRAMUSCULAR | Status: AC
  Filled 2016-02-13: qty 4

## 2016-02-13 MED ORDER — MIDAZOLAM HCL 5 MG/5ML IJ SOLN
INTRAMUSCULAR | Status: DC | PRN
  Administered 2016-02-13: 2 mg via INTRAVENOUS

## 2016-02-13 MED ORDER — MIDAZOLAM HCL 2 MG/2ML IJ SOLN
INTRAMUSCULAR | Status: AC
  Filled 2016-02-13: qty 2

## 2016-02-13 MED ORDER — FENTANYL CITRATE (PF) 100 MCG/2ML IJ SOLN: 25.0000 ug | INTRAMUSCULAR | Status: DC | PRN

## 2016-02-13 MED ORDER — POVIDONE-IODINE 10 % OINT PACKET
TOPICAL_OINTMENT | CUTANEOUS | Status: DC | PRN
  Administered 2016-02-13: 1 via TOPICAL

## 2016-02-13 MED ORDER — SODIUM CHLORIDE 0.9 % IR SOLN
Status: DC | PRN
  Administered 2016-02-13: 3000 mL

## 2016-02-13 MED ORDER — SODIUM CHLORIDE 0.9 % IV SOLN
INTRAVENOUS | Status: DC
  Administered 2016-02-13: 15:00:00 via INTRAVENOUS

## 2016-02-13 MED ORDER — ROCURONIUM BROMIDE 100 MG/10ML IV SOLN
INTRAVENOUS | Status: DC | PRN
  Administered 2016-02-13: 5 mg via INTRAVENOUS
  Administered 2016-02-13: 20 mg via INTRAVENOUS

## 2016-02-13 MED ORDER — CHLORHEXIDINE GLUCONATE CLOTH 2 % EX PADS
6.0000 | MEDICATED_PAD | Freq: Once | CUTANEOUS | Status: DC
  Administered 2016-02-13: 6 via TOPICAL

## 2016-02-13 MED ORDER — KETOROLAC TROMETHAMINE 30 MG/ML IJ SOLN
INTRAMUSCULAR | Status: AC
  Filled 2016-02-13: qty 1

## 2016-02-13 MED ORDER — ONDANSETRON HCL 4 MG/2ML IJ SOLN: 4.0000 mg | Freq: Once | INTRAMUSCULAR | Status: DC | PRN

## 2016-02-13 MED ORDER — LIDOCAINE HCL 1 % IJ SOLN
INTRAMUSCULAR | Status: DC | PRN
  Administered 2016-02-13: 25 mg via INTRADERMAL

## 2016-02-13 MED ORDER — FENTANYL CITRATE (PF) 100 MCG/2ML IJ SOLN
INTRAMUSCULAR | Status: DC | PRN
  Administered 2016-02-13: 50 ug via INTRAVENOUS

## 2016-02-13 MED ORDER — DEXTROSE-NACL 5-0.9 % IV SOLN: INTRAVENOUS | Status: DC

## 2016-02-13 MED ORDER — METOPROLOL TARTRATE 5 MG/5ML IV SOLN: 5.0000 mg | INTRAVENOUS | Status: DC | PRN

## 2016-02-13 MED ORDER — METOPROLOL TARTRATE 5 MG/5ML IV SOLN: 1.0000 mg | INTRAVENOUS | Status: DC | PRN

## 2016-02-13 MED ORDER — HYDROCODONE-ACETAMINOPHEN 5-325 MG PO TABS: 1.0000 | ORAL_TABLET | ORAL | Status: DC | PRN

## 2016-02-13 MED ORDER — METOPROLOL TARTRATE 5 MG/5ML IV SOLN
INTRAVENOUS | Status: AC
  Filled 2016-02-13: qty 5

## 2016-02-13 MED ORDER — GLYCOPYRROLATE 0.2 MG/ML IJ SOLN
INTRAMUSCULAR | Status: DC | PRN
  Administered 2016-02-13: 0.6 mg via INTRAVENOUS

## 2016-02-13 MED ORDER — LIDOCAINE HCL (PF) 1 % IJ SOLN
INTRAMUSCULAR | Status: AC
  Filled 2016-02-13: qty 5

## 2016-02-13 MED ORDER — ONDANSETRON HCL 4 MG/2ML IJ SOLN
INTRAMUSCULAR | Status: AC
  Filled 2016-02-13: qty 2

## 2016-02-13 MED ORDER — ALBUTEROL SULFATE (2.5 MG/3ML) 0.083% IN NEBU: 2.5000 mg | INHALATION_SOLUTION | RESPIRATORY_TRACT | Status: DC | PRN

## 2016-02-13 MED ORDER — BUPIVACAINE HCL (PF) 0.5 % IJ SOLN
INTRAMUSCULAR | Status: DC | PRN
  Administered 2016-02-13: 10 mL

## 2016-02-13 MED ORDER — ROCURONIUM BROMIDE 50 MG/5ML IV SOLN
INTRAVENOUS | Status: AC
  Filled 2016-02-13: qty 1

## 2016-02-13 MED ORDER — SODIUM CHLORIDE 0.9 % IJ SOLN
INTRAMUSCULAR | Status: AC
  Filled 2016-02-13: qty 10

## 2016-02-13 MED ORDER — LACTATED RINGERS IV SOLN
INTRAVENOUS | Status: DC
  Administered 2016-02-13: 10:00:00 via INTRAVENOUS

## 2016-02-13 MED ORDER — SIMETHICONE 80 MG PO CHEW: 40.0000 mg | CHEWABLE_TABLET | Freq: Four times a day (QID) | ORAL | Status: DC | PRN

## 2016-02-13 MED ORDER — MIDAZOLAM HCL 2 MG/2ML IJ SOLN
INTRAMUSCULAR | Status: AC
Start: 2016-02-13 — End: 2016-02-13
  Filled 2016-02-13: qty 2

## 2016-02-13 MED ORDER — EPHEDRINE SULFATE 50 MG/ML IJ SOLN
INTRAMUSCULAR | Status: AC
  Filled 2016-02-13: qty 1

## 2016-02-13 MED ORDER — MIDAZOLAM HCL 2 MG/2ML IJ SOLN
1.0000 mg | INTRAMUSCULAR | Status: DC | PRN
  Administered 2016-02-13: 2 mg via INTRAVENOUS

## 2016-02-13 MED ORDER — MORPHINE SULFATE (PF) 2 MG/ML IV SOLN
1.0000 mg | INTRAVENOUS | Status: DC | PRN
Start: 2016-02-13 — End: 2016-02-15

## 2016-02-13 MED ORDER — SUCCINYLCHOLINE CHLORIDE 20 MG/ML IJ SOLN
INTRAMUSCULAR | Status: DC | PRN
  Administered 2016-02-13: 140 mg via INTRAVENOUS

## 2016-02-13 SURGICAL SUPPLY — 49 items
APL SRG 38 LTWT LNG FL B (MISCELLANEOUS) ×1
APPLICATOR ARISTA FLEXITIP XL (MISCELLANEOUS) ×2 IMPLANT
APPLIER CLIP LAPSCP 10X32 DD (CLIP) ×3 IMPLANT
BAG HAMPER (MISCELLANEOUS) ×3 IMPLANT
BAG SPEC RTRVL LRG 6X4 10 (ENDOMECHANICALS) ×1
CHLORAPREP W/TINT 26ML (MISCELLANEOUS) ×3 IMPLANT
CLOTH BEACON ORANGE TIMEOUT ST (SAFETY) ×3 IMPLANT
COVER LIGHT HANDLE STERIS (MISCELLANEOUS) ×6 IMPLANT
CUTTER FLEX LINEAR 45M (STAPLE) ×2 IMPLANT
DECANTER SPIKE VIAL GLASS SM (MISCELLANEOUS) ×3 IMPLANT
ELECT REM PT RETURN 9FT ADLT (ELECTROSURGICAL) ×3
ELECTRODE REM PT RTRN 9FT ADLT (ELECTROSURGICAL) ×1 IMPLANT
FILTER SMOKE EVAC LAPAROSHD (FILTER) ×3 IMPLANT
FORMALIN 10 PREFIL 120ML (MISCELLANEOUS) ×3 IMPLANT
GLOVE BIOGEL PI IND STRL 7.0 (GLOVE) ×1 IMPLANT
GLOVE BIOGEL PI INDICATOR 7.0 (GLOVE) ×4
GLOVE SURG SS PI 7.5 STRL IVOR (GLOVE) ×3 IMPLANT
GOWN STRL REUS W/ TWL XL LVL3 (GOWN DISPOSABLE) ×1 IMPLANT
GOWN STRL REUS W/TWL LRG LVL3 (GOWN DISPOSABLE) ×6 IMPLANT
GOWN STRL REUS W/TWL XL LVL3 (GOWN DISPOSABLE) ×3
HEMOSTAT ARISTA ABSORB 3G PWDR (MISCELLANEOUS) ×2 IMPLANT
HEMOSTAT SNOW SURGICEL 2X4 (HEMOSTASIS) ×3 IMPLANT
INST SET LAPROSCOPIC AP (KITS) ×3 IMPLANT
IV NS IRRIG 3000ML ARTHROMATIC (IV SOLUTION) ×2 IMPLANT
KIT ROOM TURNOVER APOR (KITS) ×3 IMPLANT
MANIFOLD NEPTUNE II (INSTRUMENTS) ×3 IMPLANT
NDL INSUFFLATION 14GA 120MM (NEEDLE) ×1 IMPLANT
NEEDLE INSUFFLATION 14GA 120MM (NEEDLE) ×3 IMPLANT
NS IRRIG 1000ML POUR BTL (IV SOLUTION) ×3 IMPLANT
PACK LAP CHOLE LZT030E (CUSTOM PROCEDURE TRAY) ×3 IMPLANT
PAD ARMBOARD 7.5X6 YLW CONV (MISCELLANEOUS) ×3 IMPLANT
POUCH SPECIMEN RETRIEVAL 10MM (ENDOMECHANICALS) ×3 IMPLANT
RELOAD 45 VASCULAR/THIN (ENDOMECHANICALS) ×3 IMPLANT
RELOAD STAPLE 45 2.5 WHT GRN (ENDOMECHANICALS) IMPLANT
SET BASIN LINEN APH (SET/KITS/TRAYS/PACK) ×3 IMPLANT
SET TUBE IRRIG SUCTION NO TIP (IRRIGATION / IRRIGATOR) ×2 IMPLANT
SLEEVE ENDOPATH XCEL 5M (ENDOMECHANICALS) ×3 IMPLANT
SPONGE GAUZE 2X2 8PLY STER LF (GAUZE/BANDAGES/DRESSINGS) ×1
SPONGE GAUZE 2X2 8PLY STRL LF (GAUZE/BANDAGES/DRESSINGS) ×5 IMPLANT
STAPLER VISISTAT (STAPLE) ×3 IMPLANT
SUT VICRYL 0 UR6 27IN ABS (SUTURE) ×3 IMPLANT
TAPE CLOTH SURG 4X10 WHT LF (GAUZE/BANDAGES/DRESSINGS) ×2 IMPLANT
TROCAR ENDO BLADELESS 11MM (ENDOMECHANICALS) ×3 IMPLANT
TROCAR XCEL NON-BLD 5MMX100MML (ENDOMECHANICALS) ×3 IMPLANT
TROCAR XCEL UNIV SLVE 11M 100M (ENDOMECHANICALS) ×3 IMPLANT
TUBE CONNECTING 12'X1/4 (SUCTIONS) ×1
TUBE CONNECTING 12X1/4 (SUCTIONS) ×2 IMPLANT
TUBING INSUFFLATION (TUBING) ×3 IMPLANT
WARMER LAPAROSCOPE (MISCELLANEOUS) ×3 IMPLANT

## 2016-02-13 NOTE — Op Note (Signed)
Patient:  Christie Bruce  DOB:  06-02-1941  MRN:  [id number]   Preop Diagnosis:  Acute cholecystitis, cholelithiasis  Postop Diagnosis:  Same, gangrene of gallbladder  Procedure:  Laparoscopic cholecystectomy  Surgeon:  Aviva Signs, M.D.  Anes:  Gen. endotracheal  Indications:  Patient is a 75 year old white female who presents with worsening right upper quadrant abdominal pain. She was found to have acute cholecystitis secondary to cholelithiasis. The risks and benefits of the procedure including bleeding, infection, hepatobiliary injury, and the possibility of an open procedure were fully explained to the patient, who gave informed consent.  Procedure note:  The patient was placed the supine position. After induction of general endotracheal anesthesia, the abdomen was prepped and draped using usual sterile technique with DuraPrep. Surgical site confirmation was performed.  A supraumbilical incision was made down to the fascia. A Veress needle was introduced into the abdominal cavity and confirmation of placement was done using the saline drop test. The abdomen was then insufflated to 16 mmHg pressure. An 11 mm trocar was introduced into the abdominal cavity under direct visualization without difficulty. The patient was placed in reverse Trendelenburg position and an additional 11 mm trocar was placed the epigastric region and 5 mm trochars were placed the right upper quadrant and right flank regions. Liver was inspected and noted within normal limits. The gallbladder was noted to be distended with gangrenous changes on the wall. The gallbladder was decompressed in order to facilitate grasping of the gallbladder. The gallbladder was retracted in a dynamic fashion in order to provide a critical view of the triangle of [place]. The cystic duct was first identified. Its juncture to the infundibulum was fully identified. A vascular Endo GIA was placed across the cystic duct and fired. The cystic artery  was likewise ligated and divided using clips. The gallbladder was freed away from the gallbladder fossa using Bovie electrocautery. The gallbladder was delivered through the epigastric trocar site using an Endo Catch bag. The gallbladder fossa was inspected and any bleeding was controlled using Bovie electrocautery.  Arista powder and Surgicel was placed in the gallbladder fossa. All fluid and air were then evacuated from the abdominal cavity prior to removal of the trochars.  All wounds were irrigated with normal saline. All wounds were injected with 0.5% Sensorcaine. The supraumbilical fascia as well as epigastric fascia were reapproximated using 0 Vicryl interrupted sutures. All skin incisions were closed using staples. Betadine ointment and dry sterile dressings were applied.  All tape and needle counts were correct at the end of the procedure. Patient was extubated in the operating room and transferred to PACU in stable condition.  Complications:  None  EBL:  Less than 100 mL  Specimen:  Gallbladder

## 2016-02-13 NOTE — Progress Notes (Signed)
PROGRESS NOTE                                                                                                                                                                                                             Patient Demographics:    Christie Bruce, is a 75 y.o. female, DOB - Nov 30, 1940, [id number]  Admit date - 02/10/2016   Admitting Physician Orvan Falconer, MD  Outpatient Primary MD for the patient is Chevis Pretty, [place]  LOS - 2  Chief Complaint  Patient presents with  . Abdominal Pain       Brief Narrative    Christie Bruce is a 75 y.o. female with medical history significant of GERD, Essential Hypertension, GAD, CKD stage 3, Anemia, and breast cancer, presents to the ED with complaints of bilateral lower abdominal pain that radiated up to her chest into her right flank that onset around 1:30am this morning. She was evaluated Friday evening in an urgent care clinic and was noted to have a temperature of 99 and was advised to come to the ED. She reports that her pain is intermittent and last for about 20 to 30 minutes each episode. She reported that the pain is similar to when she had kidney stones 15 years ago. She reports associated nausea and has been vomiting 3 to 4 times a day for the past 2 days. Reports mild bloating and burping. She also noted a decrease in appetite 2 days ago as well. She denies diarrhea, dysuria, coughing, urinary frequency, and constipation. Pt reports falling often, Denies alcohol use. Denies DM or previous heart problems.  ED Course: What in the ED, Troponin <0.03, BUN 39, Creatinine 1.28, Glucose 172, WBC 14.6, Hgb 9.3, HCT 29.4, UA unremarkable. CT abdomen/pelvis shows acute cholecystitis. CXR unremarkable. EKG sinus rhythm. Hospitalist was asked to refer for admission for treatment of acute cholecystitis.    Subjective:    Christie Bruce today has, No headache, No chest pain,  Improved but +ve RUQ abdominal pain - No Nausea, No new weakness tingling or numbness, No Cough - SOB.     Assessment  & Plan :     1.Acute cholecystitis. Seen by general surgery, currently on clear liquids and Unasyn, per general surgery and continue supportive care with IV fluids and pain control, Right upper quadrant ultrasound confirms acute cholecystitis  with gallbladder neck stone, general surgery following, Due for cholecystectomy on 02/13/2016. Stable baseline EKG and two-view chest x-ray. No loud murmurs or chest pain or shortness of breath at rest.  2. GERD. On PPI.  3. Essential hypertension. Low-dose ACE inhibitor to continue.  4. Recent left intertrochanteric fracture. 6 Weeks ago. Status postsurgical repair. Weightbearing as tolerated, initiate PT.  5. Anemia of chronic disease worse with hemodilution due to IV fluids. No signs of active bleeding, per surgery 1 unit of packed RBC preop on 02/13/2016. We will give in combination with IV Lasix as she has mild rales on exam.  6. Hypokalemia. Replaced.    Family Communication  :  None present  Code Status :  Full  Diet : Clear liquids per surgery  Disposition Plan  : Remain inpatient  Consults  :  Gen. surgery  Procedures  :   CT abd Pelvis - Acute cholecystitis.  Right upper quadrant ultrasound - With obstructing stone at the gallbladder neck and suggestion of acute cholecystitis  DVT Prophylaxis  :   Heparin   Lab Results  Component Value Date   PLT 385 02/13/2016    Inpatient Medications  Scheduled Meds: . sodium chloride   Intravenous Once  . ampicillin-sulbactam (UNASYN) IV  1.5 g Intravenous Q8H  . enoxaparin (LOVENOX) injection  40 mg Subcutaneous Q24H  . furosemide  40 mg Intravenous Once  . lisinopril  10 mg Oral Daily  . pantoprazole  40 mg Oral Daily  . potassium chloride  40 mEq Oral Once   Continuous Infusions:   PRN Meds:.acetaminophen, ALPRAZolam, fentaNYL (SUBLIMAZE) injection,  [DISCONTINUED] ondansetron **OR** ondansetron (ZOFRAN) IV  Antibiotics  :    Anti-infectives    Start     Dose/Rate Route Frequency Ordered Stop   02/11/16 0930  ampicillin-sulbactam (UNASYN) 1.5 g in sodium chloride 0.9 % 50 mL IVPB     1.5 g 100 mL/hr over 30 Minutes Intravenous Every 8 hours 02/11/16 0257     02/11/16 0115  Ampicillin-Sulbactam (UNASYN) 3 g in sodium chloride 0.9 % 100 mL IVPB     3 g 100 mL/hr over 60 Minutes Intravenous  Once 02/11/16 0101 02/11/16 0221         Objective:   Vitals:   02/12/16 1300 02/12/16 2143 02/13/16 0641 02/13/16 0750  BP: 137/63 135/65 123/72 (!) (P) 124/53  Pulse: 95 98 (!) 104 (P) 82  Resp: 18 18 18  (P) 18  Temp: 99.7 F (37.6 C) 100 F (37.8 C) 99.7 F (37.6 C) (P) 98.9 F (37.2 C)  TempSrc: Other (Comment) Oral Oral (P) Oral  SpO2: 94% 92% 94% (P) 94%  Weight:      Height:        Wt Readings from Last 3 Encounters:  02/11/16 65 kg (143 lb 3.2 oz)  02/08/16 64.8 kg (142 lb 14.4 oz)  02/07/16 64.7 kg (142 lb 9.6 oz)     Intake/Output Summary (Last 24 hours) at 02/13/16 0810 Last data filed at 02/13/16 0750  Gross per 24 hour  Intake              950 ml  Output              550 ml  Net              400 ml     Physical Exam  Awake Alert, Oriented X 3, No new F.N deficits, Normal affect [place].AT,PERRAL Supple Neck,No JVD, No cervical lymphadenopathy appriciated.  Symmetrical Chest wall movement, Good air movement bilaterally, CTAB RRR,No Gallops,Rubs or new Murmurs, No Parasternal Heave +ve B.Sounds, Abd Soft, +ve RUQ tenderness, No organomegaly appriciated, No rebound - guarding or rigidity. No Cyanosis, Clubbing or edema, No new Rash or bruise      Data Review:    CBC  Recent Labs Lab 02/07/16 1643 02/10/16 2206 02/12/16 0605 02/13/16 0631  WBC 7.1 14.6* 18.5* 14.4*  HGB  --  9.3* 7.9* 7.8*  HCT 28.6* 29.4* 25.6* 25.1*  PLT 298 377 341 385  MCV 90 89.4 91.8 91.3  MCH 27.9 28.3 28.3 28.4  MCHC 31.1*  31.6 30.9 31.1  RDW 14.8 14.5 14.6 14.6  LYMPHSABS 2.5  --   --   --   EOSABS 0.2  --   --   --   BASOSABS 0.0  --   --   --     Chemistries   Recent Labs Lab 02/07/16 1643 02/10/16 2206 02/12/16 0605 02/13/16 0631  NA 141 137 138 143  K 4.4 4.1 3.5 3.2*  CL 98 103 108 111  CO2 22 22 22 22   GLUCOSE 94 172* 140* 106*  BUN 37* 39* 31* 28*  CREATININE 1.52* 1.28* 1.29* 1.29*  CALCIUM 9.1 9.3 8.2* 8.0*  AST 28 26 23 30   ALT 14 14 13* 14  ALKPHOS 145* 127* 110 130*  BILITOT 0.7 0.9 0.9 1.0   ------------------------------------------------------------------------------------------------------------------ No results for input(s): CHOL, HDL, LDLCALC, TRIG, CHOLHDL, LDLDIRECT in the last 72 hours.  No results found for: HGBA1C ------------------------------------------------------------------------------------------------------------------ No results for input(s): TSH, T4TOTAL, T3FREE, THYROIDAB in the last 72 hours.  Invalid input(s): FREET3 ------------------------------------------------------------------------------------------------------------------ No results for input(s): VITAMINB12, FOLATE, FERRITIN, TIBC, IRON, RETICCTPCT in the last 72 hours.  Coagulation profile  Recent Labs Lab 02/11/16 1722  INR 1.21    No results for input(s): DDIMER in the last 72 hours.  Cardiac Enzymes  Recent Labs Lab 02/10/16 2206  TROPONINI <0.03   ------------------------------------------------------------------------------------------------------------------ No results found for: BNP  Micro Results No results found for this or any previous visit (from the past 240 hour(s)).  Radiology Reports Dg Chest 2 View  Result Date: 02/10/2016 CLINICAL DATA:  Crackles.  Nausea, vomiting, and abdominal pain. EXAM: CHEST  2 VIEW COMPARISON:  12/27/2015 chest/rib radiographs. 05/13/2015 chest radiographs. FINDINGS: The cardiomediastinal silhouette is within normal limits. The patient  has taken a shallower inspiration than on the prior study. Chronically increased interstitial markings throughout both lungs are unchanged. A wheelchair partially obscures the posterior chest on the lateral radiograph. Within this limitation, no airspace consolidation, edema, pleural effusion, or pneumothorax is identified. Lower thoracic and upper lumbar compression fractures are unchanged. A calcified gallstone is again seen. IMPRESSION: No active cardiopulmonary disease. Electronically Signed   By: Logan Bores M.D.   On: 02/10/2016 21:50   Ct Renal Stone Study  Result Date: 02/11/2016 CLINICAL DATA:  Right flank and upper quadrant pain. EXAM: CT ABDOMEN AND PELVIS WITHOUT CONTRAST TECHNIQUE: Multidetector CT imaging of the abdomen and pelvis was performed following the standard protocol without IV contrast. COMPARISON:  None. FINDINGS: Lower chest and abdominal wall:  No contributory findings. Hepatobiliary: No focal liver abnormality.Over distended gallbladder with large stone in the neck. Extensive fat edema around the gallbladder fossa. Normal common bile duct diameter. Pancreas: Fatty atrophy of the pancreas. Spleen: Tiny incidental low densities in the central spleen. Adrenals/Urinary Tract: Negative adrenals. Bilateral renal atrophy with punctate lower pole stone on the right. No hydronephrosis or ureteral  calculus. Unremarkable bladder. Stomach/Bowel:  No obstruction. No appendicitis. Reproductive:Hysterectomy.  Negative adnexae.  Pessary in place. Vascular/Lymphatic: No acute vascular abnormality. Mild fat haziness in the small bowel mesentery, likely incidental mesenteric panniculitis in this setting. Other: Trace ascites considered reactive. Musculoskeletal: Remote posterior left eighth and ninth rib fractures that are chronic but without completed healing. Intertrochanteric left femur fracture repair without acute finding. Remote T10, T12, L2, and L4 superior endplate fractures. IMPRESSION: Acute  cholecystitis. Electronically Signed   By: Monte Fantasia M.D.   On: 02/11/2016 00:40   US Abdomen Limited Ruq  Result Date: 02/11/2016 CLINICAL DATA:  Cholelithiasis and acute cholecystitis as seen on CT imaging. EXAM: US ABDOMEN LIMITED - RIGHT UPPER QUADRANT COMPARISON:  February 11, 2016 FINDINGS: Gallbladder: There is a non mobile stone in the neck of the gallbladder resulting in wall thickening. Pericholecystic fluid was also seen on the CT scan but is not appreciated on this ultrasound. No [name]'s sign reported. The stone in the neck of the gallbladder measures 2.4 cm. Common bile duct: Diameter: 4 mm Liver: No focal lesion identified. Within normal limits in parenchymal echogenicity. IMPRESSION: There is a 2.4 cm stone lodged in the neck of the gallbladder resulting in wall thickening. On CT imaging, there was also gallbladder distention and pericholecystic fluid in addition to adjacent fat stranding. The combination of ultrasound and CT findings are consistent with acute cholecystitis. Electronically Signed   By: Dorise Bullion III M.D   On: 02/11/2016 11:43    Time Spent in minutes  30   Lala Lund K M.D on 02/13/2016 at 8:10 AM  Between 7am to 7pm - Pager - [phone]  After 7pm go to [email or web address] - password [facility]  [facility] -  Office  [phone]

## 2016-02-13 NOTE — Anesthesia Procedure Notes (Signed)
Procedure Name: Intubation Date/Time: 02/13/2016 10:56 AM Performed by: Charmaine Downs Pre-anesthesia Checklist: Emergency Drugs available, Patient identified, Suction available, Patient being monitored and Timeout performed Patient Re-evaluated:Patient Re-evaluated prior to inductionOxygen Delivery Method: Circle system utilized Preoxygenation: Pre-oxygenation with 100% oxygen Intubation Type: IV induction, Rapid sequence and Cricoid Pressure applied Laryngoscope Size: Mac and 3 Grade View: Grade I Tube type: Oral Tube size: 7.0 mm Number of attempts: 1 Airway Equipment and Method: Stylet Placement Confirmation: ETT inserted through vocal cords under direct vision,  positive ETCO2 and breath sounds checked- equal and bilateral Secured at: 20 cm Tube secured with: Tape Dental Injury: Teeth and Oropharynx as per pre-operative assessment

## 2016-02-13 NOTE — Transfer of Care (Signed)
Immediate Anesthesia Transfer of Care Note  Patient: Christie Bruce  Procedure(s) Performed: Procedure(s): LAPAROSCOPIC CHOLECYSTECTOMY (N/A)  Patient Location: PACU  Anesthesia Type:General  Level of Consciousness: awake and patient cooperative  Airway & Oxygen Therapy: Patient Spontanous Breathing and Patient connected to face mask oxygen  Post-op Assessment: Report given to RN and Post -op Vital signs reviewed and stable  Post vital signs: Reviewed and stable  Last Vitals:  Vitals:   02/13/16 0905 02/13/16 0946  BP: (!) 148/63 (!) 143/65  Pulse: 88 84  Resp: 18   Temp: 37.1 C     Last Pain:  Vitals:   02/13/16 1000  TempSrc:   PainSc: 0-No pain         Complications: No apparent anesthesia complications

## 2016-02-13 NOTE — Anesthesia Preprocedure Evaluation (Signed)
Anesthesia Evaluation  Patient identified by MRN, date of birth, ID band Patient awake    Reviewed: Allergy & Precautions, H&P , NPO status , Patient's Chart, lab work & pertinent test results  History of Anesthesia Complications (+) PONV and history of anesthetic complications  Airway Mallampati: II  TM Distance: >3 FB Neck ROM: Full    Dental  (+) Teeth Intact, Dental Advisory Given   Pulmonary asthma , former smoker,    breath sounds clear to auscultation       Cardiovascular hypertension, Pt. on medications  Rhythm:Regular Rate:Normal     Neuro/Psych PSYCHIATRIC DISORDERS negative neurological ROS     GI/Hepatic Neg liver ROS, GERD  Medicated and Controlled,  Endo/Other  negative endocrine ROS  Renal/GU Renal InsufficiencyRenal disease     Musculoskeletal   Abdominal   Peds  Hematology  (+) Blood dyscrasia, anemia ,   Anesthesia Other Findings Day of surgery medications reviewed with the patient.  DCIS s/p b/l mastectomy  Reproductive/Obstetrics                             Anesthesia Physical Anesthesia Plan  ASA: III  Anesthesia Plan: General   Post-op Pain Management:    Induction: Intravenous, Rapid sequence and Cricoid pressure planned  Airway Management Planned: Oral ETT  Additional Equipment:   Intra-op Plan:   Post-operative Plan: Extubation in OR  Informed Consent: I have reviewed the patients History and Physical, chart, labs and discussed the procedure including the risks, benefits and alternatives for the proposed anesthesia with the patient or authorized representative who has indicated his/her understanding and acceptance.     Plan Discussed with:   Anesthesia Plan Comments:         Anesthesia Quick Evaluation

## 2016-02-13 NOTE — Progress Notes (Signed)
Called Dr. Arnoldo Morale and notified him of patients Hgb and respiratory, and heart rate. Gave patient lopressor and and was instructed to place patient on telemetr upon arrival back to floor.

## 2016-02-13 NOTE — Progress Notes (Signed)
Patient sats staying around 90%. Anesthesia notified and is present. Breath sounds clear to ausculatation.

## 2016-02-13 NOTE — Anesthesia Postprocedure Evaluation (Signed)
Anesthesia Post Note  Patient: [name]  Procedure(s) Performed: Procedure(s) (LRB): LAPAROSCOPIC CHOLECYSTECTOMY (N/A)  Patient location during evaluation: PACU Anesthesia Type: General Level of consciousness: awake, oriented and patient cooperative Pain management: pain level controlled Vital Signs Assessment: post-procedure vital signs reviewed and stable Respiratory status: spontaneous breathing, nonlabored ventilation and respiratory function stable Cardiovascular status: blood pressure returned to baseline and stable Postop Assessment: no signs of nausea or vomiting Anesthetic complications: no    Last Vitals:  Vitals:   02/13/16 0946 02/13/16 1210  BP: (!) 143/65   Pulse: 84   Resp:    Temp:  36.8 C    Last Pain:  Vitals:   02/13/16 1000  TempSrc:   PainSc: 0-No pain                 Bleu Moisan J

## 2016-02-13 NOTE — Care Management Important Message (Signed)
Important Message  Patient Details  Name: Christie Bruce MRN: [id number] Date of Birth: [birth year]   Medicare Important Message Given:  Yes    Sherald Barge, RN 02/13/2016, 3:25 PM

## 2016-02-14 LAB — TYPE AND SCREEN
ABO/RH(D): O NEG
ANTIBODY SCREEN: NEGATIVE
UNIT DIVISION: 0
Unit division: 0

## 2016-02-14 LAB — BASIC METABOLIC PANEL
Anion gap: 9 (ref 5–15)
BUN: 25 mg/dL — AB (ref 6–20)
CALCIUM: 7.8 mg/dL — AB (ref 8.9–10.3)
CO2: 23 mmol/L (ref 22–32)
CREATININE: 1.28 mg/dL — AB (ref 0.44–1.00)
Chloride: 111 mmol/L (ref 101–111)
GFR calc non Af Amer: 40 mL/min — ABNORMAL LOW (ref >60)
GFR, EST AFRICAN AMERICAN: 46 mL/min — AB (ref >60)
Glucose, Bld: 109 mg/dL — ABNORMAL HIGH (ref 65–99)
Potassium: 2.8 mmol/L — ABNORMAL LOW (ref 3.5–5.1)
SODIUM: 143 mmol/L (ref 135–145)

## 2016-02-14 LAB — HEPATIC FUNCTION PANEL
ALT: 23 U/L (ref 14–54)
AST: 52 U/L — AB (ref 15–41)
Albumin: 2.3 g/dL — ABNORMAL LOW (ref 3.5–5.0)
Alkaline Phosphatase: 139 U/L — ABNORMAL HIGH (ref 38–126)
BILIRUBIN DIRECT: 0.3 mg/dL (ref 0.1–0.5)
BILIRUBIN INDIRECT: 0.9 mg/dL (ref 0.3–0.9)
BILIRUBIN TOTAL: 1.2 mg/dL (ref 0.3–1.2)
Total Protein: 6.1 g/dL — ABNORMAL LOW (ref 6.5–8.1)

## 2016-02-14 LAB — MAGNESIUM: MAGNESIUM: 1.5 mg/dL — AB (ref 1.7–2.4)

## 2016-02-14 LAB — CBC
HCT: 27.2 % — ABNORMAL LOW (ref 36.0–46.0)
Hemoglobin: 8.8 g/dL — ABNORMAL LOW (ref 12.0–15.0)
MCH: 28.9 pg (ref 26.0–34.0)
MCHC: 32.4 g/dL (ref 30.0–36.0)
MCV: 89.2 fL (ref 78.0–100.0)
Platelets: 339 10*3/uL (ref 150–400)
RBC: 3.05 MIL/uL — ABNORMAL LOW (ref 3.87–5.11)
RDW: 14.8 % (ref 11.5–15.5)
WBC: 9.8 10*3/uL (ref 4.0–10.5)

## 2016-02-14 LAB — POTASSIUM: POTASSIUM: 3.7 mmol/L (ref 3.5–5.1)

## 2016-02-14 MED ORDER — POTASSIUM CHLORIDE CRYS ER 20 MEQ PO TBCR
40.0000 meq | EXTENDED_RELEASE_TABLET | Freq: Once | ORAL | Status: AC
  Administered 2016-02-14: 40 meq via ORAL
  Filled 2016-02-14: qty 2

## 2016-02-14 MED ORDER — MAGNESIUM SULFATE IN D5W 1-5 GM/100ML-% IV SOLN
1.0000 g | Freq: Once | INTRAVENOUS | Status: AC
  Administered 2016-02-14: 1 g via INTRAVENOUS
  Filled 2016-02-14: qty 100

## 2016-02-14 MED ORDER — KCL IN DEXTROSE-NACL 40-5-0.45 MEQ/L-%-% IV SOLN
INTRAVENOUS | Status: DC
  Administered 2016-02-14 – 2016-02-15 (×2): via INTRAVENOUS

## 2016-02-14 MED ORDER — POTASSIUM CHLORIDE CRYS ER 20 MEQ PO TBCR: 40.0000 meq | EXTENDED_RELEASE_TABLET | Freq: Four times a day (QID) | ORAL | Status: DC

## 2016-02-14 MED ORDER — POTASSIUM CHLORIDE 10 MEQ/100ML IV SOLN
10.0000 meq | INTRAVENOUS | Status: AC
  Administered 2016-02-14 (×4): 10 meq via INTRAVENOUS
  Filled 2016-02-14: qty 100

## 2016-02-14 NOTE — Progress Notes (Signed)
1 Day Post-Op  Subjective: Mild incisional pain. No shortness of breath.  Objective: Vital signs in last 24 hours: Temp:  [98.1 F (36.7 C)-98.9 F (37.2 C)] 98.1 F (36.7 C) (07/25 0621) Pulse Rate:  [64-95] 64 (07/25 0621) Resp:  [18] 18 (07/25 0621) BP: (103-148)/(51-77) 118/77 (07/25 0621) SpO2:  [91 %-100 %] 100 % (07/25 0621) Weight:  [64.9 kg (143 lb)] 64.9 kg (143 lb) (07/24 0946)    Intake/Output from previous day: 07/24 0701 - 07/25 0700 In: 1433.8 [I.V.:1168.8; Blood:215; IV Piggyback:50] Out: 660 [Urine:650; Blood:10] Intake/Output this shift: No intake/output data recorded.  General appearance: alert, cooperative, appears stated age and no distress Resp: clear to auscultation bilaterally Cardio: regular rate and rhythm, S1, S2 normal, no murmur, click, rub or gallop GI: Soft, dressings dry and intact.  Lab Results:   Recent Labs  02/13/16 0631 02/13/16 1230 02/14/16 0539  WBC 14.4*  --  9.8  HGB 7.8* 10.3* 8.8*  HCT 25.1* 32.5* 27.2*  PLT 385  --  339   BMET  Recent Labs  02/13/16 0631 02/14/16 0539  NA 143 143  K 3.2* 2.8*  CL 111 111  CO2 22 23  GLUCOSE 106* 109*  BUN 28* 25*  CREATININE 1.29* 1.28*  CALCIUM 8.0* 7.8*   PT/INR  Recent Labs  02/11/16 1722  LABPROT 15.4*  INR 1.21    Studies/Results: No results found.  Anti-infectives: Anti-infectives    Start     Dose/Rate Route Frequency Ordered Stop   02/11/16 0930  ampicillin-sulbactam (UNASYN) 1.5 g in sodium chloride 0.9 % 50 mL IVPB     1.5 g 100 mL/hr over 30 Minutes Intravenous Every 8 hours 02/11/16 0257     02/11/16 0115  Ampicillin-Sulbactam (UNASYN) 3 g in sodium chloride 0.9 % 100 mL IVPB     3 g 100 mL/hr over 60 Minutes Intravenous  Once 02/11/16 0101 02/11/16 0221      Assessment/Plan: s/p Procedure(s): LAPAROSCOPIC CHOLECYSTECTOMY Impression: Stable on postoperative day 1. No significant tachycardia noted postoperatively. Hypokalemia noted. This has been  already addressed. We'll start ambulating patient. Anticipate discharge in next 24-48 hours.  LOS: 3 days    Virgilio Broadhead A 02/14/2016

## 2016-02-14 NOTE — Evaluation (Signed)
Physical Therapy Evaluation Patient Details Name: Christie Bruce MRN: [id number] DOB: [birth year] Today's Date: 02/14/2016   History of Present Illness  75 yo F admitted 02/10/2016 with c/o B lower abdominal pain that radiated up to her chest and into her R flank with nausea and vomiting.  Pt reports falling often.  CT of the abdomen/pelvis shows acute cholecystitis.  Pt is now s/p laparoscopic cholecystectomy on 02/13/2016.  Pt recently had a L hip IM nail, WBAT.  PMH: GERD, HTN, CKD stage 3, anemia, breast CA, heart murmur, B mastectomy, knee surgery, wrist surgery, L hip IM nail 12/28/2015  Clinical Impression  Pt received in bed, and was agreeable to PT evaluation.  Pt states that she lives with her son, but has someone from her family/friends at home with her 24/7.  She just finished her course of HHPT after her L hip fx and IM nail.  Today during PT, she required Min guard for sit<>stand transfer with RW, and ambulated 57ft with RW and Min guard.  She would benefit from HHPT upon d/c, and continued 24/7 supervision/assistance.      Follow Up Recommendations Home health PT;Supervision/Assistance - 24 hour    Equipment Recommendations  None recommended by PT    Recommendations for Other Services       Precautions / Restrictions Precautions Precautions: Fall Precaution Comments: Pt reports that she has had 2 falls at home since she left the [facility] Restrictions Weight Bearing Restrictions: No LLE Weight Bearing: Weight bearing as tolerated      Mobility  Bed Mobility Overal bed mobility: Needs Assistance Bed Mobility: Supine to Sit     Supine to sit: HOB elevated     General bed mobility comments: Increased time - pt reaching for PT, and PT instructed pt to use the bed and push herself up.   Transfers Overall transfer level: Needs assistance Equipment used: Rolling walker (2 wheeled) Transfers: Sit to/from Stand Sit to Stand: Min guard             Ambulation/Gait Ambulation/Gait assistance: Min guard Ambulation Distance (Feet): 60 Feet Assistive device: Rolling walker (2 wheeled) Gait Pattern/deviations: Step-to pattern;Step-through pattern   Gait velocity interpretation: <1.8 ft/sec, indicative of risk for recurrent falls General Gait Details: Pt expressed "Don't you let go of me, because I will fall if you let go." Pt reported having 2 falls after her rehab admission.   Stairs            Wheelchair Mobility    Modified Rankin (Stroke Patients Only)       Balance Overall balance assessment: Needs assistance Sitting-balance support: Bilateral upper extremity supported       Standing balance support: Bilateral upper extremity supported Standing balance-Leahy Scale: Fair                               Pertinent Vitals/Pain Pain Assessment: No/denies pain    Home Living   Living Arrangements: Children (Son) Available Help at Discharge: Family;Available PRN/intermittently;Home health (Pt states she just finished HHPT. [facility]) Type of Home: House Home Access: Stairs to enter Entrance Stairs-Rails: Can reach both Entrance Stairs-Number of Steps: 5 going in the front Home Layout: One level Home Equipment: Walker - 2 wheels (lift chair)      Prior Function     Gait / Transfers Assistance Needed: Pt ambulates with the RW all the time.  Pt states that  she also has to have someone with her when she is ambulating.  She has fallen 2x's when she has tried to get up by herself.    ADL's / Homemaking Assistance Needed: Pt was able to get herself dressed sometimes, other times she would have help.  Pt had assistance to get in the shower.         Hand Dominance   Dominant Hand: Right    Extremity/Trunk Assessment   Upper Extremity Assessment: Overall WFL for tasks assessed           Lower Extremity Assessment: Overall WFL for tasks assessed          Communication   Communication: No difficulties  Cognition Arousal/Alertness: Awake/alert Behavior During Therapy: WFL for tasks assessed/performed Overall Cognitive Status: Within Functional Limits for tasks assessed                      General Comments      Exercises        Assessment/Plan    PT Assessment Patient needs continued PT services  PT Diagnosis Difficulty walking;Abnormality of gait;Generalized weakness   PT Problem List Decreased strength;Decreased activity tolerance;Decreased range of motion;Decreased balance;Decreased mobility;Decreased knowledge of use of DME;Decreased safety awareness;Decreased knowledge of precautions  PT Treatment Interventions DME instruction;Gait training;Stair training;Functional mobility training;Therapeutic activities;Therapeutic exercise;Balance training;Patient/family education   PT Goals (Current goals can be found in the Care Plan section) Acute Rehab PT Goals Patient Stated Goal: Pt states she wants to get strong enough to go back home.  PT Goal Formulation: With patient Time For Goal Achievement: 02/21/16 Potential to Achieve Goals: Good    Frequency Min 3X/week   Barriers to discharge        Co-evaluation               End of Session Equipment Utilized During Treatment: Gait belt Activity Tolerance: Patient tolerated treatment well;No increased pain Patient left: in chair;with call bell/phone within reach;with nursing/sitter in room Nurse Communication: Mobility status    Functional Assessment Tool Used: [place] "6-clicks"  Functional Limitation: Mobility: Walking and moving around Mobility: Walking and Moving Around Current Status [phone]): At least 40 percent but less than 60 percent impaired, limited or restricted Mobility: Walking and Moving Around Goal Status [phone]): At least 20 percent but less than 40 percent impaired, limited or restricted    Time: 0945-1015 PT Time Calculation  (min) (ACUTE ONLY): 30 min   Charges:   PT Evaluation $PT Eval Low Complexity: 1 Procedure PT Treatments $Gait Training: 8-22 mins   PT G Codes:   PT G-Codes **NOT FOR INPATIENT CLASS** Functional Assessment Tool Used: [employer] "6-clicks"  Functional Limitation: Mobility: Walking and moving around Mobility: Walking and Moving Around Current Status [phone]): At least 40 percent but less than 60 percent impaired, limited or restricted Mobility: Walking and Moving Around Goal Status [phone]): At least 20 percent but less than 40 percent impaired, limited or restricted    Beth Nissan Frazzini, PT, DPT X: [phone]

## 2016-02-14 NOTE — Care Management Note (Signed)
Case Management Note  Patient Details  Name: Christie Bruce MRN: [id number] Date of Birth: June 02, 1941  Subjective/Objective:                  Pt admitted with acute cholecystitis. Pt is from home, lives with her son and has 24/7 supervision. Pt is active with [facility] prior to admission. Pt has RW that she uses for mobility. Pt plans ot return home with resumption of HH services through [facility]. Romualdo Bolk, of [facility], aware of admission and will obtain pt info from chart.   Action/Plan: Will cont to follow for DC planning.   Expected Discharge Date:    02/15/2016              Expected Discharge Plan:  [place]  In-House Referral:  NA  Discharge planning Services  CM Consult  Post Acute Care Choice:  Resumption of Svcs/PTA Provider, Home Health Choice offered to:  Patient  DME Arranged:    DME Agency:     HH Arranged:  RN, PT, Nurse's Aide [place] Agency:  [place]  Status of Service:  In process, will continue to follow  If discussed at Long Length of Stay Meetings, dates discussed:    Additional Comments:  Sherald Barge, RN 02/14/2016, 2:08 PM

## 2016-02-14 NOTE — Consult Note (Signed)
Spoke with patient at bedside regarding [facility] services. Patient does not want to participate with [facility] at this time.  Patient given [facility] brochure and contact information for future reference.  Of note, [facility] Care Management services would not replace or interfere with any services that are arranged by inpatient case management or social work. For additional questions or referrals please contact:  Royetta Crochet. Laymond Purser, RN, BSN, [facility] Liaison [phone]

## 2016-02-14 NOTE — Progress Notes (Signed)
Pharmacy Antibiotic Note  Christie Bruce is a 75 y.o. female admitted on 02/10/2016 with intra abdominal infection.  Pharmacy has been consulted for unasyn dosing.  WBC normalized, renal stable.   Plan: Continue unsyn 1.5 gm IV q8 hours F/u renal function, cultures and clinical course  Height: 5\' 2"  (157.5 cm) Weight: 143 lb (64.9 kg) IBW/kg (Calculated) : 50.1  Temp (24hrs), Avg:98.2 F (36.8 C), Min:98.1 F (36.7 C), Max:98.3 F (36.8 C)   Recent Labs Lab 02/07/16 1643 02/10/16 2206 02/12/16 0605 02/13/16 0631 02/14/16 0539  WBC 7.1 14.6* 18.5* 14.4* 9.8  CREATININE 1.52* 1.28* 1.29* 1.29* 1.28*    Estimated Creatinine Clearance: 33.6 mL/min (by C-G formula based on SCr of 1.28 mg/dL).    Allergies  Allergen Reactions  . Celebrex [Celecoxib]   . Sulfa Antibiotics     Had sores in my mouth, bottom of my feet "the skin just came right off"   Antimicrobials this admission: unasyn  7/22 >>   Thank you for allowing pharmacy to be a part of this patient's care.  Hart Robinsons A 02/14/2016 2:25 PM

## 2016-02-14 NOTE — Progress Notes (Addendum)
PROGRESS NOTE                                                                                                                                                                                                             Patient Demographics:    Christie Bruce, is a 75 y.o. female, DOB - [birth year], [id number]  Admit date - 02/10/2016   Admitting Physician Orvan Falconer, MD  Outpatient Primary MD for the patient is [place], [place]  LOS - 3  Chief Complaint  Patient presents with  . Abdominal Pain       Brief Narrative    Christie Bruce is a 75 y.o. female with medical history significant of GERD, Essential Hypertension, GAD, CKD stage 3, Anemia, and breast cancer, presents to the ED with complaints of bilateral lower abdominal pain that radiated up to her chest into her right flank that onset around 1:30am this morning. She was evaluated Friday evening in an urgent care clinic and was noted to have a temperature of 99 and was advised to come to the ED. She reports that her pain is intermittent and last for about 20 to 30 minutes each episode. She reported that the pain is similar to when she had kidney stones 15 years ago. She reports associated nausea and has been vomiting 3 to 4 times a day for the past 2 days. Reports mild bloating and burping. She also noted a decrease in appetite 2 days ago as well. She denies diarrhea, dysuria, coughing, urinary frequency, and constipation. Pt reports falling often, Denies alcohol use. Denies DM or previous heart problems.  She was admitted for acute cholecystitis, seen by general surgery underwent laparoscopic cholecystectomy on 02/13/2016. If stable will be discharged on 02/15/2016.   Subjective:    Christie Bruce today has, No headache, No chest pain, Improved but +ve RUQ abdominal pain - No Nausea, No new weakness tingling or numbness, No Cough - SOB.     Assessment  & Plan :     1.Acute cholecystitis. Seen by general surgery, currently on clear liquids and Unasyn, per general surgery and continue supportive care with IV fluids and pain control, Right upper quadrant ultrasound confirms acute cholecystitis with gallbladder neck stone, general surgery following, She is post laparoscopic cholecystectomy on 02/13/2016. Tolerated the procedure well, continue antibiotics for another 24 hours  as gallbladder appeared gangrenous, a stable might switch to oral antibiotic for a few days and discharged home on 02/15/2016.  2. GERD. On PPI.  3. Essential hypertension. Low-dose ACE inhibitor to continue.  4. Recent left intertrochanteric fracture. 6 Weeks ago. Status postsurgical repair. Weightbearing as tolerated, initiate PT.  5. Anemia of chronic disease worse with hemodilution due to IV fluids. No signs of active bleeding, per surgery 1 unit of packed RBC preop on 02/13/2016. Stable H&H.  6. Hypokalemia. Replaced.    Family Communication  :  None present  Code Status :  Full  Diet : Clear liquids per surgery  Disposition Plan  : likely discharge on 02/15/2016  Consults  :  Gen. surgery  Procedures  :   CT abd Pelvis - Acute cholecystitis.  Right upper quadrant ultrasound - With obstructing stone at the gallbladder neck and suggestion of acute cholecystitis  Laproscopic cholecystectomy by Dr. Arnoldo Morale on 02/13/2016   DVT Prophylaxis  :   Heparin   Lab Results  Component Value Date   PLT 339 02/14/2016    Inpatient Medications  Scheduled Meds: . ampicillin-sulbactam (UNASYN) IV  1.5 g Intravenous Q8H  . enoxaparin (LOVENOX) injection  40 mg Subcutaneous Q24H  . lisinopril  10 mg Oral Daily  . magnesium sulfate 1 - 4 g bolus IVPB  1 g Intravenous Once  . pantoprazole  40 mg Oral Daily  . potassium chloride  10 mEq Intravenous Q1 Hr x 4  . potassium chloride  40 mEq Oral Once   Continuous Infusions: . dextrose 5 % and 0.45 % NaCl with KCl 40 mEq/L      PRN Meds:.acetaminophen, albuterol, ALPRAZolam, hydrALAZINE, HYDROcodone-acetaminophen, metoprolol, morphine injection, [DISCONTINUED] ondansetron **OR** ondansetron (ZOFRAN) IV, simethicone  Antibiotics  :    Anti-infectives    Start     Dose/Rate Route Frequency Ordered Stop   02/11/16 0930  ampicillin-sulbactam (UNASYN) 1.5 g in sodium chloride 0.9 % 50 mL IVPB     1.5 g 100 mL/hr over 30 Minutes Intravenous Every 8 hours 02/11/16 0257     02/11/16 0115  Ampicillin-Sulbactam (UNASYN) 3 g in sodium chloride 0.9 % 100 mL IVPB     3 g 100 mL/hr over 60 Minutes Intravenous  Once 02/11/16 0101 02/11/16 0221         Objective:   Vitals:   02/13/16 1545 02/13/16 1645 02/13/16 2137 02/14/16 0621  BP: (!) 117/51 (!) 144/75 132/68 118/77  Pulse: 95 72 77 64  Resp:   18 18  Temp: 98.3 F (36.8 C)  98.2 F (36.8 C) 98.1 F (36.7 C)  TempSrc: Oral  Oral Oral  SpO2: 95% 100% 100% 100%  Weight:      Height:        Wt Readings from Last 3 Encounters:  02/13/16 64.9 kg (143 lb)  02/08/16 64.8 kg (142 lb 14.4 oz)  02/07/16 64.7 kg (142 lb 9.6 oz)     Intake/Output Summary (Last 24 hours) at 02/14/16 0834 Last data filed at 02/13/16 1922  Gross per 24 hour  Intake          1423.75 ml  Output              660 ml  Net           763.75 ml     Physical Exam  Awake Alert, Oriented X 3, No new F.N deficits, Normal affect [place].AT,PERRAL Supple Neck,No JVD, No cervical lymphadenopathy appriciated.  Symmetrical Chest  wall movement, Good air movement bilaterally, CTAB RRR,No Gallops,Rubs or new Murmurs, No Parasternal Heave +ve B.Sounds, Abd Soft, minimal RUQ tenderness, post op scars stable, No organomegaly appriciated, No rebound - guarding or rigidity. No Cyanosis, Clubbing or edema, No new Rash or bruise      Data Review:    CBC  Recent Labs Lab 02/07/16 1643 02/10/16 2206 02/12/16 0605 02/13/16 0631 02/13/16 1230 02/14/16 0539  WBC 7.1 14.6* 18.5* 14.4*  --  9.8   HGB  --  9.3* 7.9* 7.8* 10.3* 8.8*  HCT 28.6* 29.4* 25.6* 25.1* 32.5* 27.2*  PLT 298 377 341 385  --  339  MCV 90 89.4 91.8 91.3  --  89.2  MCH 27.9 28.3 28.3 28.4  --  28.9  MCHC 31.1* 31.6 30.9 31.1  --  32.4  RDW 14.8 14.5 14.6 14.6  --  14.8  LYMPHSABS 2.5  --   --   --   --   --   EOSABS 0.2  --   --   --   --   --   BASOSABS 0.0  --   --   --   --   --     Chemistries   Recent Labs Lab 02/07/16 1643 02/10/16 2206 02/12/16 0605 02/13/16 0631 02/14/16 0539  NA 141 137 138 143 143  K 4.4 4.1 3.5 3.2* 2.8*  CL 98 103 108 111 111  CO2 22 22 22 22 23   GLUCOSE 94 172* 140* 106* 109*  BUN 37* 39* 31* 28* 25*  CREATININE 1.52* 1.28* 1.29* 1.29* 1.28*  CALCIUM 9.1 9.3 8.2* 8.0* 7.8*  AST 28 26 23 30  52*  ALT 14 14 13* 14 23  ALKPHOS 145* 127* 110 130* 139*  BILITOT 0.7 0.9 0.9 1.0 1.2   ------------------------------------------------------------------------------------------------------------------ No results for input(s): CHOL, HDL, LDLCALC, TRIG, CHOLHDL, LDLDIRECT in the last 72 hours.  No results found for: HGBA1C ------------------------------------------------------------------------------------------------------------------ No results for input(s): TSH, T4TOTAL, T3FREE, THYROIDAB in the last 72 hours.  Invalid input(s): FREET3 ------------------------------------------------------------------------------------------------------------------ No results for input(s): VITAMINB12, FOLATE, FERRITIN, TIBC, IRON, RETICCTPCT in the last 72 hours.  Coagulation profile  Recent Labs Lab 02/11/16 1722  INR 1.21    No results for input(s): DDIMER in the last 72 hours.  Cardiac Enzymes  Recent Labs Lab 02/10/16 2206  TROPONINI <0.03   ------------------------------------------------------------------------------------------------------------------ No results found for: BNP  Micro Results No results found for this or any previous visit (from the past 240  hour(s)).  Radiology Reports Dg Chest 2 View  Result Date: 02/10/2016 CLINICAL DATA:  Crackles.  Nausea, vomiting, and abdominal pain. EXAM: CHEST  2 VIEW COMPARISON:  12/27/2015 chest/rib radiographs. 05/13/2015 chest radiographs. FINDINGS: The cardiomediastinal silhouette is within normal limits. The patient has taken a shallower inspiration than on the prior study. Chronically increased interstitial markings throughout both lungs are unchanged. A wheelchair partially obscures the posterior chest on the lateral radiograph. Within this limitation, no airspace consolidation, edema, pleural effusion, or pneumothorax is identified. Lower thoracic and upper lumbar compression fractures are unchanged. A calcified gallstone is again seen. IMPRESSION: No active cardiopulmonary disease. Electronically Signed   By: Logan Bores M.D.   On: 02/10/2016 21:50   Ct Renal Stone Study  Result Date: 02/11/2016 CLINICAL DATA:  Right flank and upper quadrant pain. EXAM: CT ABDOMEN AND PELVIS WITHOUT CONTRAST TECHNIQUE: Multidetector CT imaging of the abdomen and pelvis was performed following the standard protocol without IV contrast. COMPARISON:  None. FINDINGS: Lower chest  and abdominal wall:  No contributory findings. Hepatobiliary: No focal liver abnormality.Over distended gallbladder with large stone in the neck. Extensive fat edema around the gallbladder fossa. Normal common bile duct diameter. Pancreas: Fatty atrophy of the pancreas. Spleen: Tiny incidental low densities in the central spleen. Adrenals/Urinary Tract: Negative adrenals. Bilateral renal atrophy with punctate lower pole stone on the right. No hydronephrosis or ureteral calculus. Unremarkable bladder. Stomach/Bowel:  No obstruction. No appendicitis. Reproductive:Hysterectomy.  Negative adnexae.  Pessary in place. Vascular/Lymphatic: No acute vascular abnormality. Mild fat haziness in the small bowel mesentery, likely incidental mesenteric panniculitis in  this setting. Other: Trace ascites considered reactive. Musculoskeletal: Remote posterior left eighth and ninth rib fractures that are chronic but without completed healing. Intertrochanteric left femur fracture repair without acute finding. Remote T10, T12, L2, and L4 superior endplate fractures. IMPRESSION: Acute cholecystitis. Electronically Signed   By: Monte Fantasia M.D.   On: 02/11/2016 00:40   US Abdomen Limited Ruq  Result Date: 02/11/2016 CLINICAL DATA:  Cholelithiasis and acute cholecystitis as seen on CT imaging. EXAM: US ABDOMEN LIMITED - RIGHT UPPER QUADRANT COMPARISON:  February 11, 2016 FINDINGS: Gallbladder: There is a non mobile stone in the neck of the gallbladder resulting in wall thickening. Pericholecystic fluid was also seen on the CT scan but is not appreciated on this ultrasound. No [name]'s sign reported. The stone in the neck of the gallbladder measures 2.4 cm. Common bile duct: Diameter: 4 mm Liver: No focal lesion identified. Within normal limits in parenchymal echogenicity. IMPRESSION: There is a 2.4 cm stone lodged in the neck of the gallbladder resulting in wall thickening. On CT imaging, there was also gallbladder distention and pericholecystic fluid in addition to adjacent fat stranding. The combination of ultrasound and CT findings are consistent with acute cholecystitis. Electronically Signed   By: Dorise Bullion III M.D   On: 02/11/2016 11:43    Time Spent in minutes  30   Lala Lund K M.D on 02/14/2016 at 8:34 AM  Between 7am to 7pm - Pager - [phone]  After 7pm go to [email or web address] - password [facility]  [facility] -  Office  [phone]

## 2016-02-15 LAB — COMPREHENSIVE METABOLIC PANEL
ALBUMIN: 2.1 g/dL — AB (ref 3.5–5.0)
ALT: 26 U/L (ref 14–54)
ANION GAP: 9 (ref 5–15)
AST: 51 U/L — ABNORMAL HIGH (ref 15–41)
Alkaline Phosphatase: 167 U/L — ABNORMAL HIGH (ref 38–126)
BILIRUBIN TOTAL: 0.8 mg/dL (ref 0.3–1.2)
BUN: 16 mg/dL (ref 6–20)
CO2: 22 mmol/L (ref 22–32)
Calcium: 7.9 mg/dL — ABNORMAL LOW (ref 8.9–10.3)
Chloride: 110 mmol/L (ref 101–111)
Creatinine, Ser: 1.09 mg/dL — ABNORMAL HIGH (ref 0.44–1.00)
GFR calc Af Amer: 56 mL/min — ABNORMAL LOW (ref >60)
GFR calc non Af Amer: 48 mL/min — ABNORMAL LOW (ref >60)
GLUCOSE: 108 mg/dL — AB (ref 65–99)
POTASSIUM: 3.9 mmol/L (ref 3.5–5.1)
SODIUM: 141 mmol/L (ref 135–145)
TOTAL PROTEIN: 5.9 g/dL — AB (ref 6.5–8.1)

## 2016-02-15 LAB — CBC
HCT: 25.7 % — ABNORMAL LOW (ref 36.0–46.0)
HEMOGLOBIN: 8.3 g/dL — AB (ref 12.0–15.0)
MCH: 28.9 pg (ref 26.0–34.0)
MCHC: 32.3 g/dL (ref 30.0–36.0)
MCV: 89.5 fL (ref 78.0–100.0)
Platelets: 358 10*3/uL (ref 150–400)
RBC: 2.87 MIL/uL — AB (ref 3.87–5.11)
RDW: 14.9 % (ref 11.5–15.5)
WBC: 6.9 10*3/uL (ref 4.0–10.5)

## 2016-02-15 LAB — HEPATIC FUNCTION PANEL
ALBUMIN: 2.3 g/dL — AB (ref 3.5–5.0)
ALK PHOS: 174 U/L — AB (ref 38–126)
ALT: 26 U/L (ref 14–54)
AST: 56 U/L — AB (ref 15–41)
BILIRUBIN INDIRECT: 0.6 mg/dL (ref 0.3–0.9)
Bilirubin, Direct: 0.3 mg/dL (ref 0.1–0.5)
TOTAL PROTEIN: 6 g/dL — AB (ref 6.5–8.1)
Total Bilirubin: 0.9 mg/dL (ref 0.3–1.2)

## 2016-02-15 LAB — MAGNESIUM: Magnesium: 1.4 mg/dL — ABNORMAL LOW (ref 1.7–2.4)

## 2016-02-15 MED ORDER — CIPROFLOXACIN HCL 500 MG PO TABS: 500.0000 mg | ORAL_TABLET | Freq: Two times a day (BID) | ORAL | 0 refills | Status: DC

## 2016-02-15 MED ORDER — HYDROCODONE-ACETAMINOPHEN 5-325 MG PO TABS: 1.0000 | ORAL_TABLET | Freq: Four times a day (QID) | ORAL | 0 refills | Status: DC | PRN

## 2016-02-15 NOTE — Discharge Instructions (Signed)
Laparoscopic Cholecystectomy, Care After °Refer to this sheet in the next few weeks. These instructions provide you with information about caring for yourself after your procedure. Your health care provider may also give you more specific instructions. Your treatment has been planned according to current medical practices, but problems sometimes occur. Call your health care provider if you have any problems or questions after your procedure. °WHAT TO EXPECT AFTER THE PROCEDURE °After your procedure, it is common to have: °· Pain at your incision sites. You will be given pain medicines to control your pain. °· Mild nausea or vomiting. This should improve after the first 24 hours. °· Bloating and possible shoulder pain from the gas that was used during the procedure. This will improve after the first 24 hours. °HOME CARE INSTRUCTIONS °Incision Care °· Follow instructions from your health care provider about how to take care of your incisions. Make sure you: °¨ Wash your hands with soap and water before you change your bandage (dressing). If soap and water are not available, use hand sanitizer. °¨ Change your dressing as told by your health care provider. °¨ Leave stitches (sutures), skin glue, or adhesive strips in place. These skin closures may need to be in place for 2 weeks or longer. If adhesive strip edges start to loosen and curl up, you may trim the loose edges. Do not remove adhesive strips completely unless your health care provider tells you to do that. °· Do not take baths, swim, or use a hot tub until your health care provider approves. Ask your health care provider if you can take showers. You may only be allowed to take sponge baths for bathing. °General Instructions °· Take over-the-counter and prescription medicines only as told by your health care provider. °· Do not drive or operate heavy machinery while taking prescription pain medicine. °· Return to your normal diet as told by your health care  provider. °· Do not lift anything that is heavier than 10 lb (4.5 kg). °· Do not play contact sports for one week or until your health care provider approves. °SEEK MEDICAL CARE IF:  °· You have redness, swelling, or pain at the site of your incision. °· You have fluid, blood, or pus coming from your incision. °· You notice a bad smell coming from your incision area. °· Your surgical incisions break open. °· You have a fever. °SEEK IMMEDIATE MEDICAL CARE IF: °· You develop a rash. °· You have difficulty breathing. °· You have chest pain. °· You have increasing pain in your shoulders (shoulder strap areas). °· You faint or have dizzy episodes while you are standing. °· You have severe pain in your abdomen. °· You have nausea or vomiting that lasts for more than one day. °  °This information is not intended to replace advice given to you by your health care provider. Make sure you discuss any questions you have with your health care provider. °  °Document Released: 07/09/2005 Document Revised: 03/30/2015 Document Reviewed: 02/18/2013 °Elsevier Interactive Patient Education ©2016 Elsevier Inc. ° °

## 2016-02-15 NOTE — Discharge Summary (Signed)
Physician Discharge Summary  Patient ID: Christie Bruce MRN: [id number] DOB/AGE: 09/27/1940 75 y.o.  Admit date: 02/10/2016 Discharge date: 02/15/2016  Admission Diagnoses:Acute cholecystitis, cholelithiasis  Discharge Diagnoses: Same, anemia of chronic disease Principal Problem:   Acute cholecystitis Active Problems:   GERD (gastroesophageal reflux disease)   Essential hypertension   CKD (chronic kidney disease), stage III   Discharged Condition: good  Hospital Course: Patient is a 75 year old white female who presented emergency room with worsening right upper quadrant abdominal pain. She was admitted to the hospital for further evaluation treatment. Ultrasound confirmed cholelithiasis with cholecystitis. She did have a leukocytosis. Surgery was consulted. The patient subsequent underwent laparoscopic cholecystectomy on 02/13/2016. Gangrene of the gallbladder was found. She tolerated the procedure well. Her postoperative course has been for the most part unremarkable. She did have hypokalemia which was treated. She is noted to be still anemic after the surgery, but does not require a blood transfusion postoperatively. She did receive one unit of packed red cells preoperatively. Her liver enzyme tests have normalized. She is being discharged home in good and improving condition.  Treatments: surgery: Laparoscopic cholecystectomy on 02/13/2016  Discharge Exam: Blood pressure 130/68, pulse 90, temperature 98.1 F (36.7 C), temperature source Oral, resp. rate 19, height 5\' 2"  (1.575 m), weight 64.9 kg (143 lb), SpO2 97 %. General appearance: alert, cooperative, appears stated age and no distress Resp: clear to auscultation bilaterally Cardio: regular rate and rhythm, S1, S2 normal, no murmur, click, rub or gallop GI: Soft, incisions healing well.  Disposition: home    Medication List    TAKE these medications   acetaminophen 325 MG tablet Commonly known as:  TYLENOL Take 650 mg by  mouth every 6 (six) hours as needed for mild pain or moderate pain.   ALPRAZolam 0.25 MG tablet Commonly known as:  XANAX Take 1 tablet (0.25 mg total) by mouth 2 (two) times daily as needed for anxiety.   cetirizine 10 MG tablet Commonly known as:  ZYRTEC Take 1 tablet (10 mg total) by mouth daily as needed for allergies or rhinitis.   chlorthalidone 25 MG tablet Commonly known as:  HYGROTON Take 1 tablet (25 mg total) by mouth daily. What changed:  when to take this  reasons to take this   ciprofloxacin 500 MG tablet Commonly known as:  CIPRO Take 1 tablet (500 mg total) by mouth 2 (two) times daily.   docusate sodium 100 MG capsule Commonly known as:  COLACE Take 1 capsule (100 mg total) by mouth 2 (two) times daily.   fluticasone 50 MCG/ACT nasal spray Commonly known as:  FLONASE Place 2 sprays into both nostrils daily.   HYDROcodone-acetaminophen 5-325 MG tablet Commonly known as:  NORCO/VICODIN Take 1 tablet by mouth every 6 (six) hours as needed for moderate pain. What changed:  when to take this   lisinopril 10 MG tablet Commonly known as:  PRINIVIL,ZESTRIL Take 1 tablet (10 mg total) by mouth daily.   metroNIDAZOLE 500 MG tablet Commonly known as:  FLAGYL Take 1 tablet (500 mg total) by mouth 3 (three) times daily.   omeprazole 40 MG capsule Commonly known as:  PRILOSEC Take 1 capsule (40 mg total) by mouth daily.   traMADol 50 MG tablet Commonly known as:  ULTRAM Take 0.5-1 tablets (25-50 mg total) by mouth every 8 (eight) hours as needed.      Follow-up Information    Jamesetta So, MD. Schedule an appointment as soon as possible for a visit on  02/28/2016.   Specialty:  General Surgery Contact information: [id number] [place] [id number] [phone]           Signed: Aviva Signs A 02/15/2016, 9:00 AM

## 2016-02-15 NOTE — Care Management Note (Signed)
Case Management Note  Patient Details  Name: VIERA STEINLE MRN: [id number] Date of Birth: 05-Mar-1941   Expected Discharge Date:    02/15/2016              Expected Discharge Plan:  [place]  In-House Referral:  NA  Discharge planning Services  CM Consult  Post Acute Care Choice:  Resumption of Svcs/PTA Provider, Home Health Choice offered to:  Patient  DME Arranged:    DME Agency:     HH Arranged:  RN, PT, Nurse's Aide [place] Agency:  [facility]  Status of Service:  Completed, signed off  If discussed at [place] of Stay Meetings, dates discussed:    Additional Comments: Pt discharging home with resumption of [facility] services. Pt is aware HH has 48 hours to resume services. Romualdo Bolk, of [facility], made aware of DC today and will obtain pt info from chart.   Sherald Barge, RN 02/15/2016, 10:25 AM

## 2016-02-15 NOTE — Progress Notes (Signed)
Discharge instructions and prescriptions given, verbalized understanding, out in stable condition via w/c.

## 2016-02-16 DIAGNOSIS — R42 Dizziness and giddiness: Secondary | ICD-10-CM | POA: Diagnosis not present

## 2016-02-16 DIAGNOSIS — K219 Gastro-esophageal reflux disease without esophagitis: Secondary | ICD-10-CM | POA: Diagnosis not present

## 2016-02-16 DIAGNOSIS — I129 Hypertensive chronic kidney disease with stage 1 through stage 4 chronic kidney disease, or unspecified chronic kidney disease: Secondary | ICD-10-CM | POA: Diagnosis not present

## 2016-02-16 DIAGNOSIS — S72142D Displaced intertrochanteric fracture of left femur, subsequent encounter for closed fracture with routine healing: Secondary | ICD-10-CM | POA: Diagnosis not present

## 2016-02-16 DIAGNOSIS — N183 Chronic kidney disease, stage 3 (moderate): Secondary | ICD-10-CM | POA: Diagnosis not present

## 2016-02-17 ENCOUNTER — Encounter: Payer: Self-pay | Admitting: Family Medicine

## 2016-02-17 ENCOUNTER — Encounter (HOSPITAL_COMMUNITY): Payer: Self-pay | Admitting: General Surgery

## 2016-02-19 ENCOUNTER — Encounter (HOSPITAL_COMMUNITY): Payer: Self-pay | Admitting: Emergency Medicine

## 2016-02-19 ENCOUNTER — Emergency Department (HOSPITAL_COMMUNITY)
Admission: EM | Admit: 2016-02-19 | Discharge: 2016-02-19 | Disposition: A | Discharge status: Home / Self Care | Payer: Medicare Other | Attending: Emergency Medicine | Admitting: Emergency Medicine

## 2016-02-19 DIAGNOSIS — R112 Nausea with vomiting, unspecified: Secondary | ICD-10-CM | POA: Diagnosis not present

## 2016-02-19 DIAGNOSIS — Z853 Personal history of malignant neoplasm of breast: Secondary | ICD-10-CM | POA: Diagnosis not present

## 2016-02-19 DIAGNOSIS — G629 Polyneuropathy, unspecified: Secondary | ICD-10-CM | POA: Diagnosis not present

## 2016-02-19 DIAGNOSIS — I129 Hypertensive chronic kidney disease with stage 1 through stage 4 chronic kidney disease, or unspecified chronic kidney disease: Secondary | ICD-10-CM | POA: Diagnosis not present

## 2016-02-19 DIAGNOSIS — J45909 Unspecified asthma, uncomplicated: Secondary | ICD-10-CM | POA: Insufficient documentation

## 2016-02-19 DIAGNOSIS — Z87891 Personal history of nicotine dependence: Secondary | ICD-10-CM | POA: Diagnosis not present

## 2016-02-19 DIAGNOSIS — N183 Chronic kidney disease, stage 3 (moderate): Secondary | ICD-10-CM | POA: Insufficient documentation

## 2016-02-19 DIAGNOSIS — E876 Hypokalemia: Secondary | ICD-10-CM | POA: Insufficient documentation

## 2016-02-19 DIAGNOSIS — Z79899 Other long term (current) drug therapy: Secondary | ICD-10-CM | POA: Diagnosis not present

## 2016-02-19 LAB — CBC
HCT: 29.5 % — ABNORMAL LOW (ref 36.0–46.0)
Hemoglobin: 9.5 g/dL — ABNORMAL LOW (ref 12.0–15.0)
MCH: 28.4 pg (ref 26.0–34.0)
MCHC: 32.2 g/dL (ref 30.0–36.0)
MCV: 88.1 fL (ref 78.0–100.0)
Platelets: 425 10*3/uL — ABNORMAL HIGH (ref 150–400)
RBC: 3.35 MIL/uL — ABNORMAL LOW (ref 3.87–5.11)
RDW: 15.3 % (ref 11.5–15.5)
WBC: 7.5 10*3/uL (ref 4.0–10.5)

## 2016-02-19 LAB — COMPREHENSIVE METABOLIC PANEL
ALT: 24 U/L (ref 14–54)
AST: 42 U/L — ABNORMAL HIGH (ref 15–41)
Albumin: 2.8 g/dL — ABNORMAL LOW (ref 3.5–5.0)
Alkaline Phosphatase: 134 U/L — ABNORMAL HIGH (ref 38–126)
Anion gap: 8 (ref 5–15)
BUN: 10 mg/dL (ref 6–20)
CO2: 23 mmol/L (ref 22–32)
Calcium: 7.8 mg/dL — ABNORMAL LOW (ref 8.9–10.3)
Chloride: 105 mmol/L (ref 101–111)
Creatinine, Ser: 0.95 mg/dL (ref 0.44–1.00)
GFR calc Af Amer: >60 mL/min (ref >60)
GFR calc non Af Amer: 57 mL/min — ABNORMAL LOW (ref >60)
Glucose, Bld: 123 mg/dL — ABNORMAL HIGH (ref 65–99)
Potassium: 3 mmol/L — ABNORMAL LOW (ref 3.5–5.1)
Sodium: 136 mmol/L (ref 135–145)
Total Bilirubin: 0.7 mg/dL (ref 0.3–1.2)
Total Protein: 7.3 g/dL (ref 6.5–8.1)

## 2016-02-19 LAB — BRAIN NATRIURETIC PEPTIDE: B Natriuretic Peptide: 178 pg/mL — ABNORMAL HIGH (ref 0.0–100.0)

## 2016-02-19 LAB — LIPASE, BLOOD: Lipase: 32 U/L (ref 11–51)

## 2016-02-19 MED ORDER — GABAPENTIN 100 MG PO CAPS: 100.0000 mg | ORAL_CAPSULE | Freq: Three times a day (TID) | ORAL | 0 refills | Status: DC

## 2016-02-19 MED ORDER — POTASSIUM CHLORIDE CRYS ER 20 MEQ PO TBCR
40.0000 meq | EXTENDED_RELEASE_TABLET | Freq: Once | ORAL | Status: AC
  Administered 2016-02-19: 40 meq via ORAL
  Filled 2016-02-19: qty 2

## 2016-02-19 NOTE — ED Notes (Signed)
MD at bedside. 

## 2016-02-19 NOTE — ED Notes (Signed)
Pt denies SOB or CP. . 

## 2016-02-19 NOTE — ED Triage Notes (Signed)
Family states pt had gallbladder removed a week ago and today was running a temp of 100.9 with vomiting after taking medication.  Called doctor and was told to come for eval.  Pt also c/o pain and swelling in feet and legs since surgery.

## 2016-02-19 NOTE — ED Provider Notes (Signed)
[facility] DEPT Provider Note   CSN: [id number] Arrival date & time: 02/19/16  1751   First MD Initiated Contact with Patient 02/19/16 2119      By signing my name below, I, Altamease Oiler, attest that this documentation has been prepared under the direction and in the presence of Virgel Manifold, MD. Electronically Signed: Altamease Oiler, ED Scribe. 02/19/16. 9:18 PM   History   Chief Complaint Chief Complaint  Patient presents with  . Emesis    HPI  The history is provided by the patient. No language interpreter was used.   Christie Bruce is a 75 y.o. female 6 days s/p cholecystectomy  who presents to the Emergency Department complaining of nausea and vomiting with onset 4 days ago. Pt states that every time she attempts to swallow her medication she gets sick. Pt states that she takes 7 pills. Including ciprofloxacin since surgery,  and has tried spacing out the medication but she still brings them back up. She is able to tolerate food. Associated symptoms include fever up to 100.9 today. Pt denies difficulty urinating and constipation. The pain associated with her surgery has improved. Pt a;so complains of burning pain and swelling in her lower legs and feet that is not new.   Past Medical History:  Diagnosis Date  . Asthma    small issue and uses inhaler if needed  . Breast cancer ([place])   . Chronic kidney disease   . 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 GERD (gastroesophageal reflux disease)   . GERD (gastroesophageal reflux disease) 09/12/2011  . Heart murmur   . Hypertension   . PONV (postoperative nausea and vomiting)   . Uterine prolapse     Patient Active Problem List   Diagnosis Date Noted  . Acute cholecystitis 02/11/2016  . Hip fracture, left ([place])   . Hip fracture ([place]) 12/28/2015  . Femur fracture, left ([place]) 12/28/2015  . Anemia 12/28/2015  . CKD (chronic kidney disease), stage III 12/28/2015  . CKD (chronic kidney disease) stage  3, GFR 30-59 ml/min 05/16/2015  . BMI 28.0-28.9,adult 05/13/2015  . Essential hypertension 09/03/2014  . GAD (generalized anxiety disorder) 09/03/2014  . Intrinsic asthma 12/22/2013  . Uterovaginal prolapse, complete 03/31/2013  . 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 GERD (gastroesophageal reflux disease) 09/12/2011    Past Surgical History:  Procedure Laterality Date  . bilateral mastectomy  07/19/10   bilat mastectomy for DCIS  . CHOLECYSTECTOMY N/A 02/13/2016   Procedure: LAPAROSCOPIC CHOLECYSTECTOMY;  Surgeon: Aviva Signs, MD;  Location: [facility];  Service: General;  Laterality: N/A;  . INTRAMEDULLARY (IM) NAIL INTERTROCHANTERIC Left 12/28/2015   Procedure: INTRAMEDULLARY (IM) NAIL LEFT HIP;  Surgeon: Renette Butters, MD;  Location: [facility];  Service: Orthopedics;  Laterality: Left;  . KNEE SURGERY Left   . WRIST SURGERY Left     OB History    Gravida Para Term Preterm AB Living   3 3       3    SAB TAB Ectopic Multiple Live Births           3       Home Medications    Prior to Admission medications   Medication Sig Start Date End Date Taking? Authorizing Provider  acetaminophen (TYLENOL) 325 MG tablet Take 650 mg by mouth every 6 (six) hours as needed for mild pain or moderate pain.    Historical Provider, MD  ALPRAZolam Duanne Moron) 0.25 MG tablet Take 1  tablet (0.25 mg total) by mouth 2 (two) times daily as needed for anxiety. 12/30/15   Clanford Marisa Hua, MD  cetirizine (ZYRTEC) 10 MG tablet Take 1 tablet (10 mg total) by mouth daily as needed for allergies or rhinitis. 12/30/15   Clanford Marisa Hua, MD  chlorthalidone (HYGROTON) 25 MG tablet Take 1 tablet (25 mg total) by mouth daily. Patient taking differently: Take 25 mg by mouth daily as needed (for fluid).  01/26/16   Wardell Honour, MD  ciprofloxacin (CIPRO) 500 MG tablet Take 1 tablet (500 mg total) by mouth 2 (two) times daily. 02/15/16   Aviva Signs, MD  docusate sodium (COLACE) 100 MG capsule Take 1  capsule (100 mg total) by mouth 2 (two) times daily. Patient not taking: Reported on 02/10/2016 12/30/15   Clanford Marisa Hua, MD  fluticasone (FLONASE) 50 MCG/ACT nasal spray Place 2 sprays into both nostrils daily. 12/08/15   Sharion Balloon, FNP  HYDROcodone-acetaminophen (NORCO/VICODIN) 5-325 MG tablet Take 1 tablet by mouth every 6 (six) hours as needed for moderate pain. 02/15/16   Aviva Signs, MD  lisinopril (PRINIVIL,ZESTRIL) 10 MG tablet Take 1 tablet (10 mg total) by mouth daily. 02/07/16   Timmothy Euler, MD  metroNIDAZOLE (FLAGYL) 500 MG tablet Take 1 tablet (500 mg total) by mouth 3 (three) times daily. 02/08/16   Florian Buff, MD  omeprazole (PRILOSEC) 40 MG capsule Take 1 capsule (40 mg total) by mouth daily. 12/30/15   Clanford Marisa Hua, MD  traMADol (ULTRAM) 50 MG tablet Take 0.5-1 tablets (25-50 mg total) by mouth every 8 (eight) hours as needed. 02/07/16   Timmothy Euler, MD    Family History Family History  Problem Relation Age of Onset  . Cancer Mother     melanoma  . Stroke Mother   . Cancer Father     prostate cancer  . Heart disease Father   . Hypertension Father   . Cancer Brother     lung cancer  . Hypertension Sister   . Heart attack Brother   . Diabetes Brother   . Diabetes Brother   . Cancer Paternal Uncle     lung cancer    Social History Social History  Substance Use Topics  . Smoking status: Former Smoker    Years: 15.00    Types: Cigarettes  . Smokeless tobacco: Never Used  . Alcohol use No     Allergies   Celebrex [celecoxib] and Sulfa antibiotics   Review of Systems Review of Systems  Constitutional: Positive for fever.  Gastrointestinal: Positive for nausea and vomiting. Negative for constipation.  Genitourinary: Negative for difficulty urinating.  All other systems reviewed and are negative.    Physical Exam Updated Vital Signs BP 148/71 (BP Location: Left Arm)   Pulse 101   Temp 98.8 F (37.1 C) (Oral)   Resp 16   Ht 5'  1" (1.549 m)   Wt 145 lb (65.8 kg)   SpO2 96%   BMI 27.40 kg/m   Physical Exam  Constitutional: She is oriented to person, place, and time. She appears well-developed and well-nourished. No distress.  HENT:  Head: Normocephalic and atraumatic.  Eyes: EOM are normal.  Neck: Normal range of motion.  Cardiovascular: Normal rate, regular rhythm and normal heart sounds.   Pulmonary/Chest: Effort normal and breath sounds normal.  Abdominal: Soft. She exhibits no distension. There is no tenderness.  Stapled incisions appear to be healing well.   Musculoskeletal: Normal range of motion.  She exhibits edema.  Symmetric lower extremity edema   Neurological: She is alert and oriented to person, place, and time.  Skin: Skin is warm and dry.  Psychiatric: She has a normal mood and affect. Judgment normal.  Nursing note and vitals reviewed.    ED Treatments / Results  Labs (all labs ordered are listed, but only abnormal results are displayed) Labs Reviewed  COMPREHENSIVE METABOLIC PANEL - Abnormal; Notable for the following:       Result Value   Potassium 3.0 (*)    Glucose, Bld 123 (*)    Calcium 7.8 (*)    Albumin 2.8 (*)    AST 42 (*)    Alkaline Phosphatase 134 (*)    GFR calc non Af Amer 57 (*)    All other components within normal limits  CBC - Abnormal; Notable for the following:    RBC 3.35 (*)    Hemoglobin 9.5 (*)    HCT 29.5 (*)    Platelets 425 (*)    All other components within normal limits  BRAIN NATRIURETIC PEPTIDE - Abnormal; Notable for the following:    B Natriuretic Peptide 178.0 (*)    All other components within normal limits  LIPASE, BLOOD    EKG  EKG Interpretation None       Radiology No results found.  Procedures Procedures (including critical care time)  Medications Ordered in ED Medications - No data to display   Initial Impression / Assessment and Plan / ED Course  I have reviewed the triage vital signs and the nursing  notes.  Pertinent lab results that were available during my care of the patient were reviewed by me and considered in my medical decision making (see chart for details).  Clinical Course      Final Clinical Impressions(s) / ED Diagnoses   Final diagnoses:  Hypokalemia  Neuropathy ([place])    New Prescriptions New Prescriptions   No medications on file    I personally preformed the services scribed in my presence. The recorded information has been reviewed is accurate. Virgel Manifold, MD.     Virgel Manifold, MD 02/27/16 1239

## 2016-02-19 NOTE — ED Notes (Signed)
Pt with emesis x 1 a day since taking an antibiotic since gallbladder surgery a week ago.  Also c/o swelling to ankles and tenderness

## 2016-02-22 DIAGNOSIS — I129 Hypertensive chronic kidney disease with stage 1 through stage 4 chronic kidney disease, or unspecified chronic kidney disease: Secondary | ICD-10-CM | POA: Diagnosis not present

## 2016-02-22 DIAGNOSIS — K219 Gastro-esophageal reflux disease without esophagitis: Secondary | ICD-10-CM | POA: Diagnosis not present

## 2016-02-22 DIAGNOSIS — N183 Chronic kidney disease, stage 3 (moderate): Secondary | ICD-10-CM | POA: Diagnosis not present

## 2016-02-22 DIAGNOSIS — S72142D Displaced intertrochanteric fracture of left femur, subsequent encounter for closed fracture with routine healing: Secondary | ICD-10-CM | POA: Diagnosis not present

## 2016-02-22 DIAGNOSIS — R42 Dizziness and giddiness: Secondary | ICD-10-CM | POA: Diagnosis not present

## 2016-03-05 ENCOUNTER — Other Ambulatory Visit: Payer: Medicare Other

## 2016-03-05 ENCOUNTER — Telehealth: Payer: Self-pay

## 2016-03-05 DIAGNOSIS — R300 Dysuria: Secondary | ICD-10-CM | POA: Diagnosis not present

## 2016-03-05 DIAGNOSIS — M25571 Pain in right ankle and joints of right foot: Secondary | ICD-10-CM | POA: Diagnosis not present

## 2016-03-05 DIAGNOSIS — S72142D Displaced intertrochanteric fracture of left femur, subsequent encounter for closed fracture with routine healing: Secondary | ICD-10-CM | POA: Diagnosis not present

## 2016-03-05 LAB — URINALYSIS, COMPLETE
BILIRUBIN UA: NEGATIVE
Glucose, UA: NEGATIVE
KETONES UA: NEGATIVE
Nitrite, UA: NEGATIVE
SPEC GRAV UA: 1.025 (ref 1.005–1.030)
Urobilinogen, Ur: 0.2 mg/dL (ref 0.2–1.0)
pH, UA: 5.5 (ref 5.0–7.5)

## 2016-03-05 LAB — MICROSCOPIC EXAMINATION
Epithelial Cells (non renal): >10 /hpf — AB (ref 0–10)
WBC, UA: >30 /hpf — AB (ref 0–?)

## 2016-03-06 ENCOUNTER — Telehealth: Payer: Self-pay | Admitting: Nurse Practitioner

## 2016-03-06 ENCOUNTER — Other Ambulatory Visit: Payer: Self-pay | Admitting: Nurse Practitioner

## 2016-03-06 MED ORDER — DOXYCYCLINE HYCLATE 100 MG PO TABS: 100.0000 mg | ORAL_TABLET | Freq: Two times a day (BID) | ORAL | 0 refills | Status: DC

## 2016-03-06 NOTE — Progress Notes (Signed)
rx for doxyxcycline sent to pharmacy- patient just had cipro at end of July.

## 2016-03-12 ENCOUNTER — Ambulatory Visit: Payer: Medicare Other | Admitting: Obstetrics & Gynecology

## 2016-03-13 ENCOUNTER — Ambulatory Visit: Payer: Medicare Other | Admitting: Family Medicine

## 2016-03-29 NOTE — Telephone Encounter (Signed)
Patient aware of results on 08/15

## 2016-04-12 ENCOUNTER — Ambulatory Visit (INDEPENDENT_AMBULATORY_CARE_PROVIDER_SITE_OTHER): Payer: Medicare Other | Admitting: Family Medicine

## 2016-04-12 ENCOUNTER — Encounter: Payer: Self-pay | Admitting: Family Medicine

## 2016-04-12 VITALS — BP 125/71 | HR 83 | Temp 97.4°F | Ht 61.0 in | Wt 140.4 lb

## 2016-04-12 DIAGNOSIS — N183 Chronic kidney disease, stage 3 unspecified: Secondary | ICD-10-CM

## 2016-04-12 DIAGNOSIS — I10 Essential (primary) hypertension: Secondary | ICD-10-CM

## 2016-04-12 DIAGNOSIS — I878 Other specified disorders of veins: Secondary | ICD-10-CM

## 2016-04-12 MED ORDER — HYDROCHLOROTHIAZIDE 12.5 MG PO CAPS: 12.5000 mg | ORAL_CAPSULE | Freq: Every day | ORAL | 3 refills | Status: DC

## 2016-04-12 NOTE — Patient Instructions (Addendum)
Great to meet you!  Try the compression stockings  We will help arrange the ultrasound of your legs  Come back if the stockings aren't enough, wear them during daytime hours, no need to wear them when you are sleeping or have your feet up

## 2016-04-12 NOTE — Progress Notes (Signed)
   HPI  Patient presents today here with foot swelling.  Patient claims that she's had this swelling for the last 2 weeks or so.  She is very unclear which medication she is taking for hypertension, she does endorse lisinopril.  She denies any pain in either leg.  She's had recent surgery of total hip replacement on the left in June 2017, and laparoscopic cholecystectomy in July 2017.  She denies any shortness of breath or racing heart.  PMH: Smoking status noted ROS: Per HPI  Objective: BP 125/71   Pulse 83   Temp 97.4 F (36.3 C) (Oral)   Ht '5\' 1"'$  (1.549 m)   Wt 140 lb 6.4 oz (63.7 kg)   BMI 26.53 kg/m  Gen: NAD, alert, cooperative with exam HEENT: NCAT CV: RRR, good S1/S2, no murmur Resp: CTABL, no wheezes, non-labored Abd: SNTND, BS present, no guarding or organomegaly Ext: 1+ pitting edema bilateral lower extremities, left greater than right, left Circumference is 38 cm compared to 34 cm on the right. Neuro: Alert and oriented, No gross deficits  Assessment and plan:  # Venous stasis With slight leg circumference discrepancy I have ordered an ultrasound, although I'm quite confident there is no DVT. She has had surgery 2 in the last 3 months. Start compression stockings, start HCTZ at 12.5 mg She has not been taking any diuretics  # Hypertension Well-controlled Review/she was on chlorthalidone, plus Prinzide, plus Lasix, plus lisinopril I think that she will do well with lisinopril plus HCTZ.   # Chronic kidney disease stage III Repeating labs, avoid Lasix unless needed    Orders Placed This Encounter  Procedures  . US Venous Img Lower Bilateral    Standing Status:   Future    Standing Expiration Date:   06/12/2017    Order Specific Question:   Reason for Exam (SYMPTOM  OR DIAGNOSIS REQUIRED)    Answer:   rule out DVT, low likelihood    Order Specific Question:   Preferred imaging location?    Answer:   [facility]  ordered this encounter  Medications  . hydrochlorothiazide (MICROZIDE) 12.5 MG capsule    Sig: Take 1 capsule (12.5 mg total) by mouth daily.    Dispense:  30 capsule    Refill:  [place], MD [facility] 04/12/2016, 3:45 PM

## 2016-04-13 LAB — CMP14+EGFR
A/G RATIO: 0.8 — AB (ref 1.2–2.2)
ALT: 17 IU/L (ref 0–32)
AST: 33 IU/L (ref 0–40)
Albumin: 2.9 g/dL — ABNORMAL LOW (ref 3.5–4.8)
Alkaline Phosphatase: 196 IU/L — ABNORMAL HIGH (ref 39–117)
BILIRUBIN TOTAL: 0.5 mg/dL (ref 0.0–1.2)
BUN/Creatinine Ratio: 13 (ref 12–28)
BUN: 10 mg/dL (ref 8–27)
CALCIUM: 7.7 mg/dL — AB (ref 8.7–10.3)
CHLORIDE: 104 mmol/L (ref 96–106)
CO2: 28 mmol/L (ref 18–29)
Creatinine, Ser: 0.8 mg/dL (ref 0.57–1.00)
GFR calc Af Amer: 83 mL/min/{1.73_m2} (ref >59)
GFR, EST NON AFRICAN AMERICAN: 72 mL/min/{1.73_m2} (ref >59)
GLOBULIN, TOTAL: 3.5 g/dL (ref 1.5–4.5)
Glucose: 86 mg/dL (ref 65–99)
POTASSIUM: 4.6 mmol/L (ref 3.5–5.2)
SODIUM: 142 mmol/L (ref 134–144)
Total Protein: 6.4 g/dL (ref 6.0–8.5)

## 2016-04-16 ENCOUNTER — Telehealth: Payer: Self-pay | Admitting: Nurse Practitioner

## 2016-04-30 ENCOUNTER — Telehealth: Payer: Self-pay | Admitting: *Deleted

## 2016-04-30 DIAGNOSIS — Z780 Asymptomatic menopausal state: Secondary | ICD-10-CM

## 2016-04-30 NOTE — Telephone Encounter (Signed)
Patient is due for a bone density scan and she has had a fracture within the past 6 months. Order placed and message left on patient's home voicemail to call back for appointment.

## 2016-05-10 ENCOUNTER — Encounter: Payer: Medicare Other | Admitting: Adult Health

## 2016-05-22 ENCOUNTER — Encounter: Payer: Self-pay | Admitting: *Deleted

## 2016-05-22 NOTE — Telephone Encounter (Signed)
Left message earlier in the month for patient to call back and schedule a dexa scan due to having had a fracture within the past 6 months. I mailed a letter today asking for her to contact me about setting up this scan.

## 2016-05-24 ENCOUNTER — Encounter: Payer: Self-pay | Admitting: Family Medicine

## 2016-05-24 ENCOUNTER — Ambulatory Visit (INDEPENDENT_AMBULATORY_CARE_PROVIDER_SITE_OTHER): Payer: Medicare Other | Admitting: Family Medicine

## 2016-05-24 ENCOUNTER — Ambulatory Visit (INDEPENDENT_AMBULATORY_CARE_PROVIDER_SITE_OTHER): Payer: Medicare Other

## 2016-05-24 VITALS — BP 137/75 | HR 81 | Temp 97.1°F | Ht 61.0 in | Wt 138.8 lb

## 2016-05-24 DIAGNOSIS — J45901 Unspecified asthma with (acute) exacerbation: Secondary | ICD-10-CM | POA: Diagnosis not present

## 2016-05-24 DIAGNOSIS — J45909 Unspecified asthma, uncomplicated: Secondary | ICD-10-CM | POA: Diagnosis not present

## 2016-05-24 DIAGNOSIS — R059 Cough, unspecified: Secondary | ICD-10-CM

## 2016-05-24 DIAGNOSIS — R05 Cough: Secondary | ICD-10-CM | POA: Diagnosis not present

## 2016-05-24 MED ORDER — BUDESONIDE-FORMOTEROL FUMARATE 80-4.5 MCG/ACT IN AERO: 2.0000 | INHALATION_SPRAY | Freq: Two times a day (BID) | RESPIRATORY_TRACT | 0 refills | Status: DC

## 2016-05-24 MED ORDER — METHYLPREDNISOLONE ACETATE 80 MG/ML IJ SUSP
80.0000 mg | Freq: Once | INTRAMUSCULAR | Status: AC
  Administered 2016-05-24: 80 mg via INTRAMUSCULAR

## 2016-05-24 NOTE — Progress Notes (Signed)
   HPI  Patient presents today here with cough.  Patient states over the last week and a half or so she's had cough. Last week she had runny nose, mild headache, malaise, and dry cough. She had one day where she had severe malaise and slept most of the day. She feels much stronger this week, however her cough is persisting.  Her son is with her and he is very concerned because last year she had a similar episode, causing persistent cough, dizziness, and a fall which caused fracture of the hip.  She does have a history of intrinsic asthma. She has albuterol at home but does not use it.  She does not smoke. She does any history of smoking.  PMH: Smoking status noted ROS: Per HPI  Objective: BP 137/75   Pulse 81   Temp 97.1 F (36.2 C) (Oral)   Ht 5\' 1"  (1.549 m)   Wt 138 lb 12.8 oz (63 kg)   BMI 26.23 kg/m  Gen: NAD, alert, cooperative with exam HEENT: NCAT, oropharynx moist CV: RRR Resp: Nonlabored, expiratory wheezes throughout Ext: No edema, warm Neuro: Alert and oriented, walks with a walker  CXR- no infiltrate  Assessment and plan:  # Exacerbation, intrinsic asthma No signs of pneumonia, acute phase of illness resolving signifying viral etiology of flare Symptoms resolving except for continued shortness of breath and cough. Treat with IM Depo-Medrol plus starting Symbicort, I provided 1 month samples. Referring to pulm to consider PFTs for more clear Dx      Orders Placed This Encounter  Procedures  . DG Chest 2 View    Standing Status:   Future    Number of Occurrences:   1    Standing Expiration Date:   07/24/2017    Order Specific Question:   Reason for Exam (SYMPTOM  OR DIAGNOSIS REQUIRED)    Answer:   cough    Order Specific Question:   Preferred imaging location?    Answer:   [facility]  . Ambulatory referral to Pulmonology    Referral Priority:   Routine    Referral Type:   Consultation    Referral Reason:   Specialty Services Required    Requested  Specialty:   Pulmonary Disease    Number of Visits Requested:   1    Meds ordered this encounter  Medications  . budesonide-formoterol (SYMBICORT) 80-4.5 MCG/ACT inhaler    Sig: Inhale 2 puffs into the lungs 2 (two) times daily.    Dispense:  2 Inhaler    Refill:  0    Laroy Apple, MD [facility] 05/24/2016, 11:56 AM

## 2016-05-24 NOTE — Patient Instructions (Signed)
Great to see you!  Start symbicort 2 puffs twice daily every day.   Come back in 6 weeks  Please call or come back if you develop any fevers

## 2016-05-25 ENCOUNTER — Other Ambulatory Visit: Payer: Self-pay | Admitting: Family Medicine

## 2016-05-25 DIAGNOSIS — M79672 Pain in left foot: Principal | ICD-10-CM

## 2016-05-25 DIAGNOSIS — M79671 Pain in right foot: Secondary | ICD-10-CM

## 2016-05-28 ENCOUNTER — Other Ambulatory Visit: Payer: Self-pay | Admitting: *Deleted

## 2016-05-28 NOTE — Telephone Encounter (Signed)
Last filled 03/23/2016. If approved please route to pool for [facility].

## 2016-05-29 MED ORDER — ALPRAZOLAM 0.25 MG PO TABS: 0.2500 mg | ORAL_TABLET | Freq: Two times a day (BID) | ORAL | 0 refills | Status: DC | PRN

## 2016-05-29 NOTE — Telephone Encounter (Signed)
Prescription called in to [facility]

## 2016-05-29 NOTE — Telephone Encounter (Signed)
Please call in xanax with 1 refills 

## 2016-06-11 ENCOUNTER — Ambulatory Visit (INDEPENDENT_AMBULATORY_CARE_PROVIDER_SITE_OTHER): Payer: Medicare Other | Admitting: Obstetrics & Gynecology

## 2016-06-11 ENCOUNTER — Encounter: Payer: Self-pay | Admitting: Obstetrics & Gynecology

## 2016-06-11 VITALS — BP 138/80 | HR 76 | Wt 137.0 lb

## 2016-06-11 DIAGNOSIS — N813 Complete uterovaginal prolapse: Secondary | ICD-10-CM

## 2016-06-11 NOTE — Progress Notes (Signed)
Patient ID: Christie Bruce, female   DOB: Feb 26, 1941, 75 y.o.   MRN: [id number] Chief Complaint  Patient presents with  . Pessary Check    clean    Blood pressure 138/80, pulse 76, weight 137 lb (62.1 kg).  Christie Bruce presents today for routine follow up related to her pessary.   She uses a milex ring #2 She reports moderate vaginal discharge or vaginal bleeding.  Exam reveals no undue vaginal mucosal pressure of breakdown, little discharge and little vaginal bleeding.  The pessary is removed, cleaned and replaced without difficulty.    Christie Bruce will be sen back in 4 months for continued follow up.  No orders of the defined types were placed in this encounter.    Florian Buff, MD   06/11/2016 2:00 PM

## 2016-07-02 ENCOUNTER — Other Ambulatory Visit: Payer: Self-pay | Admitting: Nurse Practitioner

## 2016-07-04 NOTE — Telephone Encounter (Signed)
Please forward to Dr. Wendi Snipes. He will be in tomorrow.

## 2016-07-05 ENCOUNTER — Ambulatory Visit: Payer: Medicare Other | Admitting: Family Medicine

## 2016-07-06 ENCOUNTER — Telehealth: Payer: Self-pay | Admitting: Nurse Practitioner

## 2016-07-06 ENCOUNTER — Other Ambulatory Visit: Payer: Self-pay | Admitting: Nurse Practitioner

## 2016-07-06 MED ORDER — ALPRAZOLAM 0.25 MG PO TABS: 0.2500 mg | ORAL_TABLET | Freq: Two times a day (BID) | ORAL | 1 refills | Status: DC | PRN

## 2016-07-06 NOTE — Telephone Encounter (Signed)
She is not my patient

## 2016-07-06 NOTE — Telephone Encounter (Signed)
Last office visit 05-24-16 with Dr. Wendi Snipes.  M.Martin gave quantity of 15 xanax on 05-29-16.  Please advise on request for more pills.

## 2016-07-06 NOTE — Progress Notes (Unsigned)
Please call in xanax 0.25 1 po prn #15 with 1 refills

## 2016-07-06 NOTE — Telephone Encounter (Signed)
Defer to PCP  Laroy Apple, MD [facility] 07/06/2016, 12:09 PM

## 2016-07-09 ENCOUNTER — Institutional Professional Consult (permissible substitution): Payer: Medicare Other | Admitting: Internal Medicine

## 2016-08-16 IMAGING — CT CT RENAL STONE PROTOCOL
2 of 4 series · 16 of 46 positions shown, 18 images · non-contrast
Comparison: None.

CLINICAL DATA: Right flank and upper quadrant pain.

EXAM:
CT ABDOMEN AND PELVIS WITHOUT CONTRAST
TECHNIQUE: Multidetector CT imaging of the abdomen and pelvis was performed
following the standard protocol without IV contrast.

[Series 2: routine abd pel with · axial · 0.66mm/px · z∈[+712,+1127]mm · 13 of 91 slices shown, 15 images]
[im 4/91  soft-tissue]
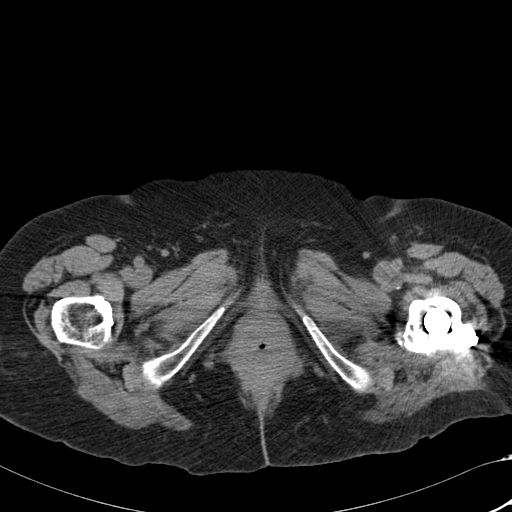
[im 4/91  bone]
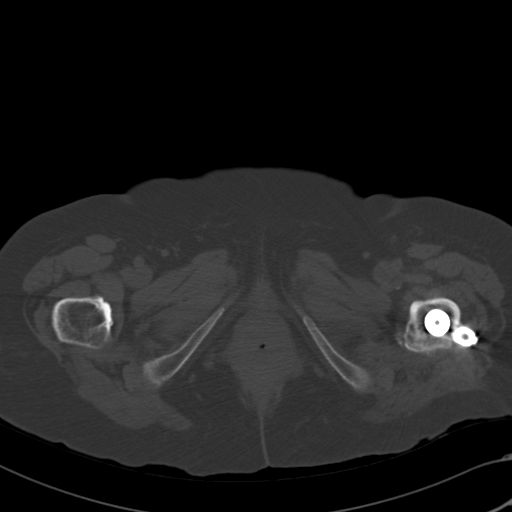
[im 12/91  soft-tissue]
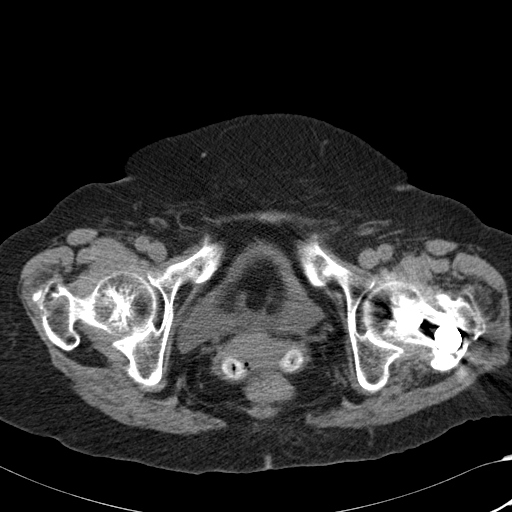
[im 19/91  soft-tissue]
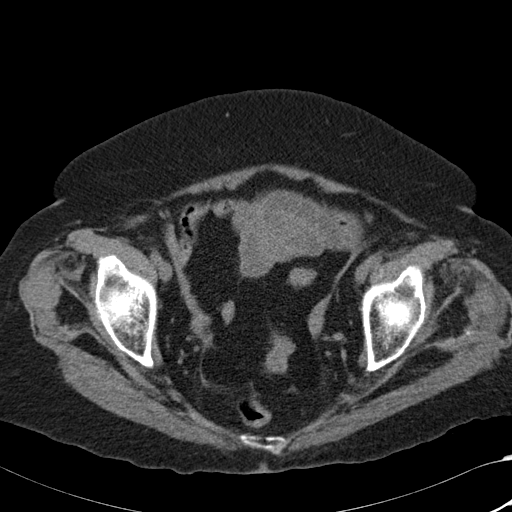
[im 27/91  soft-tissue]
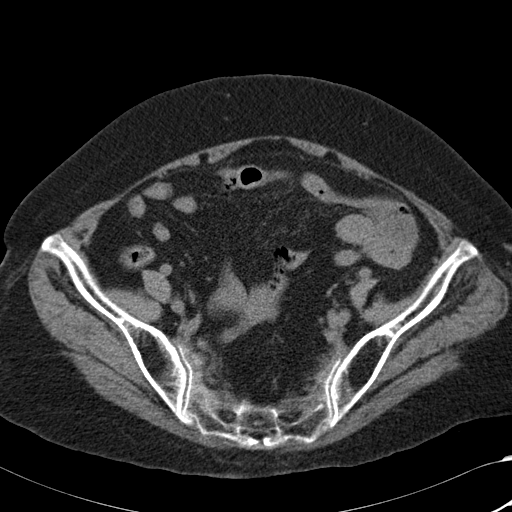
[im 31/91  soft-tissue]
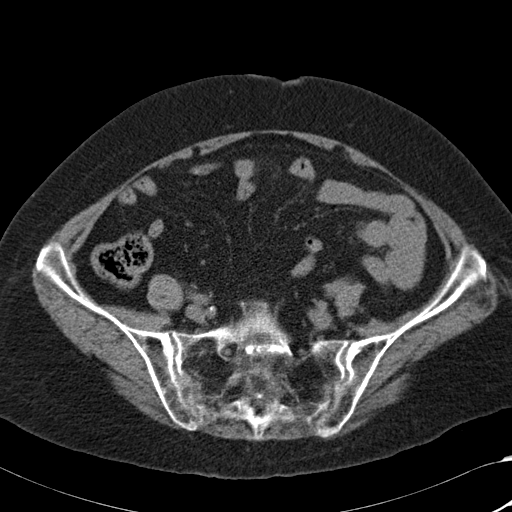
[im 38/91  soft-tissue]
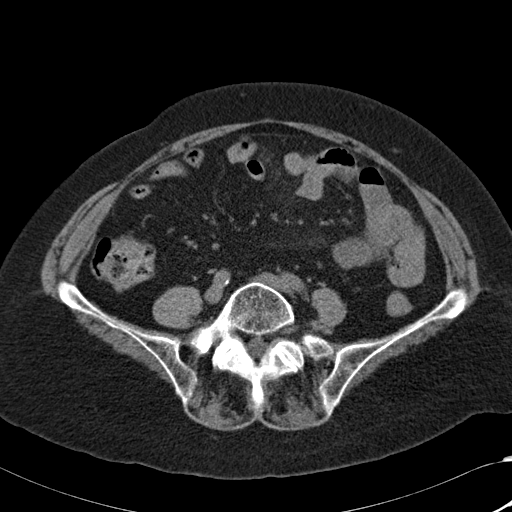
[im 46/91  soft-tissue]
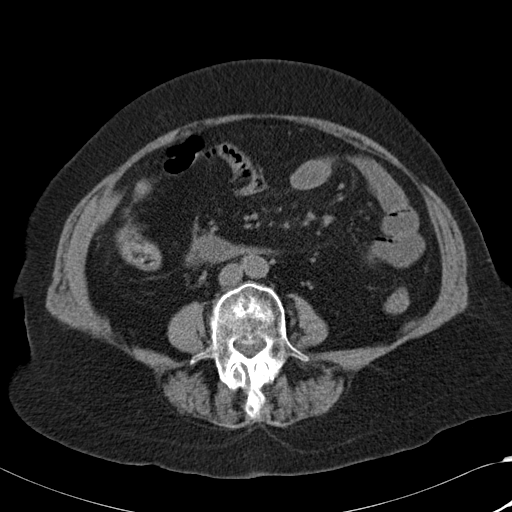
[im 53/91  soft-tissue]
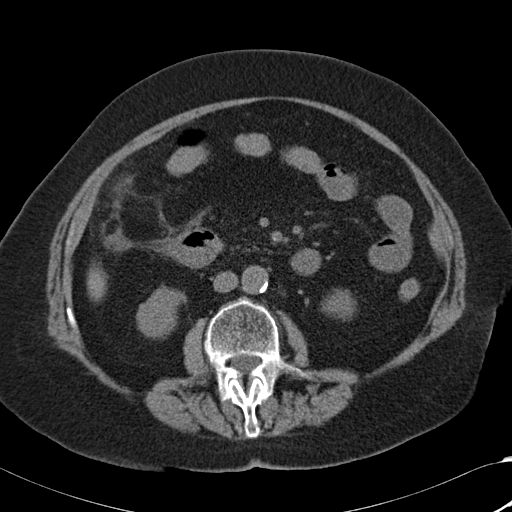
[im 61/91  soft-tissue]
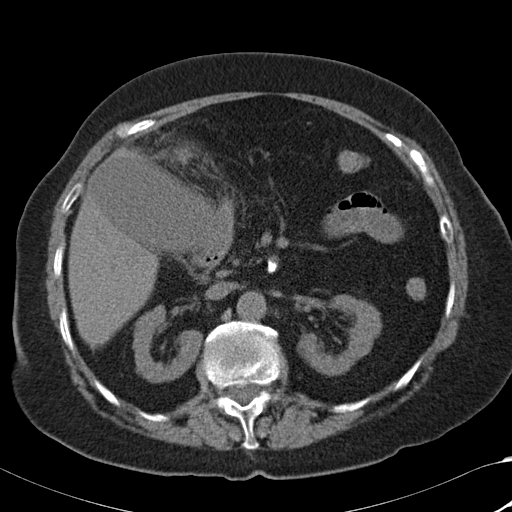
[im 61/91  bone]
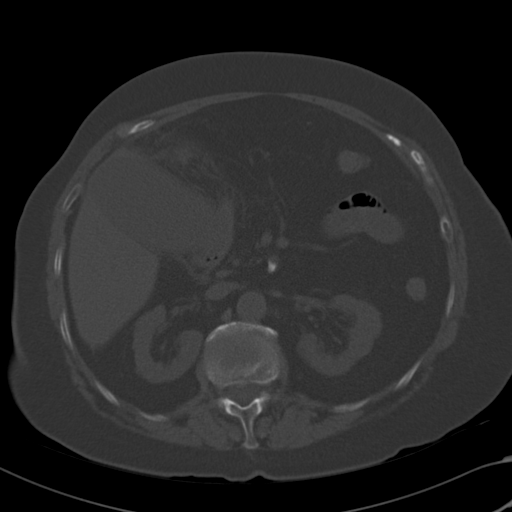
[im 64/91  soft-tissue]
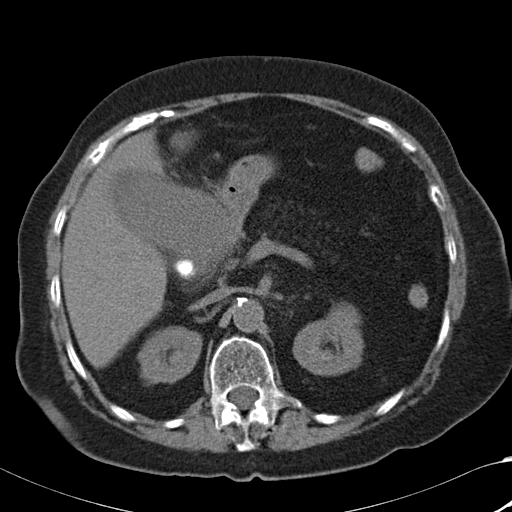
[im 72/91  soft-tissue]
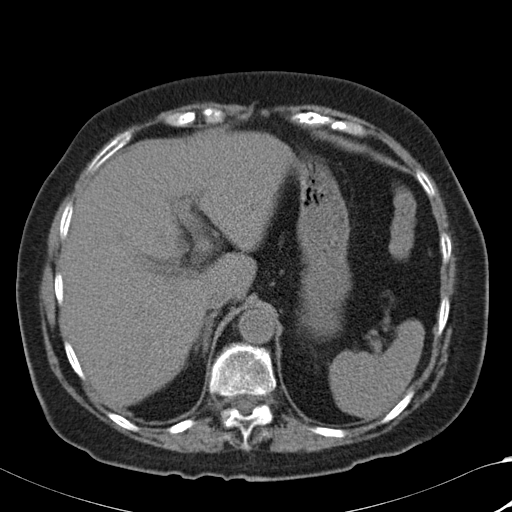
[im 79/91  soft-tissue]
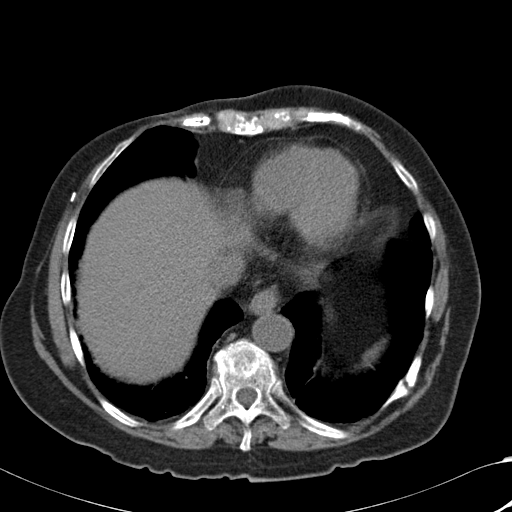
[im 87/91  soft-tissue]
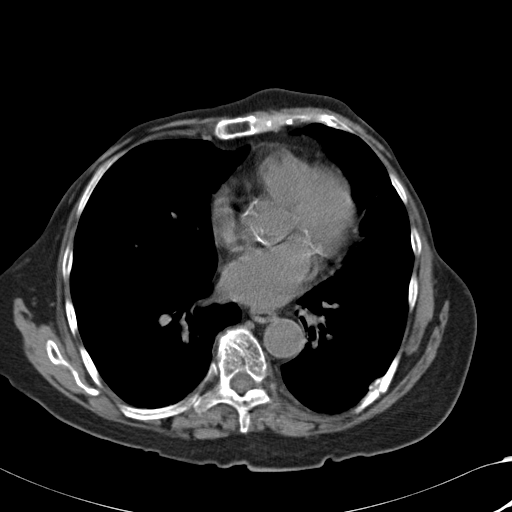

[Series 3: coronal · coronal · 0.73mm/px · 3 of 143 slices shown]
[im 48/143  soft-tissue]
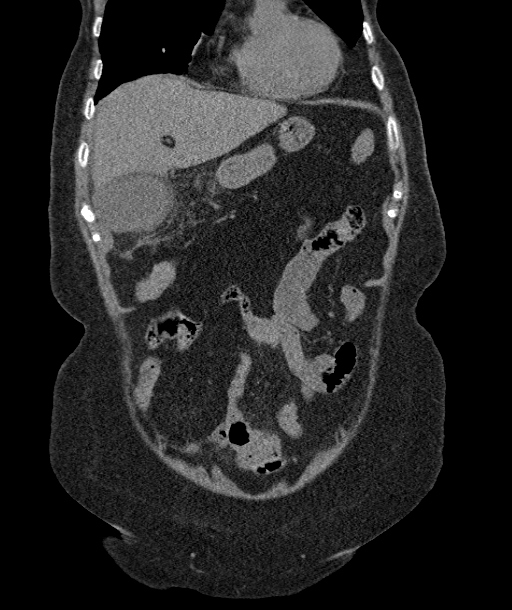
[im 64/143  soft-tissue]
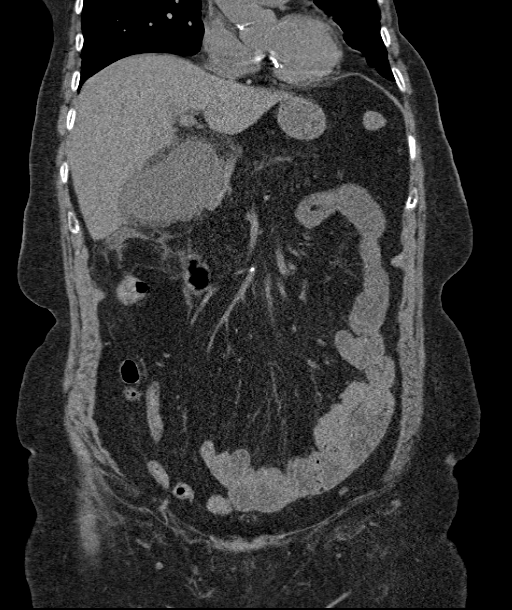
[im 79/143  soft-tissue]
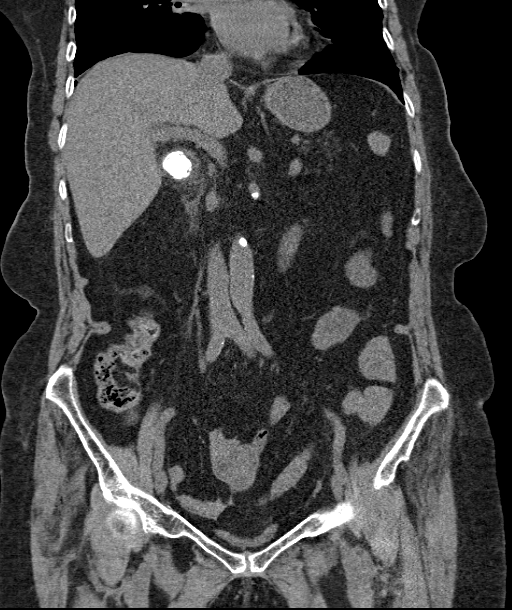

[16 of 46 positions shown; findings below may reference images not displayed]

FINDINGS: Lower chest and abdominal wall:  No contributory findings.

Hepatobiliary: No focal liver abnormality.Over distended gallbladder
with large stone in the neck. Extensive fat edema around the
gallbladder fossa. Normal common bile duct diameter.

Pancreas: Fatty atrophy of the pancreas.

Spleen: Tiny incidental low densities in the central spleen.

Adrenals/Urinary Tract: Negative adrenals. Bilateral renal atrophy
with punctate lower pole stone on the right. No hydronephrosis or
ureteral calculus. Unremarkable bladder.

Stomach/Bowel:  No obstruction. No appendicitis.

Reproductive:Hysterectomy.  Negative adnexae.  Pessary in place.

Vascular/Lymphatic: No acute vascular abnormality. Mild fat haziness
in the small bowel mesentery, likely incidental mesenteric
panniculitis in this setting.

Other: Trace ascites considered reactive.

Musculoskeletal: Remote posterior left eighth and ninth rib
fractures that are chronic but without completed healing.
Intertrochanteric left femur fracture repair without acute finding.
Remote T10, T12, L2, and L4 superior endplate fractures.
IMPRESSION: Acute cholecystitis.

## 2016-08-31 ENCOUNTER — Ambulatory Visit: Payer: Medicare Other | Admitting: Family Medicine

## 2016-08-31 ENCOUNTER — Other Ambulatory Visit: Payer: Self-pay | Admitting: Nurse Practitioner

## 2016-08-31 DIAGNOSIS — K219 Gastro-esophageal reflux disease without esophagitis: Secondary | ICD-10-CM

## 2016-09-10 ENCOUNTER — Emergency Department (HOSPITAL_COMMUNITY)
Admission: EM | Admit: 2016-09-10 | Discharge: 2016-09-10 | Disposition: A | Discharge status: Home / Self Care | Payer: Medicare Other | Attending: Emergency Medicine | Admitting: Emergency Medicine

## 2016-09-10 ENCOUNTER — Emergency Department (HOSPITAL_COMMUNITY): Payer: Medicare Other

## 2016-09-10 ENCOUNTER — Encounter (HOSPITAL_COMMUNITY): Payer: Self-pay | Admitting: Emergency Medicine

## 2016-09-10 DIAGNOSIS — Z853 Personal history of malignant neoplasm of breast: Secondary | ICD-10-CM | POA: Diagnosis not present

## 2016-09-10 DIAGNOSIS — T1490XA Injury, unspecified, initial encounter: Secondary | ICD-10-CM

## 2016-09-10 DIAGNOSIS — I129 Hypertensive chronic kidney disease with stage 1 through stage 4 chronic kidney disease, or unspecified chronic kidney disease: Secondary | ICD-10-CM | POA: Insufficient documentation

## 2016-09-10 DIAGNOSIS — X501XXA Overexertion from prolonged static or awkward postures, initial encounter: Secondary | ICD-10-CM | POA: Insufficient documentation

## 2016-09-10 DIAGNOSIS — Y929 Unspecified place or not applicable: Secondary | ICD-10-CM | POA: Insufficient documentation

## 2016-09-10 DIAGNOSIS — J45909 Unspecified asthma, uncomplicated: Secondary | ICD-10-CM | POA: Diagnosis not present

## 2016-09-10 DIAGNOSIS — M25552 Pain in left hip: Secondary | ICD-10-CM | POA: Diagnosis not present

## 2016-09-10 DIAGNOSIS — N183 Chronic kidney disease, stage 3 (moderate): Secondary | ICD-10-CM | POA: Diagnosis not present

## 2016-09-10 DIAGNOSIS — Z79899 Other long term (current) drug therapy: Secondary | ICD-10-CM | POA: Diagnosis not present

## 2016-09-10 DIAGNOSIS — Y999 Unspecified external cause status: Secondary | ICD-10-CM | POA: Insufficient documentation

## 2016-09-10 DIAGNOSIS — Y9389 Activity, other specified: Secondary | ICD-10-CM | POA: Diagnosis not present

## 2016-09-10 DIAGNOSIS — S99912A Unspecified injury of left ankle, initial encounter: Secondary | ICD-10-CM | POA: Diagnosis not present

## 2016-09-10 DIAGNOSIS — S8991XA Unspecified injury of right lower leg, initial encounter: Secondary | ICD-10-CM | POA: Insufficient documentation

## 2016-09-10 DIAGNOSIS — M25572 Pain in left ankle and joints of left foot: Secondary | ICD-10-CM | POA: Diagnosis not present

## 2016-09-10 DIAGNOSIS — M25462 Effusion, left knee: Secondary | ICD-10-CM | POA: Diagnosis not present

## 2016-09-10 DIAGNOSIS — Z87891 Personal history of nicotine dependence: Secondary | ICD-10-CM | POA: Insufficient documentation

## 2016-09-10 MED ORDER — ACETAMINOPHEN 325 MG PO TABS
650.0000 mg | ORAL_TABLET | Freq: Once | ORAL | Status: AC
  Administered 2016-09-10: 650 mg via ORAL
  Filled 2016-09-10: qty 2
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
## 2016-09-10 NOTE — Discharge Instructions (Signed)
Do not bear weight on left leg.  Use ace wrap, elevate knee, use cold therapy.  Call Dr. Percell Miller for recheck this week.
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
## 2016-09-12 DIAGNOSIS — M25562 Pain in left knee: Secondary | ICD-10-CM | POA: Diagnosis not present

## 2016-09-18 ENCOUNTER — Other Ambulatory Visit: Payer: Self-pay | Admitting: Family Medicine

## 2016-09-26 DIAGNOSIS — M1712 Unilateral primary osteoarthritis, left knee: Secondary | ICD-10-CM | POA: Diagnosis not present

## 2016-10-03 IMAGING — US US ABDOMEN LIMITED
1 series · 14 of 25 positions shown · non-contrast
Comparison: February 11, 2016

CLINICAL DATA: Cholelithiasis and acute cholecystitis as seen on CT
imaging.

EXAM:
US ABDOMEN LIMITED - RIGHT UPPER QUADRANT

[Series 1: us abdomen limited · 0.25mm/px · 14 of 65 slices shown]
[im 1/65]
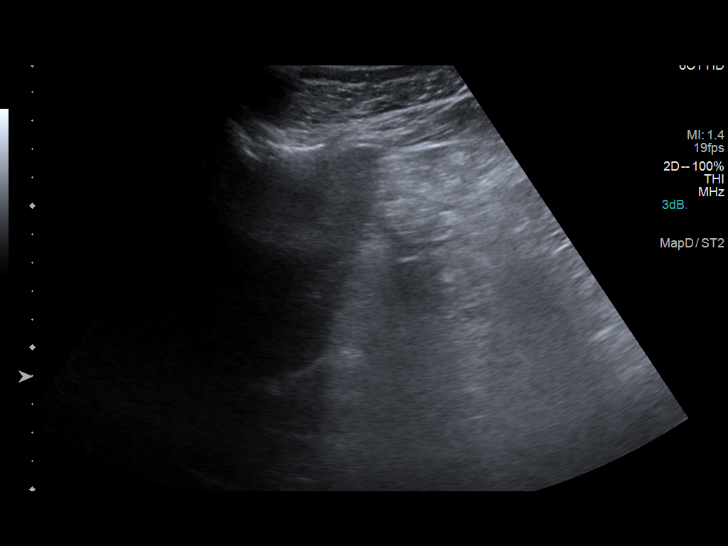
[im 6/65]
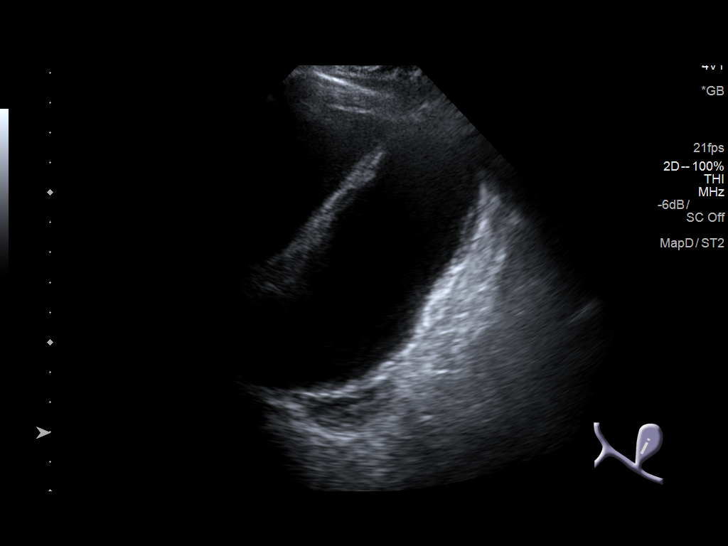
[im 11/65]
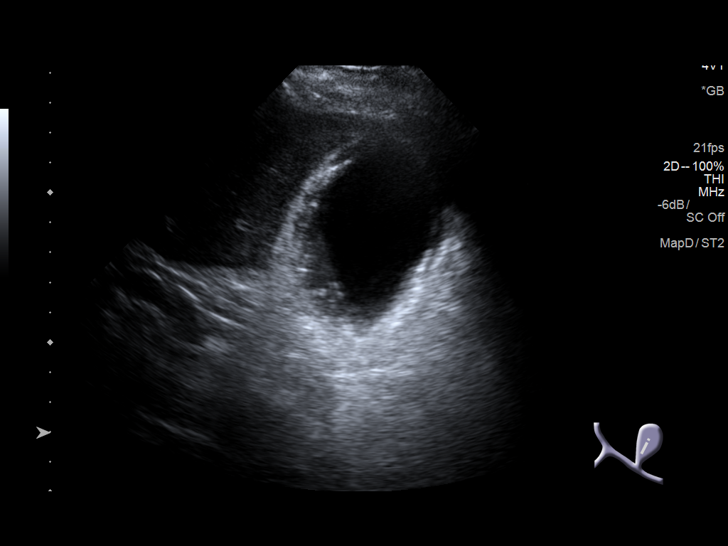
[im 17/65]
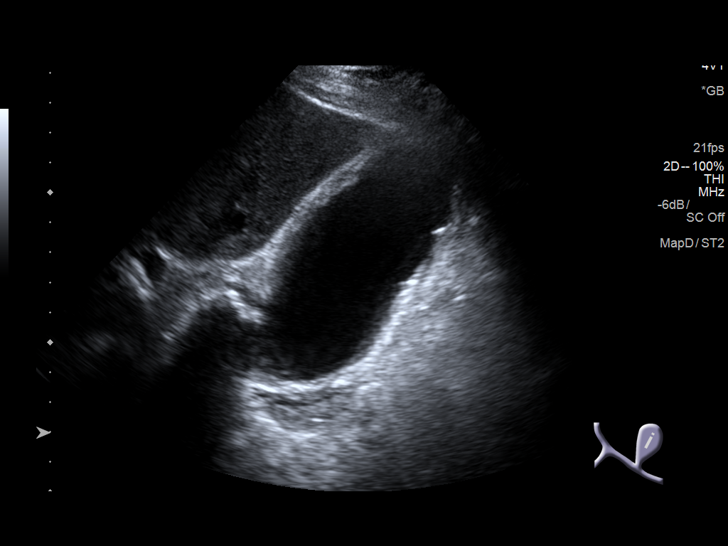
[im 22/65]
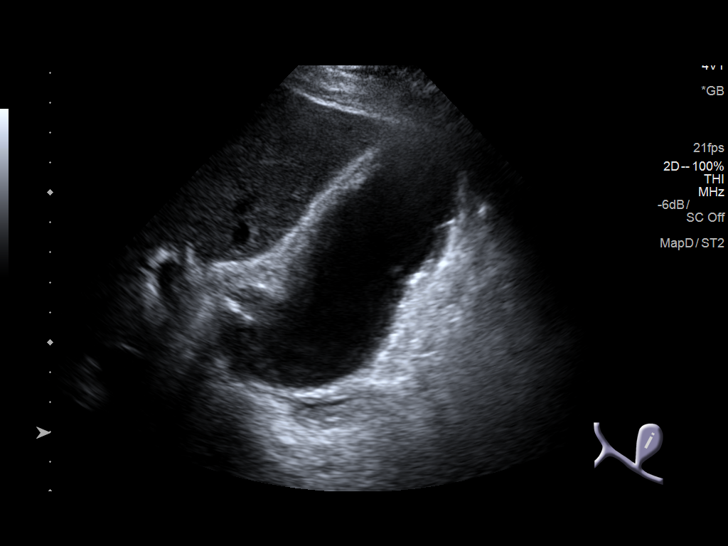
[im 25/65]
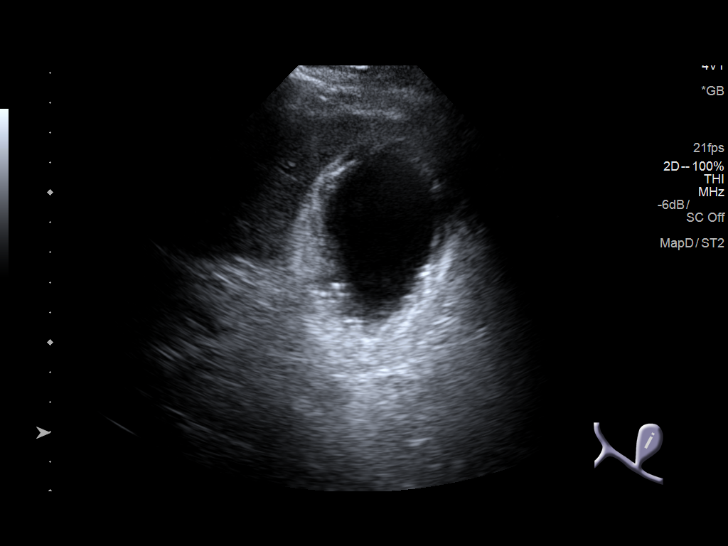
[im 30/65]
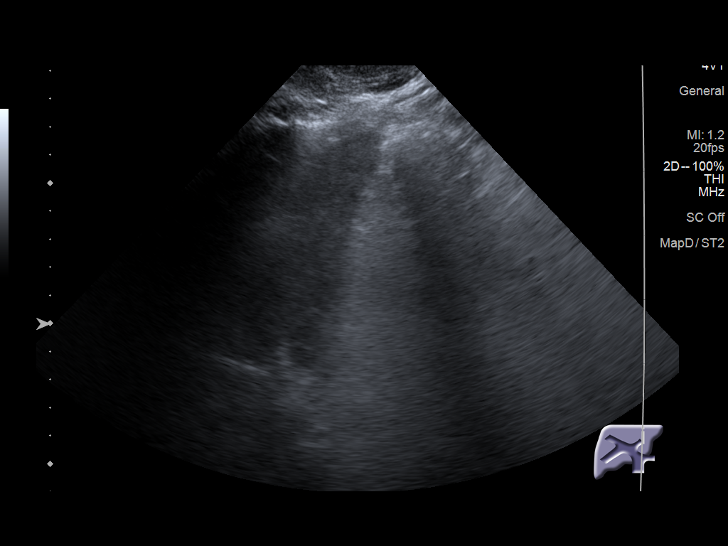
[im 35/65]
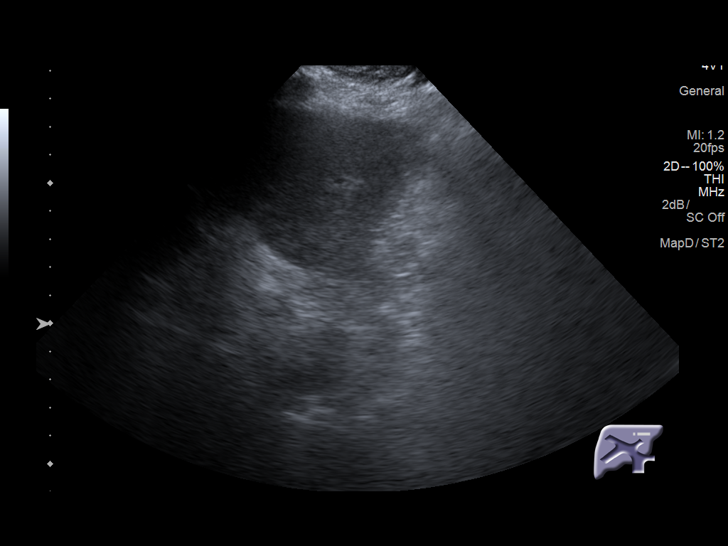
[im 41/65]
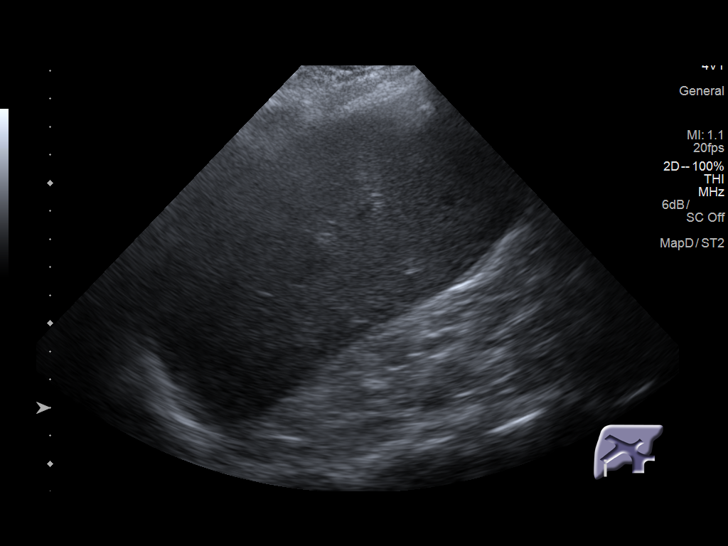
[im 43/65]
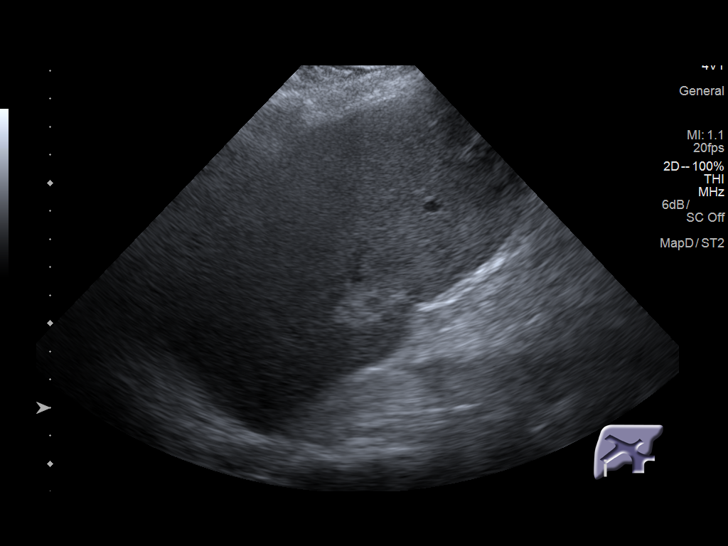
[im 49/65]
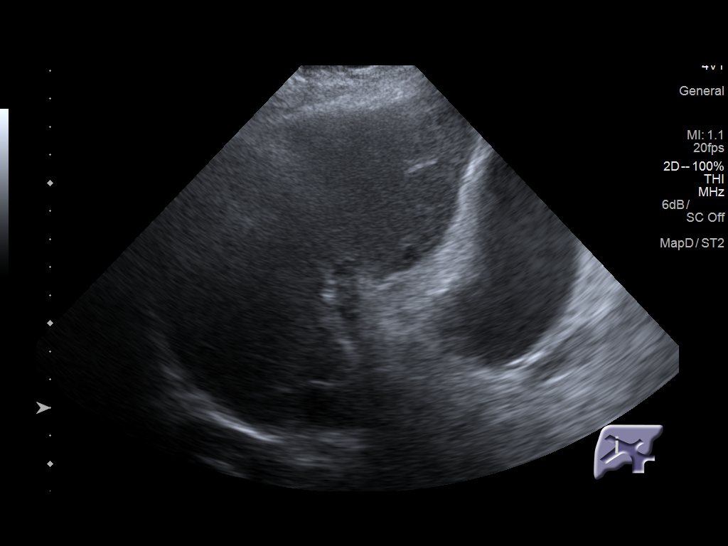
[im 54/65]
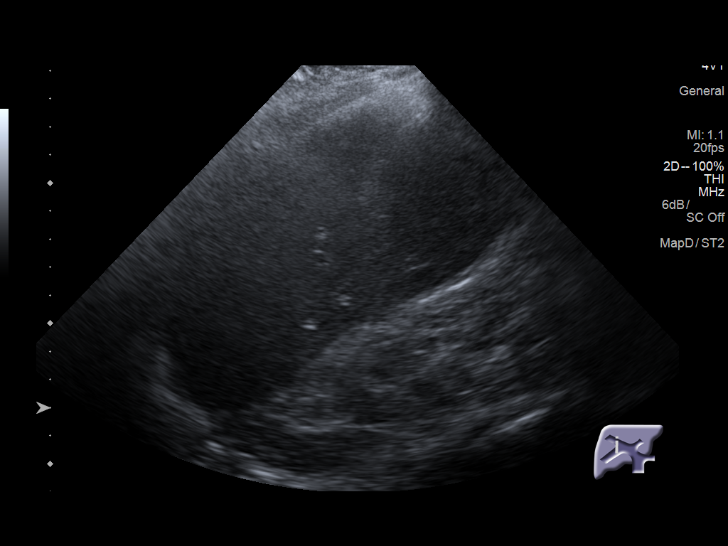
[im 59/65]
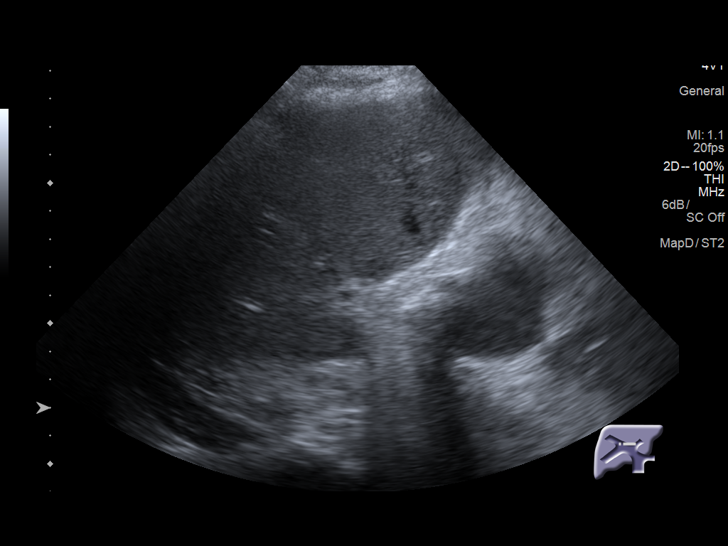
[im 65/65]
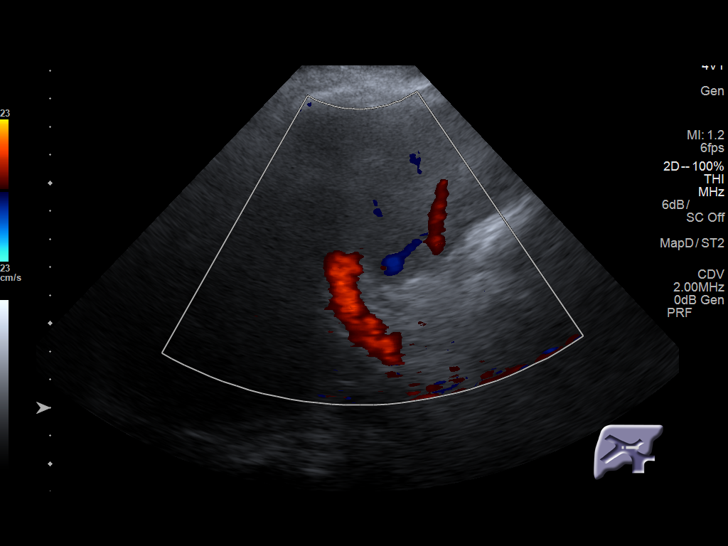

[14 of 25 positions shown; findings below may reference images not displayed]

FINDINGS: Gallbladder:

There is a non mobile stone in the neck of the gallbladder resulting
in wall thickening. Pericholecystic fluid was also seen on the CT
scan but is not appreciated on this ultrasound. No [name]'s sign
reported. The stone in the neck of the gallbladder measures 2.4 cm.

Common bile duct:

Diameter: 4 mm

Liver:

No focal lesion identified. Within normal limits in parenchymal
echogenicity.
IMPRESSION: There is a 2.4 cm stone lodged in the neck of the gallbladder
resulting in wall thickening. On CT imaging, there was also
gallbladder distention and pericholecystic fluid in addition to
adjacent fat stranding. The combination of ultrasound and CT
findings are consistent with acute cholecystitis.

## 2016-10-09 ENCOUNTER — Encounter: Payer: Self-pay | Admitting: Obstetrics & Gynecology

## 2016-10-09 ENCOUNTER — Ambulatory Visit (INDEPENDENT_AMBULATORY_CARE_PROVIDER_SITE_OTHER): Payer: Medicare Other | Admitting: Obstetrics & Gynecology

## 2016-10-09 VITALS — BP 128/78 | HR 7 | Wt 141.0 lb

## 2016-10-09 DIAGNOSIS — Z4689 Encounter for fitting and adjustment of other specified devices: Secondary | ICD-10-CM

## 2016-10-09 DIAGNOSIS — N813 Complete uterovaginal prolapse: Secondary | ICD-10-CM | POA: Diagnosis not present

## 2016-10-09 NOTE — Progress Notes (Signed)
Patient ID: Christie Bruce, female   DOB: [birth year], 76 y.o.   MRN: [id number] Chief Complaint  Patient presents with  . Follow-up    pessary     Blood pressure 128/78, pulse (!) 7, weight 141 lb (64 kg).  Christie Bruce presents today for routine follow up related to her pessary.   She uses a milex ring #2 She reports moderate vaginal discharge or vaginal bleeding.  Exam reveals no undue vaginal mucosal pressure of breakdown, little discharge and little vaginal bleeding.  The pessary is removed, cleaned and replaced without difficulty.    Christie Bruce will be sen back in 4 months for continued follow up.  No orders of the defined types were placed in this encounter.    Florian Buff, MD   10/09/2016 10:55 AM

## 2016-10-10 ENCOUNTER — Encounter: Payer: Self-pay | Admitting: Physician Assistant

## 2016-10-10 ENCOUNTER — Ambulatory Visit (INDEPENDENT_AMBULATORY_CARE_PROVIDER_SITE_OTHER): Payer: Medicare Other | Admitting: Physician Assistant

## 2016-10-10 VITALS — BP 134/71 | HR 82 | Temp 97.1°F | Ht 62.5 in | Wt 140.6 lb

## 2016-10-10 DIAGNOSIS — G629 Polyneuropathy, unspecified: Secondary | ICD-10-CM | POA: Diagnosis not present

## 2016-10-10 DIAGNOSIS — N183 Chronic kidney disease, stage 3 unspecified: Secondary | ICD-10-CM

## 2016-10-10 DIAGNOSIS — S29019A Strain of muscle and tendon of unspecified wall of thorax, initial encounter: Secondary | ICD-10-CM | POA: Diagnosis not present

## 2016-10-10 DIAGNOSIS — F419 Anxiety disorder, unspecified: Secondary | ICD-10-CM | POA: Diagnosis not present

## 2016-10-10 DIAGNOSIS — I10 Essential (primary) hypertension: Secondary | ICD-10-CM

## 2016-10-10 MED ORDER — ALPRAZOLAM 0.25 MG PO TABS: 0.2500 mg | ORAL_TABLET | Freq: Two times a day (BID) | ORAL | 1 refills | Status: DC | PRN

## 2016-10-10 MED ORDER — PREDNISONE 10 MG (21) PO TBPK: ORAL_TABLET | ORAL | 0 refills | Status: DC

## 2016-10-10 NOTE — Patient Instructions (Signed)
Low Back Sprain Rehab  Ask your health care provider which exercises are safe for you. Do exercises exactly as told by your health care provider and adjust them as directed. It is normal to feel mild stretching, pulling, tightness, or discomfort as you do these exercises, but you should stop right away if you feel sudden pain or your pain gets worse. Do not begin these exercises until told by your health care provider.  Stretching and range of motion exercises  These exercises warm up your muscles and joints and improve the movement and flexibility of your back. These exercises also help to relieve pain, numbness, and tingling.  Exercise A: Lumbar rotation     1. Lie on your back on a firm surface and bend your knees.  2. Straighten your arms out to your sides so each arm forms an "L" shape with a side of your body (a 90 degree angle).  3. Slowly move both of your knees to one side of your body until you feel a stretch in your lower back. Try not to let your shoulders move off of the floor.  4. Hold for __________ seconds.  5. Tense your abdominal muscles and slowly move your knees back to the starting position.  6. Repeat this exercise on the other side of your body.  Repeat __________ times. Complete this exercise __________ times a day.  Exercise B: Prone extension on elbows     1. Lie on your abdomen on a firm surface.  2. Prop yourself up on your elbows.  3. Use your arms to help lift your chest up until you feel a gentle stretch in your abdomen and your lower back.  ? This will place some of your body weight on your elbows. If this is uncomfortable, try stacking pillows under your chest.  ? Your hips should stay down, against the surface that you are lying on. Keep your hip and back muscles relaxed.  4. Hold for __________ seconds.  5. Slowly relax your upper body and return to the starting position.  Repeat __________ times. Complete this exercise __________ times a day.  Strengthening exercises  These  exercises build strength and endurance in your back. Endurance is the ability to use your muscles for a long time, even after they get tired.  Exercise C: Pelvic tilt   1. Lie on your back on a firm surface. Bend your knees and keep your feet flat.  2. Tense your abdominal muscles. Tip your pelvis up toward the ceiling and flatten your lower back into the floor.  ? To help with this exercise, you may place a small towel under your lower back and try to push your back into the towel.  3. Hold for __________ seconds.  4. Let your muscles relax completely before you repeat this exercise.  Repeat __________ times. Complete this exercise __________ times a day.  Exercise D: Alternating arm and leg raises     1. Get on your hands and knees on a firm surface. If you are on a hard floor, you may want to use padding to cushion your knees, such as an exercise mat.  2. Line up your arms and legs. Your hands should be below your shoulders, and your knees should be below your hips.  3. Lift your left leg behind you. At the same time, raise your right arm and straighten it in front of you.  ? Do not lift your leg higher than your hip.  ? Do   not lift your arm higher than your shoulder.  ? Keep your abdominal and back muscles tight.  ? Keep your hips facing the ground.  ? Do not arch your back.  ? Keep your balance carefully, and do not hold your breath.  4. Hold for __________ seconds.  5. Slowly return to the starting position and repeat with your right leg and your left arm.  Repeat __________ times. Complete this exercise __________ times a day.  Exercise E: Abdominal set with straight leg raise     1. Lie on your back on a firm surface.  2. Bend one of your knees and keep your other leg straight.  3. Tense your abdominal muscles and lift your straight leg up, 4-6 inches (10-15 cm) off the ground.  4. Keep your abdominal muscles tight and hold for __________ seconds.  ? Do not hold your breath.  ? Do not arch your back. Keep it  flat against the ground.  5. Keep your abdominal muscles tense as you slowly lower your leg back to the starting position.  6. Repeat with your other leg.  Repeat __________ times. Complete this exercise __________ times a day.  Posture and body mechanics     Body mechanics refers to the movements and positions of your body while you do your daily activities. Posture is part of body mechanics. Good posture and healthy body mechanics can help to relieve stress in your body's tissues and joints. Good posture means that your spine is in its natural S-curve position (your spine is neutral), your shoulders are pulled back slightly, and your head is not tipped forward. The following are general guidelines for applying improved posture and body mechanics to your everyday activities.  Standing     · When standing, keep your spine neutral and your feet about hip-width apart. Keep a slight bend in your knees. Your ears, shoulders, and hips should line up.  · When you do a task in which you stand in one place for a long time, place one foot up on a stable object that is 2-4 inches (5-10 cm) high, such as a footstool. This helps keep your spine neutral.  Sitting     · When sitting, keep your spine neutral and keep your feet flat on the floor. Use a footrest, if necessary, and keep your thighs parallel to the floor. Avoid rounding your shoulders, and avoid tilting your head forward.  · When working at a desk or a computer, keep your desk at a height where your hands are slightly lower than your elbows. Slide your chair under your desk so you are close enough to maintain good posture.  · When working at a computer, place your monitor at a height where you are looking straight ahead and you do not have to tilt your head forward or downward to look at the screen.  Resting     · When lying down and resting, avoid positions that are most painful for you.  · If you have pain with activities such as sitting, bending, stooping, or  squatting (flexion-based activities), lie in a position in which your body does not bend very much. For example, avoid curling up on your side with your arms and knees near your chest (fetal position).  · If you have pain with activities such as standing for a long time or reaching with your arms (extension-based activities), lie with your spine in a neutral position and bend your knees slightly. Try the following   positions:  · Lying on your side with a pillow between your knees.  · Lying on your back with a pillow under your knees.  Lifting     · When lifting objects, keep your feet at least shoulder-width apart and tighten your abdominal muscles.  · Bend your knees and hips and keep your spine neutral. It is important to lift using the strength of your legs, not your back. Do not lock your knees straight out.  · Always ask for help to lift heavy or awkward objects.  This information is not intended to replace advice given to you by your health care provider. Make sure you discuss any questions you have with your health care provider.  Document Released: 07/09/2005 Document Revised: 03/15/2016 Document Reviewed: 04/20/2015  [employer] Interactive Patient Education © 2017 [employer]
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
## 2016-10-11 LAB — CBC WITH DIFFERENTIAL/PLATELET
BASOS ABS: 0 10*3/uL (ref 0.0–0.2)
Basos: 0 %
EOS (ABSOLUTE): 0.2 10*3/uL (ref 0.0–0.4)
Eos: 4 %
Hematocrit: 33.6 % — ABNORMAL LOW (ref 34.0–46.6)
Hemoglobin: 10.9 g/dL — ABNORMAL LOW (ref 11.1–15.9)
IMMATURE GRANULOCYTES: 0 %
Immature Grans (Abs): 0 10*3/uL (ref 0.0–0.1)
LYMPHS ABS: 2.1 10*3/uL (ref 0.7–3.1)
Lymphs: 35 %
MCH: 30.4 pg (ref 26.6–33.0)
MCHC: 32.4 g/dL (ref 31.5–35.7)
MCV: 94 fL (ref 79–97)
MONOS ABS: 0.4 10*3/uL (ref 0.1–0.9)
Monocytes: 7 %
NEUTROS PCT: 54 %
Neutrophils Absolute: 3.3 10*3/uL (ref 1.4–7.0)
PLATELETS: 264 10*3/uL (ref 150–379)
RBC: 3.58 x10E6/uL — AB (ref 3.77–5.28)
RDW: 14.4 % (ref 12.3–15.4)
WBC: 6.1 10*3/uL (ref 3.4–10.8)

## 2016-10-11 LAB — CMP14+EGFR
ALT: 14 IU/L (ref 0–32)
AST: 24 IU/L (ref 0–40)
Albumin/Globulin Ratio: 1.6 (ref 1.2–2.2)
Albumin: 4.5 g/dL (ref 3.5–4.8)
Alkaline Phosphatase: 156 IU/L — ABNORMAL HIGH (ref 39–117)
BILIRUBIN TOTAL: 0.8 mg/dL (ref 0.0–1.2)
BUN/Creatinine Ratio: 20 (ref 12–28)
BUN: 25 mg/dL (ref 8–27)
CALCIUM: 9.3 mg/dL (ref 8.7–10.3)
CHLORIDE: 101 mmol/L (ref 96–106)
CO2: 20 mmol/L (ref 18–29)
Creatinine, Ser: 1.23 mg/dL — ABNORMAL HIGH (ref 0.57–1.00)
GFR calc Af Amer: 49 mL/min/{1.73_m2} — ABNORMAL LOW (ref >59)
GFR calc non Af Amer: 43 mL/min/{1.73_m2} — ABNORMAL LOW (ref >59)
GLUCOSE: 88 mg/dL (ref 65–99)
Globulin, Total: 2.8 g/dL (ref 1.5–4.5)
Potassium: 4.3 mmol/L (ref 3.5–5.2)
Sodium: 143 mmol/L (ref 134–144)
Total Protein: 7.3 g/dL (ref 6.0–8.5)

## 2016-10-11 LAB — MICROALBUMIN / CREATININE URINE RATIO
CREATININE, UR: 100 mg/dL
Microalb/Creat Ratio: 10.6 mg/g creat (ref 0.0–30.0)
Microalbumin, Urine: 10.6 ug/mL

## 2016-10-18 ENCOUNTER — Other Ambulatory Visit: Payer: Self-pay | Admitting: Family Medicine

## 2016-10-18 DIAGNOSIS — M79671 Pain in right foot: Secondary | ICD-10-CM

## 2016-10-18 DIAGNOSIS — M79672 Pain in left foot: Principal | ICD-10-CM

## 2016-10-20 ENCOUNTER — Other Ambulatory Visit: Payer: Self-pay | Admitting: Family Medicine

## 2016-10-20 DIAGNOSIS — K219 Gastro-esophageal reflux disease without esophagitis: Secondary | ICD-10-CM

## 2016-10-30 ENCOUNTER — Ambulatory Visit: Payer: Medicare Other | Admitting: Physician Assistant

## 2016-10-31 ENCOUNTER — Encounter: Payer: Self-pay | Admitting: Physician Assistant

## 2016-10-31 ENCOUNTER — Telehealth: Payer: Self-pay | Admitting: Physician Assistant

## 2016-11-06 ENCOUNTER — Other Ambulatory Visit: Payer: Self-pay | Admitting: Family Medicine

## 2016-11-06 DIAGNOSIS — F419 Anxiety disorder, unspecified: Secondary | ICD-10-CM

## 2016-11-06 NOTE — Telephone Encounter (Signed)
Detailed message left for patient to call us back to reschedule appointment.

## 2016-11-06 NOTE — Telephone Encounter (Signed)
Last seen Christie Bruce 05/2016- please address rf on xanax  Angel last filled for #15 on 10/10/16. Takes BID PRN

## 2016-11-06 NOTE — Telephone Encounter (Signed)
Defer to [place], MD [facility] 11/06/2016, 1:59 PM

## 2016-11-07 NOTE — Telephone Encounter (Signed)
Patient aware that medication has been called to pharmacy and appt made for 11/21/16 at 8:40 for a follow up appt with Particia Nearing, PA

## 2016-11-21 ENCOUNTER — Telehealth: Payer: Self-pay | Admitting: Physician Assistant

## 2016-11-21 ENCOUNTER — Encounter: Payer: Self-pay | Admitting: Physician Assistant

## 2016-11-21 ENCOUNTER — Ambulatory Visit (INDEPENDENT_AMBULATORY_CARE_PROVIDER_SITE_OTHER): Payer: Medicare Other | Admitting: Physician Assistant

## 2016-11-21 DIAGNOSIS — F419 Anxiety disorder, unspecified: Secondary | ICD-10-CM

## 2016-11-21 DIAGNOSIS — K219 Gastro-esophageal reflux disease without esophagitis: Secondary | ICD-10-CM | POA: Diagnosis not present

## 2016-11-21 DIAGNOSIS — M79671 Pain in right foot: Secondary | ICD-10-CM

## 2016-11-21 DIAGNOSIS — M79672 Pain in left foot: Secondary | ICD-10-CM

## 2016-11-21 MED ORDER — GABAPENTIN 300 MG PO CAPS: 300.0000 mg | ORAL_CAPSULE | Freq: Three times a day (TID) | ORAL | 5 refills | Status: DC

## 2016-11-21 MED ORDER — OMEPRAZOLE 40 MG PO CPDR: 40.0000 mg | DELAYED_RELEASE_CAPSULE | Freq: Every day | ORAL | 3 refills | Status: DC

## 2016-11-21 MED ORDER — LISINOPRIL 10 MG PO TABS: 10.0000 mg | ORAL_TABLET | Freq: Every day | ORAL | 3 refills | Status: DC

## 2016-11-21 MED ORDER — ALPRAZOLAM 0.25 MG PO TABS: 0.2500 mg | ORAL_TABLET | Freq: Two times a day (BID) | ORAL | 2 refills | Status: DC

## 2016-11-21 MED ORDER — HYDROCHLOROTHIAZIDE 12.5 MG PO CAPS: 12.5000 mg | ORAL_CAPSULE | Freq: Every day | ORAL | 3 refills | Status: DC

## 2016-11-21 NOTE — Telephone Encounter (Signed)
Patient aware.

## 2016-11-21 NOTE — Telephone Encounter (Signed)
Resent maintenance meds

## 2016-11-21 NOTE — Patient Instructions (Signed)

## 2016-11-23 DIAGNOSIS — M79672 Pain in left foot: Secondary | ICD-10-CM

## 2016-11-23 DIAGNOSIS — M79671 Pain in right foot: Secondary | ICD-10-CM | POA: Insufficient documentation

## 2016-11-23 DIAGNOSIS — F419 Anxiety disorder, unspecified: Secondary | ICD-10-CM | POA: Insufficient documentation

## 2016-11-23 NOTE — Telephone Encounter (Signed)
Sent to wrong pool. \

## 2016-11-23 NOTE — Progress Notes (Signed)
BP 136/80   Pulse 77   Temp (!) 96.9 F (36.1 C) (Oral)   Ht 5' 2.5" (1.588 m)   Wt 149 lb 3.2 oz (67.7 kg)   BMI 26.85 kg/m    Subjective:    Patient ID: Christie Bruce, female    DOB: Dec 01, 1940, 76 y.o.   MRN: [id number]  HPI: MARGIE Bruce is a 76 y.o. female presenting on 11/21/2016 for follow up on muscle strain  This patient comes in for periodic recheck on medications and conditions including anxiety, GERD and foot pain. She is doing well overall.   All medications are reviewed today. There are no reports of any problems with the medications. All of the medical conditions are reviewed and updated.  Lab work is reviewed and will be ordered as medically necessary. There are no new problems reported with today's visit.   Relevant past medical, surgical, family and social history reviewed and updated as indicated. Allergies and medications reviewed and updated.  Past Medical History:  Diagnosis Date  . Asthma    small issue and uses inhaler if needed  . Breast cancer ([place])   . Chronic kidney disease   . 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 GERD (gastroesophageal reflux disease)   . GERD (gastroesophageal reflux disease) 09/12/2011  . Heart murmur   . Hypertension   . PONV (postoperative nausea and vomiting)   . Uterine prolapse     Past Surgical History:  Procedure Laterality Date  . bilateral mastectomy  07/19/10   bilat mastectomy for DCIS  . CHOLECYSTECTOMY N/A 02/13/2016   Procedure: LAPAROSCOPIC CHOLECYSTECTOMY;  Surgeon: Aviva Signs, MD;  Location: [facility];  Service: General;  Laterality: N/A;  . HIP SURGERY    . INTRAMEDULLARY (IM) NAIL INTERTROCHANTERIC Left 12/28/2015   Procedure: INTRAMEDULLARY (IM) NAIL LEFT HIP;  Surgeon: Renette Butters, MD;  Location: [facility];  Service: Orthopedics;  Laterality: Left;  . KNEE SURGERY Left   . WRIST SURGERY Left     Review of Systems  Constitutional: Negative.  Negative for activity change, fatigue and  fever.  HENT: Negative.   Eyes: Negative.   Respiratory: Negative.  Negative for cough.   Cardiovascular: Negative.  Negative for chest pain.  Gastrointestinal: Negative.  Negative for abdominal pain.  Endocrine: Negative.   Genitourinary: Negative.  Negative for dysuria.  Musculoskeletal: Negative.   Skin: Negative.   Neurological: Negative.     Allergies as of 11/21/2016      Reactions   Celebrex [celecoxib]    Sulfa Antibiotics    Had sores in my mouth, bottom of my feet "the skin just came right off"      Medication List       Accurate as of 11/21/16 11:59 PM. Always use your most recent med list.          ALPRAZolam 0.25 MG tablet Commonly known as:  XANAX Take 1 tablet (0.25 mg total) by mouth 2 (two) times daily.   budesonide-formoterol 80-4.5 MCG/ACT inhaler Commonly known as:  SYMBICORT Inhale 2 puffs into the lungs 2 (two) times daily.   cetirizine 10 MG tablet Commonly known as:  ZYRTEC Take 1 tablet (10 mg total) by mouth daily as needed for allergies or rhinitis.   gabapentin 300 MG capsule Commonly known as:  NEURONTIN Take 1 capsule (300 mg total) by mouth 3 (three) times daily.   hydrochlorothiazide 12.5 MG capsule Commonly known as:  MICROZIDE Take 1 capsule (12.5  mg total) by mouth daily.   lisinopril 10 MG tablet Commonly known as:  PRINIVIL,ZESTRIL Take 1 tablet (10 mg total) by mouth daily.   omeprazole 40 MG capsule Commonly known as:  PRILOSEC Take 1 capsule (40 mg total) by mouth daily.          Objective:    BP 136/80   Pulse 77   Temp (!) 96.9 F (36.1 C) (Oral)   Ht 5' 2.5" (1.588 m)   Wt 149 lb 3.2 oz (67.7 kg)   BMI 26.85 kg/m   Allergies  Allergen Reactions  . Celebrex [Celecoxib]   . Sulfa Antibiotics     Had sores in my mouth, bottom of my feet "the skin just came right off"    Physical Exam  Constitutional: She is oriented to person, place, and time. She appears well-developed and well-nourished.  HENT:    Head: Normocephalic and atraumatic.  Right Ear: Tympanic membrane, external ear and ear canal normal.  Left Ear: Tympanic membrane, external ear and ear canal normal.  Nose: Nose normal. No rhinorrhea.  Mouth/Throat: Oropharynx is clear and moist and mucous membranes are normal. No oropharyngeal exudate or posterior oropharyngeal erythema.  Eyes: Conjunctivae and EOM are normal. Pupils are equal, round, and reactive to light.  Neck: Normal range of motion. Neck supple.  Cardiovascular: Normal rate, regular rhythm, normal heart sounds and intact distal pulses.   Pulmonary/Chest: Effort normal and breath sounds normal.  Abdominal: Soft. Bowel sounds are normal.  Neurological: She is alert and oriented to person, place, and time. She has normal reflexes.  Skin: Skin is warm and dry. No rash noted.  Psychiatric: She has a normal mood and affect. Her behavior is normal. Judgment and thought content normal.    Results for orders placed or performed in visit on 10/10/16  CBC with Differential/Platelet  Result Value Ref Range   WBC 6.1 3.4 - 10.8 x10E3/uL   RBC 3.58 (L) 3.77 - 5.28 x10E6/uL   Hemoglobin 10.9 (L) 11.1 - 15.9 g/dL   Hematocrit 33.6 (L) 34.0 - 46.6 %   MCV 94 79 - 97 fL   MCH 30.4 26.6 - 33.0 pg   MCHC 32.4 31.5 - 35.7 g/dL   RDW 14.4 12.3 - 15.4 %   Platelets 264 150 - 379 x10E3/uL   Neutrophils 54 Not Estab. %   Lymphs 35 Not Estab. %   Monocytes 7 Not Estab. %   Eos 4 Not Estab. %   Basos 0 Not Estab. %   Neutrophils Absolute 3.3 1.4 - 7.0 x10E3/uL   Lymphocytes Absolute 2.1 0.7 - 3.1 x10E3/uL   Monocytes Absolute 0.4 0.1 - 0.9 x10E3/uL   EOS (ABSOLUTE) 0.2 0.0 - 0.4 x10E3/uL   Basophils Absolute 0.0 0.0 - 0.2 x10E3/uL   Immature Granulocytes 0 Not Estab. %   Immature Grans (Abs) 0.0 0.0 - 0.1 x10E3/uL  CMP14+EGFR  Result Value Ref Range   Glucose 88 65 - 99 mg/dL   BUN 25 8 - 27 mg/dL   Creatinine, Ser 1.23 (H) 0.57 - 1.00 mg/dL   GFR calc non Af Amer 43 (L)  >59 mL/min/1.73   GFR calc Af Amer 49 (L) >59 mL/min/1.73   BUN/Creatinine Ratio 20 12 - 28   Sodium 143 134 - 144 mmol/L   Potassium 4.3 3.5 - 5.2 mmol/L   Chloride 101 96 - 106 mmol/L   CO2 20 18 - 29 mmol/L   Calcium 9.3 8.7 - 10.3 mg/dL  Total Protein 7.3 6.0 - 8.5 g/dL   Albumin 4.5 3.5 - 4.8 g/dL   Globulin, Total 2.8 1.5 - 4.5 g/dL   Albumin/Globulin Ratio 1.6 1.2 - 2.2   Bilirubin Total 0.8 0.0 - 1.2 mg/dL   Alkaline Phosphatase 156 (H) 39 - 117 IU/L   AST 24 0 - 40 IU/L   ALT 14 0 - 32 IU/L  Microalbumin / creatinine urine ratio  Result Value Ref Range   Creatinine, Urine 100.0 Not Estab. mg/dL   Albumin, Urine 10.6 Not Estab. ug/mL   Microalb/Creat Ratio 10.6 0.0 - 30.0 mg/g creat      Assessment & Plan:   1. Anxiety - ALPRAZolam (XANAX) 0.25 MG tablet; Take 1 tablet (0.25 mg total) by mouth 2 (two) times daily.  Dispense: 45 tablet; Refill: 2  2. Gastroesophageal reflux disease without esophagitis - omeprazole (PRILOSEC) 40 MG capsule; Take 1 capsule (40 mg total) by mouth daily.  Dispense: 90 capsule; Refill: 3  3. Foot pain, bilateral - gabapentin (NEURONTIN) 300 MG capsule; Take 1 capsule (300 mg total) by mouth 3 (three) times daily.  Dispense: 90 capsule; Refill: 5 - lisinopril (PRINIVIL,ZESTRIL) 10 MG tablet; Take 1 tablet (10 mg total) by mouth daily.  Dispense: 90 tablet; Refill: 3   Continue all other maintenance medications as listed above.  Follow up plan: Return in about 3 months (around 02/21/2017) for recheck.  Educational handout given for knee pain  Terald Sleeper PA-C [facility] [address]  [place], [place] [address] [phone]   11/23/2016, 8:53 AM

## 2016-12-05 ENCOUNTER — Encounter: Payer: Self-pay | Admitting: Physician Assistant

## 2016-12-05 ENCOUNTER — Ambulatory Visit (INDEPENDENT_AMBULATORY_CARE_PROVIDER_SITE_OTHER): Payer: Medicare Other | Admitting: Physician Assistant

## 2016-12-05 VITALS — BP 130/71 | HR 80 | Temp 97.4°F | Ht 62.5 in | Wt 154.8 lb

## 2016-12-05 DIAGNOSIS — I10 Essential (primary) hypertension: Secondary | ICD-10-CM | POA: Diagnosis not present

## 2016-12-05 DIAGNOSIS — I878 Other specified disorders of veins: Secondary | ICD-10-CM

## 2016-12-05 MED ORDER — HYDROCHLOROTHIAZIDE 12.5 MG PO CAPS: 25.0000 mg | ORAL_CAPSULE | Freq: Every day | ORAL | 0 refills | Status: DC

## 2016-12-05 NOTE — Progress Notes (Signed)
BP 130/71   Pulse 80   Temp 97.4 F (36.3 C) (Oral)   Ht 5' 2.5" (1.588 m)   Wt 154 lb 12.8 oz (70.2 kg)   BMI 27.86 kg/m    Subjective:    Patient ID: Christie Bruce, female    DOB: 1941-05-09, 76 y.o.   MRN: [id number]  HPI: Christie Bruce is a 76 y.o. female presenting on 12/05/2016 for Leg Swelling (bilateral x 1 week)  Patient comes in today having increased edema and swelling in her lower legs. She has had issues with this in the past. She is only been taking 12.5 mg of hydrochlorothiazide. She has been trying to watch her diet and sodium. He states that she was up several days and notices that she has much more swelling when she has more activity where she is up. She had not been wearing compression stockings because she was concerned that they would come down over her knees and she felt that they were not fitting correctly. And instructed her that the compression is in the foot and ankle and that it relieves itself at the knee. When she puts on a new pair she can roll them down under the need to not be aggravated with them.  Relevant past medical, surgical, family and social history reviewed and updated as indicated. Allergies and medications reviewed and updated.  Past Medical History:  Diagnosis Date  . Asthma    small issue and uses inhaler if needed  . Breast cancer ([place])   . Chronic kidney disease   . 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 GERD (gastroesophageal reflux disease)   . GERD (gastroesophageal reflux disease) 09/12/2011  . Heart murmur   . Hypertension   . PONV (postoperative nausea and vomiting)   . Uterine prolapse     Past Surgical History:  Procedure Laterality Date  . bilateral mastectomy  07/19/10   bilat mastectomy for DCIS  . CHOLECYSTECTOMY N/A 02/13/2016   Procedure: LAPAROSCOPIC CHOLECYSTECTOMY;  Surgeon: Aviva Signs, MD;  Location: [facility];  Service: General;  Laterality: N/A;  . HIP SURGERY    . INTRAMEDULLARY (IM) NAIL  INTERTROCHANTERIC Left 12/28/2015   Procedure: INTRAMEDULLARY (IM) NAIL LEFT HIP;  Surgeon: Renette Butters, MD;  Location: [facility];  Service: Orthopedics;  Laterality: Left;  . KNEE SURGERY Left   . WRIST SURGERY Left     Review of Systems  Constitutional: Negative.  Negative for activity change, fatigue and fever.  HENT: Negative.   Eyes: Negative.   Respiratory: Negative.  Negative for cough.   Cardiovascular: Positive for leg swelling. Negative for chest pain and palpitations.  Gastrointestinal: Negative.  Negative for abdominal pain.  Endocrine: Negative.   Genitourinary: Negative.  Negative for dysuria.  Musculoskeletal: Negative.   Skin: Negative.  Negative for color change, pallor, rash and wound.  Neurological: Negative.     Allergies as of 12/05/2016      Reactions   Celebrex [celecoxib]    Sulfa Antibiotics    Had sores in my mouth, bottom of my feet "the skin just came right off"      Medication List       Accurate as of 12/05/16  1:00 PM. Always use your most recent med list.          ALPRAZolam 0.25 MG tablet Commonly known as:  XANAX Take 1 tablet (0.25 mg total) by mouth 2 (two) times daily.   budesonide-formoterol 80-4.5 MCG/ACT inhaler Commonly  known as:  SYMBICORT Inhale 2 puffs into the lungs 2 (two) times daily.   cetirizine 10 MG tablet Commonly known as:  ZYRTEC Take 1 tablet (10 mg total) by mouth daily as needed for allergies or rhinitis.   gabapentin 300 MG capsule Commonly known as:  NEURONTIN Take 1 capsule (300 mg total) by mouth 3 (three) times daily.   hydrochlorothiazide 12.5 MG capsule Commonly known as:  MICROZIDE Take 2 capsules (25 mg total) by mouth daily.   lisinopril 10 MG tablet Commonly known as:  PRINIVIL,ZESTRIL Take 1 tablet (10 mg total) by mouth daily.   omeprazole 40 MG capsule Commonly known as:  PRILOSEC Take 1 capsule (40 mg total) by mouth daily.          Objective:    BP 130/71   Pulse 80   Temp 97.4  F (36.3 C) (Oral)   Ht 5' 2.5" (1.588 m)   Wt 154 lb 12.8 oz (70.2 kg)   BMI 27.86 kg/m   Allergies  Allergen Reactions  . Celebrex [Celecoxib]   . Sulfa Antibiotics     Had sores in my mouth, bottom of my feet "the skin just came right off"    Physical Exam  Constitutional: She is oriented to person, place, and time. She appears well-developed and well-nourished.  HENT:  Head: Normocephalic and atraumatic.  Eyes: Conjunctivae and EOM are normal. Pupils are equal, round, and reactive to light.  Cardiovascular: Normal rate, regular rhythm, normal heart sounds and intact distal pulses.   1+ pitting edema in the lower legs on the pre-tibial area, mild edema over the top of her shoe. There is no severe skin changes at this time  Pulmonary/Chest: Effort normal and breath sounds normal.  Abdominal: Soft. Bowel sounds are normal.  Neurological: She is alert and oriented to person, place, and time. She has normal reflexes.  Skin: Skin is warm and dry. No rash noted. No erythema. No pallor.  Psychiatric: She has a normal mood and affect. Her behavior is normal. Judgment and thought content normal.  Vitals reviewed.   Results for orders placed or performed in visit on 10/10/16  CBC with Differential/Platelet  Result Value Ref Range   WBC 6.1 3.4 - 10.8 x10E3/uL   RBC 3.58 (L) 3.77 - 5.28 x10E6/uL   Hemoglobin 10.9 (L) 11.1 - 15.9 g/dL   Hematocrit 33.6 (L) 34.0 - 46.6 %   MCV 94 79 - 97 fL   MCH 30.4 26.6 - 33.0 pg   MCHC 32.4 31.5 - 35.7 g/dL   RDW 14.4 12.3 - 15.4 %   Platelets 264 150 - 379 x10E3/uL   Neutrophils 54 Not Estab. %   Lymphs 35 Not Estab. %   Monocytes 7 Not Estab. %   Eos 4 Not Estab. %   Basos 0 Not Estab. %   Neutrophils Absolute 3.3 1.4 - 7.0 x10E3/uL   Lymphocytes Absolute 2.1 0.7 - 3.1 x10E3/uL   Monocytes Absolute 0.4 0.1 - 0.9 x10E3/uL   EOS (ABSOLUTE) 0.2 0.0 - 0.4 x10E3/uL   Basophils Absolute 0.0 0.0 - 0.2 x10E3/uL   Immature Granulocytes 0 Not  Estab. %   Immature Grans (Abs) 0.0 0.0 - 0.1 x10E3/uL  CMP14+EGFR  Result Value Ref Range   Glucose 88 65 - 99 mg/dL   BUN 25 8 - 27 mg/dL   Creatinine, Ser 1.23 (H) 0.57 - 1.00 mg/dL   GFR calc non Af Amer 43 (L) >59 mL/min/1.73   GFR calc  Af Amer 49 (L) >59 mL/min/1.73   BUN/Creatinine Ratio 20 12 - 28   Sodium 143 134 - 144 mmol/L   Potassium 4.3 3.5 - 5.2 mmol/L   Chloride 101 96 - 106 mmol/L   CO2 20 18 - 29 mmol/L   Calcium 9.3 8.7 - 10.3 mg/dL   Total Protein 7.3 6.0 - 8.5 g/dL   Albumin 4.5 3.5 - 4.8 g/dL   Globulin, Total 2.8 1.5 - 4.5 g/dL   Albumin/Globulin Ratio 1.6 1.2 - 2.2   Bilirubin Total 0.8 0.0 - 1.2 mg/dL   Alkaline Phosphatase 156 (H) 39 - 117 IU/L   AST 24 0 - 40 IU/L   ALT 14 0 - 32 IU/L  Microalbumin / creatinine urine ratio  Result Value Ref Range   Creatinine, Urine 100.0 Not Estab. mg/dL   Albumin, Urine 10.6 Not Estab. ug/mL   Microalb/Creat Ratio 10.6 0.0 - 30.0 mg/g creat      Assessment & Plan:   1. Essential hypertension  2. Stasis, venous - hydrochlorothiazide (MICROZIDE) 12.5 MG capsule; Take 2 capsules (25 mg total) by mouth daily.  Dispense: 90 capsule; Refill: 0 Compression stockings 20-30 mmHg, knee-high, prescription with 1 refill is written and given to patient.   Current Outpatient Prescriptions:  .  ALPRAZolam (XANAX) 0.25 MG tablet, Take 1 tablet (0.25 mg total) by mouth 2 (two) times daily., Disp: 45 tablet, Rfl: 2 .  budesonide-formoterol (SYMBICORT) 80-4.5 MCG/ACT inhaler, Inhale 2 puffs into the lungs 2 (two) times daily. (Patient taking differently: Inhale 2 puffs into the lungs daily as needed (wheezing/shortness of breath). ), Disp: 2 Inhaler, Rfl: 0 .  cetirizine (ZYRTEC) 10 MG tablet, Take 1 tablet (10 mg total) by mouth daily as needed for allergies or rhinitis., Disp: 30 tablet, Rfl: 11 .  gabapentin (NEURONTIN) 300 MG capsule, Take 1 capsule (300 mg total) by mouth 3 (three) times daily., Disp: 90 capsule, Rfl: 5 .   hydrochlorothiazide (MICROZIDE) 12.5 MG capsule, Take 2 capsules (25 mg total) by mouth daily., Disp: 90 capsule, Rfl: 0 .  lisinopril (PRINIVIL,ZESTRIL) 10 MG tablet, Take 1 tablet (10 mg total) by mouth daily., Disp: 90 tablet, Rfl: 3 .  omeprazole (PRILOSEC) 40 MG capsule, Take 1 capsule (40 mg total) by mouth daily., Disp: 90 capsule, Rfl: 3  Continue all other maintenance medications as listed above.  Follow up plan: Return in about 6 weeks (around 01/16/2017) for recheck.  Educational handout given for venous stasis  Terald Sleeper PA-C [facility] [address]  [place], [place] [address] [phone]   12/05/2016, 1:00 PM

## 2016-12-05 NOTE — Patient Instructions (Signed)

## 2017-01-17 ENCOUNTER — Other Ambulatory Visit: Payer: Self-pay | Admitting: Physician Assistant

## 2017-01-17 DIAGNOSIS — F419 Anxiety disorder, unspecified: Secondary | ICD-10-CM

## 2017-01-18 NOTE — Telephone Encounter (Signed)
[facility] per Particia Nearing

## 2017-02-08 ENCOUNTER — Ambulatory Visit (INDEPENDENT_AMBULATORY_CARE_PROVIDER_SITE_OTHER): Payer: Medicare Other | Admitting: Obstetrics & Gynecology

## 2017-02-08 ENCOUNTER — Encounter: Payer: Self-pay | Admitting: Obstetrics & Gynecology

## 2017-02-08 VITALS — BP 170/82 | HR 78 | Ht 62.5 in | Wt 159.0 lb

## 2017-02-08 DIAGNOSIS — N813 Complete uterovaginal prolapse: Secondary | ICD-10-CM | POA: Diagnosis not present

## 2017-02-08 DIAGNOSIS — Z4689 Encounter for fitting and adjustment of other specified devices: Secondary | ICD-10-CM | POA: Diagnosis not present

## 2017-02-08 NOTE — Progress Notes (Signed)
Chief Complaint  Patient presents with  . Pessary cleaning    Blood pressure (!) 170/82, pulse 78, height 5' 2.5" (1.588 m), weight 159 lb (72.1 kg).  Christie Bruce presents today for routine follow up related to her pessary.   She uses a Milex ring #2 She reports no vaginal discharge or vaginal bleeding.  Exam reveals no undue vaginal mucosal pressure of breakdown, no discharge and no vaginal bleeding.  The pessary is removed, cleaned and replaced without difficulty.    Christie Bruce will be sen back in 4 months for continued follow up.  Florian Buff, MD  02/08/2017 9:57 AM

## 2017-03-15 ENCOUNTER — Other Ambulatory Visit: Payer: Self-pay | Admitting: Physician Assistant

## 2017-03-15 DIAGNOSIS — F419 Anxiety disorder, unspecified: Secondary | ICD-10-CM

## 2017-03-18 NOTE — Telephone Encounter (Signed)
Last seen 12/05/16  Glenard Haring  If  Approved route to nurse to call into [facility]

## 2017-03-19 NOTE — Telephone Encounter (Signed)
Rx phoned in.   

## 2017-03-22 ENCOUNTER — Other Ambulatory Visit: Payer: Self-pay | Admitting: Physician Assistant

## 2017-03-22 DIAGNOSIS — M79672 Pain in left foot: Principal | ICD-10-CM

## 2017-03-22 DIAGNOSIS — M79671 Pain in right foot: Secondary | ICD-10-CM

## 2017-04-05 ENCOUNTER — Encounter: Payer: Self-pay | Admitting: Physician Assistant

## 2017-04-05 ENCOUNTER — Ambulatory Visit (INDEPENDENT_AMBULATORY_CARE_PROVIDER_SITE_OTHER): Payer: Medicare Other | Admitting: Physician Assistant

## 2017-04-05 VITALS — BP 133/63 | HR 78 | Temp 97.7°F | Ht 62.5 in | Wt 160.6 lb

## 2017-04-05 DIAGNOSIS — J45909 Unspecified asthma, uncomplicated: Secondary | ICD-10-CM | POA: Diagnosis not present

## 2017-04-05 DIAGNOSIS — I10 Essential (primary) hypertension: Secondary | ICD-10-CM | POA: Diagnosis not present

## 2017-04-05 DIAGNOSIS — I878 Other specified disorders of veins: Secondary | ICD-10-CM | POA: Diagnosis not present

## 2017-04-05 DIAGNOSIS — L03011 Cellulitis of right finger: Secondary | ICD-10-CM

## 2017-04-05 MED ORDER — CEPHALEXIN 500 MG PO CAPS: 500.0000 mg | ORAL_CAPSULE | Freq: Three times a day (TID) | ORAL | 0 refills | Status: DC

## 2017-04-05 MED ORDER — HYDROCHLOROTHIAZIDE 25 MG PO TABS: 25.0000 mg | ORAL_TABLET | Freq: Every day | ORAL | 3 refills | Status: DC

## 2017-04-05 NOTE — Patient Instructions (Signed)
In a few days you may receive a survey in the mail or online from [employer] regarding your visit with us today. Please take a moment to fill this out. Your feedback is very important to our whole office. It can help us better understand your needs as well as improve your experience and satisfaction. Thank you for taking your time to complete it. We care about you.  Delpha Perko, PA-C  

## 2017-04-05 NOTE — Progress Notes (Signed)
BP 133/63   Pulse 78   Temp 97.7 F (36.5 C) (Oral)   Ht 5' 2.5" (1.588 m)   Wt 160 lb 9.6 oz (72.8 kg)   BMI 28.91 kg/m    Subjective:    Patient ID: Christie Bruce, female    DOB: [birth year], 76 y.o.   MRN: [id number]  HPI: Christie Bruce is a 76 y.o. female presenting on 04/05/2017 for Hand Pain (right finger red and infected)  A little over week ago patient cut her finger on a piece of glass. Her family helped herget it closed and Steri-Strips. It has healed but she now has redness all around the distal joint. She has arthritis in this joint already.  She also needs a refill on her hydrochlorothiazide. She is tolerating the medication well and her blood pressure is very good today.   Relevant past medical, surgical, family and social history reviewed and updated as indicated. Allergies and medications reviewed and updated.  Past Medical History:  Diagnosis Date  . Asthma    small issue and uses inhaler if needed  . Breast cancer ([place])   . Chronic kidney disease   . 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 GERD (gastroesophageal reflux disease)   . GERD (gastroesophageal reflux disease) 09/12/2011  . Heart murmur   . Hypertension   . PONV (postoperative nausea and vomiting)   . Uterine prolapse     Past Surgical History:  Procedure Laterality Date  . bilateral mastectomy  07/19/10   bilat mastectomy for DCIS  . CHOLECYSTECTOMY N/A 02/13/2016   Procedure: LAPAROSCOPIC CHOLECYSTECTOMY;  Surgeon: Aviva Signs, MD;  Location: [facility];  Service: General;  Laterality: N/A;  . HIP SURGERY    . INTRAMEDULLARY (IM) NAIL INTERTROCHANTERIC Left 12/28/2015   Procedure: INTRAMEDULLARY (IM) NAIL LEFT HIP;  Surgeon: Renette Butters, MD;  Location: [place];  Service: Orthopedics;  Laterality: Left;  . KNEE SURGERY Left   . WRIST SURGERY Left     Review of Systems  Constitutional: Negative.  Negative for activity change, fatigue and fever.  HENT: Negative.   Eyes: Negative.    Respiratory: Negative.  Negative for cough.   Cardiovascular: Negative.  Negative for chest pain.  Gastrointestinal: Negative.  Negative for abdominal pain.  Endocrine: Negative.   Genitourinary: Negative.  Negative for dysuria.  Musculoskeletal: Negative.   Skin: Positive for color change and wound.  Neurological: Negative.     Allergies as of 04/05/2017      Reactions   Celebrex [celecoxib]    Sulfa Antibiotics    Had sores in my mouth, bottom of my feet "the skin just came right off"      Medication List       Accurate as of 04/05/17 12:40 PM. Always use your most recent med list.          ALPRAZolam 0.25 MG tablet Commonly known as:  XANAX TAKE ONE TABLET BY MOUTH TWICE DAILY   budesonide-formoterol 80-4.5 MCG/ACT inhaler Commonly known as:  SYMBICORT Inhale 2 puffs into the lungs 2 (two) times daily.   cephALEXin 500 MG capsule Commonly known as:  KEFLEX Take 1 capsule (500 mg total) by mouth 3 (three) times daily.   cetirizine 10 MG tablet Commonly known as:  ZYRTEC Take 1 tablet (10 mg total) by mouth daily as needed for allergies or rhinitis.   gabapentin 300 MG capsule Commonly known as:  NEURONTIN TAKE ONE (1) CAPSULE THREE (3) TIMES EACH  DAY   hydrochlorothiazide 25 MG tablet Commonly known as:  HYDRODIURIL Take 1 tablet (25 mg total) by mouth daily.   lisinopril 10 MG tablet Commonly known as:  PRINIVIL,ZESTRIL Take 1 tablet (10 mg total) by mouth daily.   omeprazole 40 MG capsule Commonly known as:  PRILOSEC Take 1 capsule (40 mg total) by mouth daily.   TYLENOL ARTHRITIS PAIN PO Take by mouth daily.            Discharge Care Instructions        Start     Ordered   04/05/17 0000  hydrochlorothiazide (HYDRODIURIL) 25 MG tablet  Daily    Question:  Supervising Provider  Answer:  Timmothy Euler   04/05/17 1222   04/05/17 0000  cephALEXin (KEFLEX) 500 MG capsule  3 times daily    Question:  Supervising Provider  Answer:   Timmothy Euler   04/05/17 1222         Objective:    BP 133/63   Pulse 78   Temp 97.7 F (36.5 C) (Oral)   Ht 5' 2.5" (1.588 m)   Wt 160 lb 9.6 oz (72.8 kg)   BMI 28.91 kg/m   Allergies  Allergen Reactions  . Celebrex [Celecoxib]   . Sulfa Antibiotics     Had sores in my mouth, bottom of my feet "the skin just came right off"    Physical Exam  Constitutional: She is oriented to person, place, and time. She appears well-developed and well-nourished.  HENT:  Head: Normocephalic and atraumatic.  Eyes: Pupils are equal, round, and reactive to light. Conjunctivae and EOM are normal.  Cardiovascular: Normal rate, regular rhythm, normal heart sounds and intact distal pulses.   Pulmonary/Chest: Effort normal and breath sounds normal.  Abdominal: Soft. Bowel sounds are normal.  Neurological: She is alert and oriented to person, place, and time. She has normal reflexes.  Skin: Skin is warm and dry. Laceration noted. No rash noted. There is erythema.  Redness in the distal phalange  Psychiatric: She has a normal mood and affect. Her behavior is normal. Judgment and thought content normal.        Assessment & Plan:   1. Stasis, venous  2. Cellulitis of finger of right hand - cephALEXin (KEFLEX) 500 MG capsule; Take 1 capsule (500 mg total) by mouth 3 (three) times daily.  Dispense: 30 capsule; Refill: 0  3. Intrinsic asthma  4. Essential hypertension - hydrochlorothiazide (HYDRODIURIL) 25 MG tablet; Take 1 tablet (25 mg total) by mouth daily.  Dispense: 90 tablet; Refill: 3    Current Outpatient Prescriptions:  .  Acetaminophen (TYLENOL ARTHRITIS PAIN PO), Take by mouth daily., Disp: , Rfl:  .  ALPRAZolam (XANAX) 0.25 MG tablet, TAKE ONE TABLET BY MOUTH TWICE DAILY, Disp: 60 tablet, Rfl: 0 .  budesonide-formoterol (SYMBICORT) 80-4.5 MCG/ACT inhaler, Inhale 2 puffs into the lungs 2 (two) times daily. (Patient taking differently: Inhale 2 puffs into the lungs daily as  needed (wheezing/shortness of breath). ), Disp: 2 Inhaler, Rfl: 0 .  cetirizine (ZYRTEC) 10 MG tablet, Take 1 tablet (10 mg total) by mouth daily as needed for allergies or rhinitis., Disp: 30 tablet, Rfl: 11 .  gabapentin (NEURONTIN) 300 MG capsule, TAKE ONE (1) CAPSULE THREE (3) TIMES EACH DAY, Disp: 270 capsule, Rfl: 0 .  lisinopril (PRINIVIL,ZESTRIL) 10 MG tablet, Take 1 tablet (10 mg total) by mouth daily., Disp: 90 tablet, Rfl: 3 .  omeprazole (PRILOSEC) 40 MG capsule, Take 1  capsule (40 mg total) by mouth daily., Disp: 90 capsule, Rfl: 3 .  cephALEXin (KEFLEX) 500 MG capsule, Take 1 capsule (500 mg total) by mouth 3 (three) times daily., Disp: 30 capsule, Rfl: 0 .  hydrochlorothiazide (HYDRODIURIL) 25 MG tablet, Take 1 tablet (25 mg total) by mouth daily., Disp: 90 tablet, Rfl: 3 Continue all other maintenance medications as listed above.  Follow up plan: Return in about 3 months (around 07/05/2017) for recheck.  Educational handout given for [place] PA-C [place] [address]  [place], [place] [address] [phone]   04/05/2017, 12:40 PM

## 2017-04-11 ENCOUNTER — Telehealth: Payer: Self-pay | Admitting: Physician Assistant

## 2017-04-12 ENCOUNTER — Encounter: Payer: Self-pay | Admitting: Nurse Practitioner

## 2017-04-12 ENCOUNTER — Ambulatory Visit (INDEPENDENT_AMBULATORY_CARE_PROVIDER_SITE_OTHER): Payer: Medicare Other | Admitting: Nurse Practitioner

## 2017-04-12 VITALS — BP 126/68 | HR 71 | Temp 97.3°F | Ht 62.0 in | Wt 159.0 lb

## 2017-04-12 DIAGNOSIS — M7989 Other specified soft tissue disorders: Secondary | ICD-10-CM

## 2017-04-12 MED ORDER — COLCHICINE 0.6 MG PO TABS: ORAL_TABLET | ORAL | 0 refills | Status: DC

## 2017-04-12 NOTE — Progress Notes (Signed)
   Subjective:    Patient ID: Christie Bruce, female    DOB: [birth year], 76 y.o.   MRN: [id number]  HPI Patient comes in today c/o right index and middle finger swelling. She cut her right index finger several weeks ago and it became sore and red. She came in and saw A. Ronnald Ramp ,PA and was given antibiotic for cellulitis. Since completing antibiotic the distal PIP joint of right index finger is still red swollen and tender to touch and now right middle finger PIP joint is red and swollen and tender to touch.    Review of Systems  Respiratory: Negative.   Cardiovascular: Negative.   Musculoskeletal: Positive for arthralgias.  Neurological: Negative.   Psychiatric/Behavioral: Negative.   All other systems reviewed and are negative.      Objective:   Physical Exam  Constitutional: She is oriented to person, place, and time. She appears well-developed and well-nourished. She appears distressed (mild).  Cardiovascular: Normal rate.   Pulmonary/Chest: Effort normal and breath sounds normal.  Musculoskeletal:  Distal PIP joint of right index and middles finger are tender to touch, warm and erythematous.  Neurological: She is alert and oriented to person, place, and time.  Skin: Skin is warm.  Psychiatric: She has a normal mood and affect. Her behavior is normal. Judgment and thought content normal.   BP 126/68   Pulse 71   Temp (!) 97.3 F (36.3 C) (Oral)   Ht 5\' 2"  (1.575 m)   Wt 159 lb (72.1 kg)   BMI 29.08 kg/m         Assessment & Plan:   1. Swelling of right index finger   2. Swelling of right middle finger    Probably gout Labs pending Meds ordered this encounter  Medications  . colchicine 0.6 MG tablet    Sig: 1 po now and may repeat x1 in 2 hours if still hurting. No more then 2 tablets in a day    Dispense:  20 tablet    Refill:  0    Order Specific Question:   Supervising Provider    Answer:   Eustaquio Maize [[id number]]    Mary-Margaret Hassell Done, FNP

## 2017-04-12 NOTE — Patient Instructions (Signed)

## 2017-04-13 LAB — ARTHRITIS PANEL
BASOS ABS: 0 10*3/uL (ref 0.0–0.2)
BASOS: 0 %
EOS (ABSOLUTE): 0.2 10*3/uL (ref 0.0–0.4)
EOS: 3 %
Hematocrit: 30.7 % — ABNORMAL LOW (ref 34.0–46.6)
Hemoglobin: 9.7 g/dL — ABNORMAL LOW (ref 11.1–15.9)
IMMATURE GRANS (ABS): 0 10*3/uL (ref 0.0–0.1)
IMMATURE GRANULOCYTES: 0 %
LYMPHS: 40 %
Lymphocytes Absolute: 2.6 10*3/uL (ref 0.7–3.1)
MCH: 29.4 pg (ref 26.6–33.0)
MCHC: 31.6 g/dL (ref 31.5–35.7)
MCV: 93 fL (ref 79–97)
MONOCYTES: 6 %
MONOS ABS: 0.4 10*3/uL (ref 0.1–0.9)
Neutrophils Absolute: 3.2 10*3/uL (ref 1.4–7.0)
Neutrophils: 51 %
PLATELETS: 260 10*3/uL (ref 150–379)
RBC: 3.3 x10E6/uL — AB (ref 3.77–5.28)
RDW: 14.1 % (ref 12.3–15.4)
Rhuematoid fact SerPl-aCnc: <10 IU/mL (ref 0.0–13.9)
SED RATE: 18 mm/h (ref 0–40)
Uric Acid: 10.3 mg/dL — ABNORMAL HIGH (ref 2.5–7.1)
WBC: 6.5 10*3/uL (ref 3.4–10.8)

## 2017-04-18 NOTE — Telephone Encounter (Signed)
No response back from pt -note closed

## 2017-04-29 ENCOUNTER — Other Ambulatory Visit: Payer: Self-pay | Admitting: Physician Assistant

## 2017-04-29 DIAGNOSIS — F419 Anxiety disorder, unspecified: Secondary | ICD-10-CM

## 2017-04-29 NOTE — Telephone Encounter (Signed)
Phoned in.

## 2017-05-09 ENCOUNTER — Other Ambulatory Visit: Payer: Self-pay | Admitting: Nurse Practitioner

## 2017-05-09 DIAGNOSIS — M7989 Other specified soft tissue disorders: Secondary | ICD-10-CM

## 2017-06-04 ENCOUNTER — Encounter: Payer: Self-pay | Admitting: Physician Assistant

## 2017-06-04 ENCOUNTER — Ambulatory Visit: Payer: Medicare Other | Admitting: Physician Assistant

## 2017-06-04 VITALS — BP 136/71 | HR 84 | Temp 97.3°F | Ht 62.0 in | Wt 162.2 lb

## 2017-06-04 DIAGNOSIS — J189 Pneumonia, unspecified organism: Secondary | ICD-10-CM

## 2017-06-04 MED ORDER — DOXYCYCLINE HYCLATE 100 MG PO TABS: 100.0000 mg | ORAL_TABLET | Freq: Two times a day (BID) | ORAL | 0 refills | Status: DC

## 2017-06-04 MED ORDER — SPACER/AERO CHAMBER MOUTHPIECE MISC: 1.0000 [IU] | Freq: Four times a day (QID) | 0 refills | Status: AC

## 2017-06-04 MED ORDER — ALBUTEROL SULFATE HFA 108 (90 BASE) MCG/ACT IN AERS: 2.0000 | INHALATION_SPRAY | Freq: Four times a day (QID) | RESPIRATORY_TRACT | 0 refills | Status: DC | PRN

## 2017-06-04 MED ORDER — PREDNISONE 10 MG (21) PO TBPK: ORAL_TABLET | ORAL | 0 refills | Status: DC

## 2017-06-04 NOTE — Patient Instructions (Signed)
In a few days you may receive a survey in the mail or online from [employer] regarding your visit with us today. Please take a moment to fill this out. Your feedback is very important to our whole office. It can help us better understand your needs as well as improve your experience and satisfaction. Thank you for taking your time to complete it. We care about you.  Charron Coultas, PA-C  

## 2017-06-04 NOTE — Progress Notes (Signed)
BP 136/71   Pulse 84   Temp (!) 97.3 F (36.3 C) (Oral)   Ht 5\' 2"  (1.575 m)   Wt 162 lb 3.2 oz (73.6 kg)   SpO2 97%   BMI 29.67 kg/m    Subjective:    Patient ID: Christie Bruce, female    DOB: 22-Dec-1940, 76 y.o.   MRN: [id number]  HPI: Christie Bruce is a 76 y.o. female presenting on 06/04/2017 for Cough and Wheezing  Patient with several days of progressing upper respiratory and bronchial symptoms. Initially there was more upper respiratory congestion. This progressed to having significant cough that is productive throughout the day and severe at night. There is occasional wheezing after coughing. Sometimes there is slight dyspnea on exertion. It is productive mucus that is yellow in color. Denies any blood.   Relevant past medical, surgical, family and social history reviewed and updated as indicated. Allergies and medications reviewed and updated.  Past Medical History:  Diagnosis Date  . Asthma    small issue and uses inhaler if needed  . Breast cancer ([place])   . Chronic kidney disease   . 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 GERD (gastroesophageal reflux disease)   . GERD (gastroesophageal reflux disease) 09/12/2011  . Heart murmur   . Hypertension   . PONV (postoperative nausea and vomiting)   . Uterine prolapse     Past Surgical History:  Procedure Laterality Date  . bilateral mastectomy  07/19/10   bilat mastectomy for DCIS  . HIP SURGERY    . KNEE SURGERY Left   . WRIST SURGERY Left     Review of Systems  Constitutional: Positive for chills and fatigue. Negative for activity change, appetite change and fever.  HENT: Positive for congestion, postnasal drip and sore throat.   Eyes: Negative.   Respiratory: Positive for cough and wheezing. Negative for shortness of breath.   Cardiovascular: Negative.  Negative for chest pain, palpitations and leg swelling.  Gastrointestinal: Negative.   Genitourinary: Negative.   Musculoskeletal: Negative.    Skin: Negative.   Neurological: Positive for headaches.    Allergies as of 06/04/2017      Reactions   Celebrex [celecoxib]    Sulfa Antibiotics    Had sores in my mouth, bottom of my feet "the skin just came right off"      Medication List        Accurate as of 06/04/17  5:00 PM. Always use your most recent med list.          albuterol 108 (90 Base) MCG/ACT inhaler Commonly known as:  PROVENTIL HFA;VENTOLIN HFA Inhale 2 puffs every 6 (six) hours as needed into the lungs for wheezing or shortness of breath.   ALPRAZolam 0.25 MG tablet Commonly known as:  XANAX TAKE ONE TABLET BY MOUTH TWICE DAILY   budesonide-formoterol 80-4.5 MCG/ACT inhaler Commonly known as:  SYMBICORT Inhale 2 puffs into the lungs 2 (two) times daily.   cetirizine 10 MG tablet Commonly known as:  ZYRTEC Take 1 tablet (10 mg total) by mouth daily as needed for allergies or rhinitis.   colchicine 0.6 MG tablet TAKE 1 TABLET NOW, MAY REPEAT IN 2 HOURSIF STILL HURTING. NO MORE THAN 2 TABLETS IN A DAY   doxycycline 100 MG tablet Commonly known as:  VIBRA-TABS Take 1 tablet (100 mg total) 2 (two) times daily by mouth. 1 po bid   gabapentin 300 MG capsule Commonly known as:  NEURONTIN TAKE  ONE (1) CAPSULE THREE (3) TIMES EACH DAY   hydrochlorothiazide 25 MG tablet Commonly known as:  HYDRODIURIL Take 1 tablet (25 mg total) by mouth daily.   lisinopril 10 MG tablet Commonly known as:  PRINIVIL,ZESTRIL Take 1 tablet (10 mg total) by mouth daily.   omeprazole 40 MG capsule Commonly known as:  PRILOSEC Take 1 capsule (40 mg total) by mouth daily.   predniSONE 10 MG (21) Tbpk tablet Commonly known as:  STERAPRED UNI-PAK 21 TAB As directed x 6 days   Spacer/Aero Chamber Mouthpiece Misc 1 Units 4 (four) times daily by Does not apply route.   TYLENOL ARTHRITIS PAIN PO Take by mouth daily.          Objective:    BP 136/71   Pulse 84   Temp (!) 97.3 F (36.3 C) (Oral)   Ht 5\' 2"   (1.575 m)   Wt 162 lb 3.2 oz (73.6 kg)   SpO2 97%   BMI 29.67 kg/m   Allergies  Allergen Reactions  . Celebrex [Celecoxib]   . Sulfa Antibiotics     Had sores in my mouth, bottom of my feet "the skin just came right off"    Physical Exam  Constitutional: She is oriented to person, place, and time. She appears well-developed and well-nourished.  HENT:  Head: Normocephalic and atraumatic.  Right Ear: There is drainage and tenderness.  Left Ear: There is drainage and tenderness.  Nose: Mucosal edema and rhinorrhea present. Right sinus exhibits maxillary sinus tenderness and frontal sinus tenderness. Left sinus exhibits maxillary sinus tenderness and frontal sinus tenderness.  Mouth/Throat: Oropharyngeal exudate and posterior oropharyngeal erythema present.  Eyes: Conjunctivae and EOM are normal. Pupils are equal, round, and reactive to light.  Neck: Normal range of motion. Neck supple.  Cardiovascular: Normal rate, regular rhythm, normal heart sounds and intact distal pulses.  Pulmonary/Chest: Effort normal. She has wheezes in the right upper field and the left upper field.  Abdominal: Soft. Bowel sounds are normal.  Neurological: She is alert and oriented to person, place, and time. She has normal reflexes.  Skin: Skin is warm and dry. No rash noted.  Psychiatric: She has a normal mood and affect. Her behavior is normal. Judgment and thought content normal.  Nursing note and vitals reviewed.       Assessment & Plan:   1. Atypical pneumonia    Current Outpatient Medications:  .  Acetaminophen (TYLENOL ARTHRITIS PAIN PO), Take by mouth daily., Disp: , Rfl:  .  albuterol (PROVENTIL HFA;VENTOLIN HFA) 108 (90 Base) MCG/ACT inhaler, Inhale 2 puffs every 6 (six) hours as needed into the lungs for wheezing or shortness of breath., Disp: 1 Inhaler, Rfl: 0 .  ALPRAZolam (XANAX) 0.25 MG tablet, TAKE ONE TABLET BY MOUTH TWICE DAILY, Disp: 60 tablet, Rfl: 1 .  budesonide-formoterol  (SYMBICORT) 80-4.5 MCG/ACT inhaler, Inhale 2 puffs into the lungs 2 (two) times daily. (Patient taking differently: Inhale 2 puffs into the lungs daily as needed (wheezing/shortness of breath). ), Disp: 2 Inhaler, Rfl: 0 .  cetirizine (ZYRTEC) 10 MG tablet, Take 1 tablet (10 mg total) by mouth daily as needed for allergies or rhinitis., Disp: 30 tablet, Rfl: 11 .  colchicine 0.6 MG tablet, TAKE 1 TABLET NOW, MAY REPEAT IN 2 HOURSIF STILL HURTING. NO MORE THAN 2 TABLETS IN A DAY, Disp: 20 tablet, Rfl: 0 .  doxycycline (VIBRA-TABS) 100 MG tablet, Take 1 tablet (100 mg total) 2 (two) times daily by mouth. 1 po bid,  Disp: 20 tablet, Rfl: 0 .  gabapentin (NEURONTIN) 300 MG capsule, TAKE ONE (1) CAPSULE THREE (3) TIMES EACH DAY, Disp: 270 capsule, Rfl: 0 .  hydrochlorothiazide (HYDRODIURIL) 25 MG tablet, Take 1 tablet (25 mg total) by mouth daily., Disp: 90 tablet, Rfl: 3 .  lisinopril (PRINIVIL,ZESTRIL) 10 MG tablet, Take 1 tablet (10 mg total) by mouth daily., Disp: 90 tablet, Rfl: 3 .  omeprazole (PRILOSEC) 40 MG capsule, Take 1 capsule (40 mg total) by mouth daily., Disp: 90 capsule, Rfl: 3 .  predniSONE (STERAPRED UNI-PAK 21 TAB) 10 MG (21) TBPK tablet, As directed x 6 days, Disp: 21 tablet, Rfl: 0 .  Spacer/Aero Chamber Mouthpiece MISC, 1 Units 4 (four) times daily by Does not apply route., Disp: 1 each, Rfl: 0 Continue all other maintenance medications as listed above.  Follow up plan: Follow-up as needed or worsening of symptoms. Call office for any issues.   Educational handout given for [place] PA-C [place] [address]  [place], [place] [address] [phone]   06/04/2017, 5:00 PM

## 2017-06-17 ENCOUNTER — Other Ambulatory Visit: Payer: Self-pay | Admitting: Physician Assistant

## 2017-06-17 DIAGNOSIS — M7989 Other specified soft tissue disorders: Secondary | ICD-10-CM

## 2017-06-18 ENCOUNTER — Ambulatory Visit: Payer: Medicare Other | Admitting: Obstetrics & Gynecology

## 2017-07-11 ENCOUNTER — Other Ambulatory Visit: Payer: Self-pay

## 2017-07-11 ENCOUNTER — Ambulatory Visit: Payer: Medicare Other | Admitting: Obstetrics & Gynecology

## 2017-07-11 ENCOUNTER — Encounter: Payer: Self-pay | Admitting: Obstetrics & Gynecology

## 2017-07-11 VITALS — BP 148/80 | HR 88 | Ht 62.0 in | Wt 162.0 lb

## 2017-07-11 DIAGNOSIS — N813 Complete uterovaginal prolapse: Secondary | ICD-10-CM

## 2017-07-11 DIAGNOSIS — Z4689 Encounter for fitting and adjustment of other specified devices: Secondary | ICD-10-CM | POA: Diagnosis not present

## 2017-07-20 ENCOUNTER — Other Ambulatory Visit: Payer: Self-pay | Admitting: Physician Assistant

## 2017-07-20 DIAGNOSIS — F419 Anxiety disorder, unspecified: Secondary | ICD-10-CM

## 2017-07-24 ENCOUNTER — Other Ambulatory Visit: Payer: Self-pay | Admitting: Physician Assistant

## 2017-07-24 DIAGNOSIS — M7989 Other specified soft tissue disorders: Secondary | ICD-10-CM

## 2017-07-24 NOTE — Telephone Encounter (Signed)
last seen 11./13/18  Christie Bruce

## 2017-07-29 ENCOUNTER — Ambulatory Visit (INDEPENDENT_AMBULATORY_CARE_PROVIDER_SITE_OTHER): Payer: Medicare Other | Admitting: *Deleted

## 2017-07-29 DIAGNOSIS — Z23 Encounter for immunization: Secondary | ICD-10-CM

## 2017-08-06 ENCOUNTER — Encounter: Payer: Self-pay | Admitting: Physician Assistant

## 2017-08-06 ENCOUNTER — Ambulatory Visit: Payer: Medicare Other | Admitting: Physician Assistant

## 2017-08-06 VITALS — BP 124/71 | HR 89 | Temp 97.3°F | Ht 62.0 in | Wt 162.6 lb

## 2017-08-06 DIAGNOSIS — J4520 Mild intermittent asthma, uncomplicated: Secondary | ICD-10-CM | POA: Diagnosis not present

## 2017-08-06 DIAGNOSIS — J309 Allergic rhinitis, unspecified: Secondary | ICD-10-CM

## 2017-08-06 DIAGNOSIS — J45909 Unspecified asthma, uncomplicated: Secondary | ICD-10-CM

## 2017-08-06 DIAGNOSIS — J40 Bronchitis, not specified as acute or chronic: Secondary | ICD-10-CM | POA: Diagnosis not present

## 2017-08-06 MED ORDER — LORATADINE 10 MG PO TABS: 10.0000 mg | ORAL_TABLET | Freq: Every day | ORAL | 11 refills | Status: DC

## 2017-08-06 MED ORDER — AMOXICILLIN 500 MG PO CAPS: 1000.0000 mg | ORAL_CAPSULE | Freq: Two times a day (BID) | ORAL | 0 refills | Status: DC

## 2017-08-06 MED ORDER — IPRATROPIUM-ALBUTEROL 0.5-2.5 (3) MG/3ML IN SOLN: 3.0000 mL | Freq: Four times a day (QID) | RESPIRATORY_TRACT | 6 refills | Status: DC | PRN

## 2017-08-06 MED ORDER — METHYLPREDNISOLONE ACETATE 80 MG/ML IJ SUSP
80.0000 mg | Freq: Once | INTRAMUSCULAR | Status: AC
  Administered 2017-08-06: 80 mg via INTRAMUSCULAR

## 2017-08-06 MED ORDER — FLUTICASONE PROPIONATE 50 MCG/ACT NA SUSP: 1.0000 | Freq: Two times a day (BID) | NASAL | 11 refills | Status: DC

## 2017-08-06 NOTE — Progress Notes (Signed)
BP 124/71   Pulse 89   Temp (!) 97.3 F (36.3 C) (Oral)   Ht 5\' 2"  (1.575 m)   Wt 162 lb 9.6 oz (73.8 kg)   SpO2 97%   BMI 29.74 kg/m    Subjective:    Patient ID: Christie Bruce, female    DOB: 1941-01-12, 77 y.o.   MRN: [id number]  HPI: Christie Bruce is a 77 y.o. female presenting on 08/06/2017 for Cough and Wheezing  Patient with several days of progressing upper respiratory and bronchial symptoms. Initially there was more upper respiratory congestion. This progressed to having significant cough that is productive throughout the day and severe at night. There is occasional wheezing after coughing. Sometimes there is slight dyspnea on exertion. It is productive mucus that is yellow in color. Denies any blood.  She has known asthma that has not been very flared up.  It is most bothersome when she is sick.  There has been some difficulty in using her handheld meter device.  She is not been able to use it even with a spacer.  Relevant past medical, surgical, family and social history reviewed and updated as indicated. Allergies and medications reviewed and updated.  Past Medical History:  Diagnosis Date  . Asthma    small issue and uses inhaler if needed  . Breast cancer ([place])   . Chronic kidney disease   . 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 GERD (gastroesophageal reflux disease)   . GERD (gastroesophageal reflux disease) 09/12/2011  . Gout   . Heart murmur   . Hypertension   . PONV (postoperative nausea and vomiting)   . Uterine prolapse     Past Surgical History:  Procedure Laterality Date  . bilateral mastectomy  07/19/10   bilat mastectomy for DCIS  . CHOLECYSTECTOMY N/A 02/13/2016   Procedure: LAPAROSCOPIC CHOLECYSTECTOMY;  Surgeon: Aviva Signs, MD;  Location: [facility];  Service: General;  Laterality: N/A;  . HIP SURGERY    . INTRAMEDULLARY (IM) NAIL INTERTROCHANTERIC Left 12/28/2015   Procedure: INTRAMEDULLARY (IM) NAIL LEFT HIP;  Surgeon: Renette Butters, MD;  Location: [facility];  Service: Orthopedics;  Laterality: Left;  . KNEE SURGERY Left   . WRIST SURGERY Left     Review of Systems  Constitutional: Positive for chills and fatigue. Negative for activity change, appetite change and fever.  HENT: Positive for congestion, postnasal drip, sinus pain and sore throat.   Eyes: Negative.   Respiratory: Positive for cough, shortness of breath and wheezing.   Cardiovascular: Negative.  Negative for chest pain, palpitations and leg swelling.  Gastrointestinal: Negative.   Genitourinary: Negative.   Musculoskeletal: Negative.   Skin: Negative.   Neurological: Positive for headaches.    Allergies as of 08/06/2017      Reactions   Celebrex [celecoxib]    Sulfa Antibiotics    Had sores in my mouth, bottom of my feet "the skin just came right off"      Medication List        Accurate as of 08/06/17  9:18 AM. Always use your most recent med list.          albuterol 108 (90 Base) MCG/ACT inhaler Commonly known as:  PROVENTIL HFA;VENTOLIN HFA Inhale 2 puffs every 6 (six) hours as needed into the lungs for wheezing or shortness of breath.   ALPRAZolam 0.25 MG tablet Commonly known as:  XANAX TAKE ONE TABLET BY MOUTH TWICE DAILY   amoxicillin 500  MG capsule Commonly known as:  AMOXIL Take 2 capsules (1,000 mg total) by mouth 2 (two) times daily.   budesonide-formoterol 80-4.5 MCG/ACT inhaler Commonly known as:  SYMBICORT Inhale 2 puffs into the lungs 2 (two) times daily.   cetirizine 10 MG tablet Commonly known as:  ZYRTEC Take 1 tablet (10 mg total) by mouth daily as needed for allergies or rhinitis.   colchicine 0.6 MG tablet TAKE 1 TABLET NOW. MAY REPEAT IN 2 HOURSIF STILL HURTING (NO MORE THAN 2 TABLETS DAILY)   fluticasone 50 MCG/ACT nasal spray Commonly known as:  FLONASE Place 1 spray into both nostrils 2 (two) times daily.   gabapentin 300 MG capsule Commonly known as:  NEURONTIN TAKE ONE (1) CAPSULE THREE (3)  TIMES EACH DAY   hydrochlorothiazide 25 MG tablet Commonly known as:  HYDRODIURIL Take 1 tablet (25 mg total) by mouth daily.   ipratropium-albuterol 0.5-2.5 (3) MG/3ML Soln Commonly known as:  DUONEB Take 3 mLs by nebulization every 6 (six) hours as needed.   lisinopril 10 MG tablet Commonly known as:  PRINIVIL,ZESTRIL Take 1 tablet (10 mg total) by mouth daily.   loratadine 10 MG tablet Commonly known as:  CLARITIN Take 1 tablet (10 mg total) by mouth daily.   omeprazole 40 MG capsule Commonly known as:  PRILOSEC Take 1 capsule (40 mg total) by mouth daily.   Spacer/Aero Chamber Mouthpiece Misc 1 Units 4 (four) times daily by Does not apply route.   TYLENOL ARTHRITIS PAIN PO Take by mouth daily.            Durable Medical Equipment  (From admission, onward)        Start     Ordered   08/06/17 0000  DME Nebulizer machine    Question:  Patient needs a nebulizer to treat with the following condition  Answer:  Asthma   08/06/17 0854         Objective:    BP 124/71   Pulse 89   Temp (!) 97.3 F (36.3 C) (Oral)   Ht 5\' 2"  (1.575 m)   Wt 162 lb 9.6 oz (73.8 kg)   SpO2 97%   BMI 29.74 kg/m   Allergies  Allergen Reactions  . Celebrex [Celecoxib]   . Sulfa Antibiotics     Had sores in my mouth, bottom of my feet "the skin just came right off"    Physical Exam  Constitutional: She is oriented to person, place, and time. She appears well-developed and well-nourished.  HENT:  Head: Normocephalic and atraumatic.  Right Ear: There is drainage and tenderness.  Left Ear: There is drainage and tenderness.  Nose: Mucosal edema and rhinorrhea present. Right sinus exhibits no maxillary sinus tenderness and no frontal sinus tenderness. Left sinus exhibits no maxillary sinus tenderness and no frontal sinus tenderness.  Mouth/Throat: Oropharyngeal exudate and posterior oropharyngeal erythema present.  Eyes: Conjunctivae and EOM are normal. Pupils are equal, round,  and reactive to light.  Neck: Normal range of motion. Neck supple.  Cardiovascular: Normal rate, regular rhythm, normal heart sounds and intact distal pulses.  Pulmonary/Chest: Effort normal. She has wheezes in the right upper field and the left upper field.  Abdominal: Soft. Bowel sounds are normal.  Neurological: She is alert and oriented to person, place, and time. She has normal reflexes.  Skin: Skin is warm and dry. No rash noted.  Psychiatric: She has a normal mood and affect. Her behavior is normal. Judgment and thought content normal.  Assessment & Plan:   1. Bronchitis - methylPREDNISolone acetate (DEPO-MEDROL) injection 80 mg - amoxicillin (AMOXIL) 500 MG capsule; Take 2 capsules (1,000 mg total) by mouth 2 (two) times daily.  Dispense: 40 capsule; Refill: 0  2. Intrinsic asthma - DME Nebulizer machine - ipratropium-albuterol (DUONEB) 0.5-2.5 (3) MG/3ML SOLN; Take 3 mLs by nebulization every 6 (six) hours as needed.  Dispense: 360 mL; Refill: 6  3. Allergic rhinitis, unspecified seasonality, unspecified trigger - loratadine (CLARITIN) 10 MG tablet; Take 1 tablet (10 mg total) by mouth daily.  Dispense: 30 tablet; Refill: 11 - fluticasone (FLONASE) 50 MCG/ACT nasal spray; Place 1 spray into both nostrils 2 (two) times daily.  Dispense: 16 g; Refill: 11     Current Outpatient Medications:  .  Acetaminophen (TYLENOL ARTHRITIS PAIN PO), Take by mouth daily., Disp: , Rfl:  .  albuterol (PROVENTIL HFA;VENTOLIN HFA) 108 (90 Base) MCG/ACT inhaler, Inhale 2 puffs every 6 (six) hours as needed into the lungs for wheezing or shortness of breath., Disp: 1 Inhaler, Rfl: 0 .  ALPRAZolam (XANAX) 0.25 MG tablet, TAKE ONE TABLET BY MOUTH TWICE DAILY, Disp: 60 tablet, Rfl: 1 .  budesonide-formoterol (SYMBICORT) 80-4.5 MCG/ACT inhaler, Inhale 2 puffs into the lungs 2 (two) times daily. (Patient taking differently: Inhale 2 puffs into the lungs daily as needed (wheezing/shortness of  breath). ), Disp: 2 Inhaler, Rfl: 0 .  cetirizine (ZYRTEC) 10 MG tablet, Take 1 tablet (10 mg total) by mouth daily as needed for allergies or rhinitis., Disp: 30 tablet, Rfl: 11 .  colchicine 0.6 MG tablet, TAKE 1 TABLET NOW. MAY REPEAT IN 2 HOURSIF STILL HURTING (NO MORE THAN 2 TABLETS DAILY), Disp: 20 tablet, Rfl: 0 .  gabapentin (NEURONTIN) 300 MG capsule, TAKE ONE (1) CAPSULE THREE (3) TIMES EACH DAY, Disp: 270 capsule, Rfl: 0 .  hydrochlorothiazide (HYDRODIURIL) 25 MG tablet, Take 1 tablet (25 mg total) by mouth daily., Disp: 90 tablet, Rfl: 3 .  lisinopril (PRINIVIL,ZESTRIL) 10 MG tablet, Take 1 tablet (10 mg total) by mouth daily., Disp: 90 tablet, Rfl: 3 .  omeprazole (PRILOSEC) 40 MG capsule, Take 1 capsule (40 mg total) by mouth daily., Disp: 90 capsule, Rfl: 3 .  Spacer/Aero Chamber Mouthpiece MISC, 1 Units 4 (four) times daily by Does not apply route., Disp: 1 each, Rfl: 0 .  amoxicillin (AMOXIL) 500 MG capsule, Take 2 capsules (1,000 mg total) by mouth 2 (two) times daily., Disp: 40 capsule, Rfl: 0 .  fluticasone (FLONASE) 50 MCG/ACT nasal spray, Place 1 spray into both nostrils 2 (two) times daily., Disp: 16 g, Rfl: 11 .  ipratropium-albuterol (DUONEB) 0.5-2.5 (3) MG/3ML SOLN, Take 3 mLs by nebulization every 6 (six) hours as needed., Disp: 360 mL, Rfl: 6 .  loratadine (CLARITIN) 10 MG tablet, Take 1 tablet (10 mg total) by mouth daily., Disp: 30 tablet, Rfl: 11 Continue all other maintenance medications as listed above.  Follow up plan: Follow-up as needed or worsening of symptoms. Call office for any issues.   Educational handout given for [place] PA-C [place] [address]  [place], [place] [address] [phone]   08/06/2017, 9:18 AM

## 2017-08-06 NOTE — Patient Instructions (Signed)
In a few days you may receive a survey in the mail or online from [employer] regarding your visit with us today. Please take a moment to fill this out. Your feedback is very important to our whole office. It can help us better understand your needs as well as improve your experience and satisfaction. Thank you for taking your time to complete it. We care about you.  Randolf Sansoucie, PA-C  

## 2017-08-16 DIAGNOSIS — Z961 Presence of intraocular lens: Secondary | ICD-10-CM | POA: Diagnosis not present

## 2017-08-16 DIAGNOSIS — H1045 Other chronic allergic conjunctivitis: Secondary | ICD-10-CM | POA: Diagnosis not present

## 2017-08-16 DIAGNOSIS — H524 Presbyopia: Secondary | ICD-10-CM | POA: Diagnosis not present

## 2017-09-04 ENCOUNTER — Other Ambulatory Visit: Payer: Self-pay | Admitting: Physician Assistant

## 2017-09-04 DIAGNOSIS — M79672 Pain in left foot: Principal | ICD-10-CM

## 2017-09-04 DIAGNOSIS — M79671 Pain in right foot: Secondary | ICD-10-CM

## 2017-09-06 DIAGNOSIS — J4520 Mild intermittent asthma, uncomplicated: Secondary | ICD-10-CM | POA: Diagnosis not present

## 2017-09-11 ENCOUNTER — Other Ambulatory Visit: Payer: Self-pay | Admitting: Physician Assistant

## 2017-09-11 DIAGNOSIS — M7989 Other specified soft tissue disorders: Secondary | ICD-10-CM

## 2017-09-27 ENCOUNTER — Other Ambulatory Visit: Payer: Self-pay | Admitting: Physician Assistant

## 2017-09-27 DIAGNOSIS — M7989 Other specified soft tissue disorders: Secondary | ICD-10-CM

## 2017-10-04 DIAGNOSIS — J4520 Mild intermittent asthma, uncomplicated: Secondary | ICD-10-CM | POA: Diagnosis not present

## 2017-10-07 ENCOUNTER — Other Ambulatory Visit: Payer: Self-pay | Admitting: Physician Assistant

## 2017-10-07 DIAGNOSIS — M7989 Other specified soft tissue disorders: Secondary | ICD-10-CM

## 2017-10-17 DIAGNOSIS — Z961 Presence of intraocular lens: Secondary | ICD-10-CM | POA: Diagnosis not present

## 2017-10-17 DIAGNOSIS — H26493 Other secondary cataract, bilateral: Secondary | ICD-10-CM | POA: Diagnosis not present

## 2017-10-17 DIAGNOSIS — H40053 Ocular hypertension, bilateral: Secondary | ICD-10-CM | POA: Diagnosis not present

## 2017-10-17 DIAGNOSIS — H353131 Nonexudative age-related macular degeneration, bilateral, early dry stage: Secondary | ICD-10-CM | POA: Diagnosis not present

## 2017-10-28 ENCOUNTER — Other Ambulatory Visit: Payer: Self-pay | Admitting: Physician Assistant

## 2017-10-28 DIAGNOSIS — F419 Anxiety disorder, unspecified: Secondary | ICD-10-CM

## 2017-11-04 DIAGNOSIS — J4520 Mild intermittent asthma, uncomplicated: Secondary | ICD-10-CM | POA: Diagnosis not present

## 2017-11-06 ENCOUNTER — Encounter: Payer: Self-pay | Admitting: Physician Assistant

## 2017-11-06 ENCOUNTER — Ambulatory Visit: Payer: Medicare Other | Admitting: Physician Assistant

## 2017-11-06 VITALS — BP 138/73 | HR 88 | Temp 97.4°F | Ht 62.0 in | Wt 163.0 lb

## 2017-11-06 DIAGNOSIS — Z0000 Encounter for general adult medical examination without abnormal findings: Secondary | ICD-10-CM

## 2017-11-06 DIAGNOSIS — M1A9XX0 Chronic gout, unspecified, without tophus (tophi): Secondary | ICD-10-CM | POA: Diagnosis not present

## 2017-11-06 DIAGNOSIS — S8001XA Contusion of right knee, initial encounter: Secondary | ICD-10-CM | POA: Diagnosis not present

## 2017-11-06 DIAGNOSIS — M79672 Pain in left foot: Secondary | ICD-10-CM

## 2017-11-06 DIAGNOSIS — K219 Gastro-esophageal reflux disease without esophagitis: Secondary | ICD-10-CM | POA: Diagnosis not present

## 2017-11-06 DIAGNOSIS — M7989 Other specified soft tissue disorders: Secondary | ICD-10-CM | POA: Diagnosis not present

## 2017-11-06 DIAGNOSIS — M79671 Pain in right foot: Secondary | ICD-10-CM

## 2017-11-06 DIAGNOSIS — F419 Anxiety disorder, unspecified: Secondary | ICD-10-CM

## 2017-11-06 MED ORDER — ALPRAZOLAM 0.25 MG PO TABS: 0.2500 mg | ORAL_TABLET | Freq: Two times a day (BID) | ORAL | 5 refills | Status: DC

## 2017-11-06 MED ORDER — LISINOPRIL 10 MG PO TABS: 10.0000 mg | ORAL_TABLET | Freq: Every day | ORAL | 3 refills | Status: DC

## 2017-11-06 MED ORDER — OMEPRAZOLE 40 MG PO CPDR: 40.0000 mg | DELAYED_RELEASE_CAPSULE | Freq: Every day | ORAL | 3 refills | Status: DC

## 2017-11-06 MED ORDER — GABAPENTIN 300 MG PO CAPS: ORAL_CAPSULE | ORAL | 5 refills | Status: DC

## 2017-11-06 MED ORDER — COLCHICINE 0.6 MG PO TABS: ORAL_TABLET | ORAL | 5 refills | Status: DC

## 2017-11-07 ENCOUNTER — Encounter: Payer: Self-pay | Admitting: Obstetrics & Gynecology

## 2017-11-07 ENCOUNTER — Other Ambulatory Visit: Payer: Self-pay

## 2017-11-07 ENCOUNTER — Ambulatory Visit: Payer: Medicare Other | Admitting: Obstetrics & Gynecology

## 2017-11-07 VITALS — BP 140/78 | HR 68 | Ht 62.0 in | Wt 161.0 lb

## 2017-11-07 DIAGNOSIS — Z4689 Encounter for fitting and adjustment of other specified devices: Secondary | ICD-10-CM | POA: Diagnosis not present

## 2017-11-07 DIAGNOSIS — M1A9XX0 Chronic gout, unspecified, without tophus (tophi): Secondary | ICD-10-CM | POA: Insufficient documentation

## 2017-11-07 DIAGNOSIS — N813 Complete uterovaginal prolapse: Secondary | ICD-10-CM | POA: Diagnosis not present

## 2017-11-07 NOTE — Progress Notes (Signed)
BP 138/73   Pulse 88   Temp (!) 97.4 F (36.3 C) (Oral)   Ht 5' 2"  (1.575 m)   Wt 163 lb (73.9 kg)   BMI 29.81 kg/m    Subjective:    Patient ID: Christie Bruce, female    DOB: [birth year], 77 y.o.   MRN: [id number]  HPI: Christie Bruce is a 77 y.o. female presenting on 11/06/2017 for Follow-up (and labs )  This patient comes in for periodic recheck on medications and conditions including chronic arthritis, bilateral foot pain, GERD.  She recently had a fall where her right knee was very contused and a couple of her fingers.  She is feeling much better from that it has been about a month since that happened.  She has been able to weight-bear.  She has no other complaints at this time.   All medications are reviewed today. There are no reports of any problems with the medications. All of the medical conditions are reviewed and updated.  Lab work is reviewed and will be ordered as medically necessary. There are no new problems reported with today's visit.   Past Medical History:  Diagnosis Date  . Asthma    small issue and uses inhaler if needed  . Breast cancer ([place])   . Chronic kidney disease   . 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 GERD (gastroesophageal reflux disease)   . GERD (gastroesophageal reflux disease) 09/12/2011  . Gout   . Heart murmur   . Hypertension   . PONV (postoperative nausea and vomiting)   . Uterine prolapse    Relevant past medical, surgical, family and social history reviewed and updated as indicated. Interim medical history since our last visit reviewed. Allergies and medications reviewed and updated. DATA REVIEWED: CHART IN EPIC  Family History reviewed for pertinent findings.  Review of Systems  Constitutional: Negative.   HENT: Negative.   Eyes: Negative.   Respiratory: Negative.   Gastrointestinal: Negative.   Genitourinary: Negative.   Musculoskeletal: Positive for arthralgias, joint swelling and myalgias.    Psychiatric/Behavioral: The patient is nervous/anxious.     Allergies as of 11/06/2017      Reactions   Celebrex [celecoxib]    Sulfa Antibiotics    Had sores in my mouth, bottom of my feet "the skin just came right off"      Medication List        Accurate as of 11/06/17 11:59 PM. Always use your most recent med list.          albuterol 108 (90 Base) MCG/ACT inhaler Commonly known as:  PROVENTIL HFA;VENTOLIN HFA Inhale 2 puffs every 6 (six) hours as needed into the lungs for wheezing or shortness of breath.   ALPRAZolam 0.25 MG tablet Commonly known as:  XANAX Take 1 tablet (0.25 mg total) by mouth 2 (two) times daily.   cetirizine 10 MG tablet Commonly known as:  ZYRTEC Take 1 tablet (10 mg total) by mouth daily as needed for allergies or rhinitis.   colchicine 0.6 MG tablet TAKE 1 TABLET NOW THEN REPEAT IN 2 HOURS IF NEEDED (MAX OF 2 TABS DAILY)   gabapentin 300 MG capsule Commonly known as:  NEURONTIN TAKE ONE (1) CAPSULE THREE (3) TIMES EACH DAY   hydrochlorothiazide 25 MG tablet Commonly known as:  HYDRODIURIL Take 1 tablet (25 mg total) by mouth daily.   ipratropium-albuterol 0.5-2.5 (3) MG/3ML Soln Commonly known as:  DUONEB Take 3 mLs by nebulization  every 6 (six) hours as needed.   lisinopril 10 MG tablet Commonly known as:  PRINIVIL,ZESTRIL Take 1 tablet (10 mg total) by mouth daily.   loratadine 10 MG tablet Commonly known as:  CLARITIN Take 1 tablet (10 mg total) by mouth daily.   omeprazole 40 MG capsule Commonly known as:  PRILOSEC Take 1 capsule (40 mg total) by mouth daily.   Spacer/Aero Chamber Mouthpiece Misc 1 Units 4 (four) times daily by Does not apply route.   TYLENOL ARTHRITIS PAIN PO Take by mouth daily.          Objective:    BP 138/73   Pulse 88   Temp (!) 97.4 F (36.3 C) (Oral)   Ht 5' 2"  (1.575 m)   Wt 163 lb (73.9 kg)   BMI 29.81 kg/m   Allergies  Allergen Reactions  . Celebrex [Celecoxib]   . Sulfa  Antibiotics     Had sores in my mouth, bottom of my feet "the skin just came right off"    Wt Readings from Last 3 Encounters:  11/06/17 163 lb (73.9 kg)  08/06/17 162 lb 9.6 oz (73.8 kg)  07/11/17 162 lb (73.5 kg)    Physical Exam  Constitutional: She is oriented to person, place, and time. She appears well-developed and well-nourished.  HENT:  Head: Normocephalic and atraumatic.  Eyes: Pupils are equal, round, and reactive to light. Conjunctivae and EOM are normal.  Cardiovascular: Normal rate, regular rhythm, normal heart sounds and intact distal pulses.  Pulmonary/Chest: Effort normal and breath sounds normal.  Abdominal: Soft. Bowel sounds are normal.  Musculoskeletal:       Right knee: She exhibits swelling. She exhibits no deformity. Tenderness found. Lateral joint line tenderness noted.       Legs: Neurological: She is alert and oriented to person, place, and time. She has normal reflexes.  Skin: Skin is warm and dry. No rash noted.  Psychiatric: She has a normal mood and affect. Her behavior is normal. Judgment and thought content normal.    Results for orders placed or performed in visit on 04/12/17  Arthritis Panel  Result Value Ref Range   Uric Acid 10.3 (H) 2.5 - 7.1 mg/dL   Rhuematoid fact SerPl-aCnc <10.0 0.0 - 13.9 IU/mL   WBC 6.5 3.4 - 10.8 x10E3/uL   RBC 3.30 (L) 3.77 - 5.28 x10E6/uL   Hemoglobin 9.7 (L) 11.1 - 15.9 g/dL   Hematocrit 30.7 (L) 34.0 - 46.6 %   MCV 93 79 - 97 fL   MCH 29.4 26.6 - 33.0 pg   MCHC 31.6 31.5 - 35.7 g/dL   RDW 14.1 12.3 - 15.4 %   Platelets 260 150 - 379 x10E3/uL   Neutrophils 51 Not Estab. %   Lymphs 40 Not Estab. %   Monocytes 6 Not Estab. %   Eos 3 Not Estab. %   Basos 0 Not Estab. %   Neutrophils Absolute 3.2 1.4 - 7.0 x10E3/uL   Lymphocytes Absolute 2.6 0.7 - 3.1 x10E3/uL   Monocytes Absolute 0.4 0.1 - 0.9 x10E3/uL   EOS (ABSOLUTE) 0.2 0.0 - 0.4 x10E3/uL   Basophils Absolute 0.0 0.0 - 0.2 x10E3/uL   Immature  Granulocytes 0 Not Estab. %   Immature Grans (Abs) 0.0 0.0 - 0.1 x10E3/uL   Sed Rate 18 0 - 40 mm/hr      Assessment & Plan:   1. Contusion of right knee, initial encounter  2. Swelling of right index finger - colchicine 0.6 MG  tablet; TAKE 1 TABLET NOW THEN REPEAT IN 2 HOURS IF NEEDED (MAX OF 2 TABS DAILY)  Dispense: 30 tablet; Refill: 5  3. Swelling of right middle finger - colchicine 0.6 MG tablet; TAKE 1 TABLET NOW THEN REPEAT IN 2 HOURS IF NEEDED (MAX OF 2 TABS DAILY)  Dispense: 30 tablet; Refill: 5  4. Foot pain, bilateral - lisinopril (PRINIVIL,ZESTRIL) 10 MG tablet; Take 1 tablet (10 mg total) by mouth daily.  Dispense: 90 tablet; Refill: 3 - gabapentin (NEURONTIN) 300 MG capsule; TAKE ONE (1) CAPSULE THREE (3) TIMES EACH DAY  Dispense: 270 capsule; Refill: 5  5. Gastroesophageal reflux disease without esophagitis - omeprazole (PRILOSEC) 40 MG capsule; Take 1 capsule (40 mg total) by mouth daily.  Dispense: 90 capsule; Refill: 3  6. Anxiety - ALPRAZolam (XANAX) 0.25 MG tablet; Take 1 tablet (0.25 mg total) by mouth 2 (two) times daily.  Dispense: 60 tablet; Refill: 5  7. Well adult exam - CBC with Differential/Platelet; Future - CMP14+EGFR; Future - Lipid panel; Future - TSH; Future   Continue all other maintenance medications as listed above.  Follow up plan: Return in about 6 months (around 05/08/2018) for recheck.  Educational handout given for [place] PA-C [place] [address]  [place], [place] [address] [phone]   11/07/2017, 8:06 AM

## 2017-11-08 ENCOUNTER — Ambulatory Visit: Payer: Medicare Other | Admitting: Obstetrics & Gynecology

## 2017-12-04 DIAGNOSIS — J4520 Mild intermittent asthma, uncomplicated: Secondary | ICD-10-CM | POA: Diagnosis not present

## 2017-12-06 ENCOUNTER — Encounter: Payer: Self-pay | Admitting: Obstetrics & Gynecology

## 2017-12-06 NOTE — Progress Notes (Signed)
Chief Complaint  Patient presents with  . Follow-up    pessary    Blood pressure (!) 148/80, pulse 88, height 5\' 2"  (1.575 m), weight 162 lb (73.5 kg).  Christie Bruce presents today for routine follow up related to her pessary.   She uses a milex ring with support #2 She reports no vaginal discharge or vaginal bleeding.  Exam reveals no undue vaginal mucosal pressure of breakdown, no discharge and no vaginal bleeding.  The pessary is removed, cleaned and replaced without difficulty.    Christie Bruce will be sen back in 4 months for continued follow up.  Florian Buff, MD

## 2018-01-01 NOTE — Progress Notes (Signed)
Chief Complaint  Patient presents with  . pessary maintanence    Blood pressure 140/78, pulse 68, height 5\' 2"  (1.575 m), weight 161 lb (73 kg).  Christie Bruce presents today for routine follow up related to her pessary.   She uses a Milex ring with support #2 She reports no vaginal discharge or vaginal bleeding.  Exam reveals no undue vaginal mucosal pressure of breakdown, no discharge and no vaginal bleeding.  The pessary is removed, cleaned and replaced without difficulty.    Christie Bruce will be sen back in 4 months for continued follow up.

## 2018-01-04 DIAGNOSIS — J4520 Mild intermittent asthma, uncomplicated: Secondary | ICD-10-CM | POA: Diagnosis not present

## 2018-01-10 ENCOUNTER — Other Ambulatory Visit: Payer: Medicare Other

## 2018-01-10 DIAGNOSIS — Z0000 Encounter for general adult medical examination without abnormal findings: Secondary | ICD-10-CM | POA: Diagnosis not present

## 2018-01-10 DIAGNOSIS — I10 Essential (primary) hypertension: Secondary | ICD-10-CM | POA: Diagnosis not present

## 2018-01-11 LAB — CMP14+EGFR
A/G RATIO: 1.9 (ref 1.2–2.2)
ALK PHOS: 131 IU/L — AB (ref 39–117)
ALT: 13 IU/L (ref 0–32)
AST: 29 IU/L (ref 0–40)
Albumin: 4.4 g/dL (ref 3.5–4.8)
BILIRUBIN TOTAL: 0.4 mg/dL (ref 0.0–1.2)
BUN/Creatinine Ratio: 13 (ref 12–28)
BUN: 20 mg/dL (ref 8–27)
CHLORIDE: 106 mmol/L (ref 96–106)
CO2: 22 mmol/L (ref 20–29)
Calcium: 7.8 mg/dL — ABNORMAL LOW (ref 8.7–10.3)
Creatinine, Ser: 1.57 mg/dL — ABNORMAL HIGH (ref 0.57–1.00)
GFR calc Af Amer: 36 mL/min/{1.73_m2} — ABNORMAL LOW (ref >59)
GFR calc non Af Amer: 32 mL/min/{1.73_m2} — ABNORMAL LOW (ref >59)
Globulin, Total: 2.3 g/dL (ref 1.5–4.5)
Glucose: 80 mg/dL (ref 65–99)
POTASSIUM: 4.9 mmol/L (ref 3.5–5.2)
SODIUM: 144 mmol/L (ref 134–144)
Total Protein: 6.7 g/dL (ref 6.0–8.5)

## 2018-01-11 LAB — TSH: TSH: 26.51 u[IU]/mL — ABNORMAL HIGH (ref 0.450–4.500)

## 2018-01-11 LAB — LIPID PANEL
CHOL/HDL RATIO: 3.3 ratio (ref 0.0–4.4)
Cholesterol, Total: 168 mg/dL (ref 100–199)
HDL: 51 mg/dL (ref >39)
LDL CALC: 94 mg/dL (ref 0–99)
TRIGLYCERIDES: 117 mg/dL (ref 0–149)
VLDL Cholesterol Cal: 23 mg/dL (ref 5–40)

## 2018-01-11 LAB — CBC WITH DIFFERENTIAL/PLATELET
BASOS ABS: 0 10*3/uL (ref 0.0–0.2)
Basos: 1 %
EOS (ABSOLUTE): 0.3 10*3/uL (ref 0.0–0.4)
Eos: 6 %
Hematocrit: 30.1 % — ABNORMAL LOW (ref 34.0–46.6)
Hemoglobin: 9.6 g/dL — ABNORMAL LOW (ref 11.1–15.9)
IMMATURE GRANS (ABS): 0 10*3/uL (ref 0.0–0.1)
IMMATURE GRANULOCYTES: 0 %
LYMPHS: 38 %
Lymphocytes Absolute: 1.7 10*3/uL (ref 0.7–3.1)
MCH: 30.6 pg (ref 26.6–33.0)
MCHC: 31.9 g/dL (ref 31.5–35.7)
MCV: 96 fL (ref 79–97)
MONOS ABS: 0.5 10*3/uL (ref 0.1–0.9)
Monocytes: 11 %
NEUTROS PCT: 44 %
Neutrophils Absolute: 2 10*3/uL (ref 1.4–7.0)
Platelets: 276 10*3/uL (ref 150–450)
RBC: 3.14 x10E6/uL — AB (ref 3.77–5.28)
RDW: 14.2 % (ref 12.3–15.4)
WBC: 4.4 10*3/uL (ref 3.4–10.8)

## 2018-01-13 ENCOUNTER — Other Ambulatory Visit: Payer: Self-pay | Admitting: *Deleted

## 2018-01-13 ENCOUNTER — Other Ambulatory Visit: Payer: Self-pay | Admitting: Physician Assistant

## 2018-01-13 DIAGNOSIS — R748 Abnormal levels of other serum enzymes: Secondary | ICD-10-CM

## 2018-01-13 MED ORDER — LEVOTHYROXINE SODIUM 75 MCG PO TABS: 75.0000 ug | ORAL_TABLET | Freq: Every day | ORAL | 3 refills | Status: DC

## 2018-01-24 ENCOUNTER — Other Ambulatory Visit: Payer: Self-pay | Admitting: Physician Assistant

## 2018-01-24 DIAGNOSIS — I10 Essential (primary) hypertension: Secondary | ICD-10-CM

## 2018-02-03 DIAGNOSIS — J4520 Mild intermittent asthma, uncomplicated: Secondary | ICD-10-CM | POA: Diagnosis not present

## 2018-02-17 DIAGNOSIS — H5310 Unspecified subjective visual disturbances: Secondary | ICD-10-CM | POA: Diagnosis not present

## 2018-02-17 DIAGNOSIS — H353131 Nonexudative age-related macular degeneration, bilateral, early dry stage: Secondary | ICD-10-CM | POA: Diagnosis not present

## 2018-02-17 DIAGNOSIS — H1851 Endothelial corneal dystrophy: Secondary | ICD-10-CM | POA: Diagnosis not present

## 2018-03-04 DIAGNOSIS — R809 Proteinuria, unspecified: Secondary | ICD-10-CM | POA: Diagnosis not present

## 2018-03-04 DIAGNOSIS — N183 Chronic kidney disease, stage 3 (moderate): Secondary | ICD-10-CM | POA: Diagnosis not present

## 2018-03-04 DIAGNOSIS — I10 Essential (primary) hypertension: Secondary | ICD-10-CM | POA: Diagnosis not present

## 2018-03-06 DIAGNOSIS — J4520 Mild intermittent asthma, uncomplicated: Secondary | ICD-10-CM | POA: Diagnosis not present

## 2018-03-10 ENCOUNTER — Ambulatory Visit: Payer: Medicare Other | Admitting: Obstetrics & Gynecology

## 2018-03-10 ENCOUNTER — Encounter: Payer: Self-pay | Admitting: Obstetrics & Gynecology

## 2018-03-10 ENCOUNTER — Other Ambulatory Visit: Payer: Self-pay

## 2018-03-10 ENCOUNTER — Encounter (INDEPENDENT_AMBULATORY_CARE_PROVIDER_SITE_OTHER): Payer: Self-pay

## 2018-03-10 VITALS — BP 140/76 | HR 84 | Ht 62.0 in | Wt 155.0 lb

## 2018-03-10 DIAGNOSIS — Z4689 Encounter for fitting and adjustment of other specified devices: Secondary | ICD-10-CM

## 2018-03-10 DIAGNOSIS — N813 Complete uterovaginal prolapse: Secondary | ICD-10-CM | POA: Diagnosis not present

## 2018-03-10 NOTE — Progress Notes (Signed)
Chief Complaint  Patient presents with  . pessary maintenance    Blood pressure 140/76, pulse 84, height 5\' 2"  (1.575 m), weight 155 lb (70.3 kg).  Christie Bruce presents today for routine follow up related to her pessary.   She uses a milex ring with support #2 She reports no vaginal discharge or vaginal bleeding.  Exam reveals no undue vaginal mucosal pressure of breakdown, no discharge and no vaginal bleeding.  The pessary is removed, cleaned and replaced without difficulty.    Christie Bruce will be sen back in 4 months for continued follow up.  Florian Buff, MD  03/10/2018 9:24 AM

## 2018-03-20 ENCOUNTER — Other Ambulatory Visit (HOSPITAL_COMMUNITY): Payer: Self-pay | Admitting: Medical

## 2018-03-20 DIAGNOSIS — N183 Chronic kidney disease, stage 3 unspecified: Secondary | ICD-10-CM

## 2018-03-28 ENCOUNTER — Encounter: Payer: Self-pay | Admitting: Physician Assistant

## 2018-03-28 DIAGNOSIS — E559 Vitamin D deficiency, unspecified: Secondary | ICD-10-CM | POA: Diagnosis not present

## 2018-03-28 DIAGNOSIS — I10 Essential (primary) hypertension: Secondary | ICD-10-CM | POA: Diagnosis not present

## 2018-03-28 DIAGNOSIS — Z1159 Encounter for screening for other viral diseases: Secondary | ICD-10-CM | POA: Diagnosis not present

## 2018-03-28 DIAGNOSIS — D509 Iron deficiency anemia, unspecified: Secondary | ICD-10-CM | POA: Diagnosis not present

## 2018-03-28 DIAGNOSIS — R809 Proteinuria, unspecified: Secondary | ICD-10-CM | POA: Diagnosis not present

## 2018-03-28 DIAGNOSIS — N183 Chronic kidney disease, stage 3 (moderate): Secondary | ICD-10-CM | POA: Diagnosis not present

## 2018-03-28 DIAGNOSIS — Z79899 Other long term (current) drug therapy: Secondary | ICD-10-CM | POA: Diagnosis not present

## 2018-04-01 ENCOUNTER — Ambulatory Visit (HOSPITAL_COMMUNITY)
Admission: RE | Admit: 2018-04-01 | Discharge: 2018-04-01 | Disposition: A | Discharge status: Home / Self Care | Payer: Medicare Other | Source: Ambulatory Visit | Attending: Medical | Admitting: Medical

## 2018-04-01 DIAGNOSIS — N271 Small kidney, bilateral: Secondary | ICD-10-CM | POA: Insufficient documentation

## 2018-04-01 DIAGNOSIS — N183 Chronic kidney disease, stage 3 unspecified: Secondary | ICD-10-CM

## 2018-04-02 ENCOUNTER — Encounter: Payer: Self-pay | Admitting: Physician Assistant

## 2018-04-06 DIAGNOSIS — J4520 Mild intermittent asthma, uncomplicated: Secondary | ICD-10-CM | POA: Diagnosis not present

## 2018-04-07 ENCOUNTER — Other Ambulatory Visit: Payer: Self-pay | Admitting: *Deleted

## 2018-04-07 ENCOUNTER — Telehealth: Payer: Self-pay | Admitting: Physician Assistant

## 2018-04-07 ENCOUNTER — Other Ambulatory Visit: Payer: Medicare Other

## 2018-04-07 DIAGNOSIS — I10 Essential (primary) hypertension: Secondary | ICD-10-CM | POA: Diagnosis not present

## 2018-04-07 DIAGNOSIS — R7989 Other specified abnormal findings of blood chemistry: Secondary | ICD-10-CM

## 2018-04-07 NOTE — Telephone Encounter (Signed)
Patient aware she needs to have blood drawn

## 2018-04-08 LAB — TSH: TSH: 1.74 u[IU]/mL (ref 0.450–4.500)

## 2018-04-15 DIAGNOSIS — M1000 Idiopathic gout, unspecified site: Secondary | ICD-10-CM | POA: Diagnosis not present

## 2018-04-15 DIAGNOSIS — N183 Chronic kidney disease, stage 3 (moderate): Secondary | ICD-10-CM | POA: Diagnosis not present

## 2018-04-15 DIAGNOSIS — I10 Essential (primary) hypertension: Secondary | ICD-10-CM | POA: Diagnosis not present

## 2018-04-15 DIAGNOSIS — D649 Anemia, unspecified: Secondary | ICD-10-CM | POA: Diagnosis not present

## 2018-05-05 ENCOUNTER — Other Ambulatory Visit: Payer: Self-pay | Admitting: Physician Assistant

## 2018-05-05 DIAGNOSIS — I10 Essential (primary) hypertension: Secondary | ICD-10-CM

## 2018-05-06 DIAGNOSIS — J4520 Mild intermittent asthma, uncomplicated: Secondary | ICD-10-CM | POA: Diagnosis not present

## 2018-05-12 ENCOUNTER — Other Ambulatory Visit: Payer: Self-pay | Admitting: Physician Assistant

## 2018-05-12 DIAGNOSIS — F419 Anxiety disorder, unspecified: Secondary | ICD-10-CM

## 2018-05-13 NOTE — Telephone Encounter (Signed)
Left message, script needs a refill and please call to schedule an appointment.

## 2018-05-13 NOTE — Telephone Encounter (Signed)
Can approve 2 weeks worth, but needs follow up appointment

## 2018-05-13 NOTE — Telephone Encounter (Signed)
Last seen 11/06/17 

## 2018-05-15 DIAGNOSIS — T783XXA Angioneurotic edema, initial encounter: Secondary | ICD-10-CM | POA: Diagnosis not present

## 2018-05-15 DIAGNOSIS — T464X5A Adverse effect of angiotensin-converting-enzyme inhibitors, initial encounter: Secondary | ICD-10-CM | POA: Diagnosis not present

## 2018-05-15 DIAGNOSIS — K219 Gastro-esophageal reflux disease without esophagitis: Secondary | ICD-10-CM | POA: Diagnosis not present

## 2018-05-15 DIAGNOSIS — M109 Gout, unspecified: Secondary | ICD-10-CM | POA: Diagnosis not present

## 2018-05-15 DIAGNOSIS — N189 Chronic kidney disease, unspecified: Secondary | ICD-10-CM | POA: Diagnosis not present

## 2018-05-15 DIAGNOSIS — Z87891 Personal history of nicotine dependence: Secondary | ICD-10-CM | POA: Diagnosis not present

## 2018-05-15 DIAGNOSIS — I129 Hypertensive chronic kidney disease with stage 1 through stage 4 chronic kidney disease, or unspecified chronic kidney disease: Secondary | ICD-10-CM | POA: Diagnosis not present

## 2018-05-16 ENCOUNTER — Telehealth: Payer: Self-pay | Admitting: Physician Assistant

## 2018-05-16 NOTE — Telephone Encounter (Signed)
Returned patient's phone call.  Patient states that she had take Colchicine, hctz and lisinopril with allergic reaction of tongue swelling and throat closed up.  Seen at ED 05/15/2018 [facility].  Appt made for ED follow up

## 2018-05-18 NOTE — Telephone Encounter (Signed)
Has she taken them all at one time or was there a time sequence?

## 2018-05-19 ENCOUNTER — Ambulatory Visit: Payer: Medicare Other | Admitting: Physician Assistant

## 2018-05-19 ENCOUNTER — Other Ambulatory Visit: Payer: Self-pay | Admitting: Physician Assistant

## 2018-05-19 ENCOUNTER — Telehealth: Payer: Self-pay | Admitting: Physician Assistant

## 2018-05-19 ENCOUNTER — Encounter (INDEPENDENT_AMBULATORY_CARE_PROVIDER_SITE_OTHER): Payer: Self-pay

## 2018-05-19 ENCOUNTER — Encounter: Payer: Self-pay | Admitting: Physician Assistant

## 2018-05-19 VITALS — BP 144/66 | HR 69 | Temp 97.0°F | Ht 62.0 in | Wt 142.8 lb

## 2018-05-19 DIAGNOSIS — T783XXA Angioneurotic edema, initial encounter: Secondary | ICD-10-CM | POA: Diagnosis not present

## 2018-05-19 DIAGNOSIS — N183 Chronic kidney disease, stage 3 unspecified: Secondary | ICD-10-CM

## 2018-05-19 DIAGNOSIS — M1A9XX0 Chronic gout, unspecified, without tophus (tophi): Secondary | ICD-10-CM

## 2018-05-19 DIAGNOSIS — F419 Anxiety disorder, unspecified: Secondary | ICD-10-CM

## 2018-05-19 DIAGNOSIS — M7989 Other specified soft tissue disorders: Secondary | ICD-10-CM | POA: Diagnosis not present

## 2018-05-19 MED ORDER — ALPRAZOLAM 0.25 MG PO TABS: 0.2500 mg | ORAL_TABLET | Freq: Two times a day (BID) | ORAL | 5 refills | Status: DC

## 2018-05-19 NOTE — Telephone Encounter (Signed)
Needs 2 week rck with [facility]. Nothing front can make. Please call patient and schedule.

## 2018-05-19 NOTE — Telephone Encounter (Signed)
Pt was here today for appt

## 2018-05-19 NOTE — Telephone Encounter (Signed)
Appointment given.

## 2018-05-19 NOTE — Progress Notes (Signed)
BP (!) 144/66   Pulse 69   Temp (!) 97 F (36.1 C) (Oral)   Ht 5' 2"  (1.575 m)   Wt 142 lb 12.8 oz (64.8 kg)   BMI 26.12 kg/m    Subjective:    Patient ID: Christie Bruce, female    DOB: Jul 24, 1940, 77 y.o.   MRN: [id number]  HPI: Christie Bruce is a 77 y.o. female presenting on 05/19/2018 for ER follow up (allergic reaction to HCTZ and lisinopril ) Christie Bruce comes in today having several issues.  Over the past couple months she has been taking allopurinol from the nephrologist.  She has had a significant amount of gout flareups.  When she does she goes back and uses colchicine for a few days.  The most she is never had to take it was 5 days.  The gout episodes have been much more frequent.  Her son is with her today and they report that her last uric acid was 11.0.  She is also been on hydrochlorothiazide in the past on a as needed basis.  She has taken it a little more regularly.  We discussed that the possibility of diuretics increasing her gout episodes exists.  So we will hold this medicine unless she is having severe swelling to take 1 for 1 to 2 days.  Also she had swelling in her face and neck and throat last week and was taken to the emergency room by her family.  She has been on lisinopril for many years.  However it is very likely culprit on her list.  Stop that medicine today she has not taken it for the last 4 days.  Her blood pressure was 144/66.  She states that she is feeling well other than lots of joint pain.  We will see her back in a short interval and to perform labs at that time.   Past Medical History:  Diagnosis Date  . Asthma    small issue and uses inhaler if needed  . Breast cancer ([place])   . Chronic kidney disease   . 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 GERD (gastroesophageal reflux disease)   . GERD (gastroesophageal reflux disease) 09/12/2011  . Gout   . Heart murmur   . Hypertension   . PONV (postoperative nausea and vomiting)   .  Thyroid disease   . Uterine prolapse    Relevant past medical, surgical, family and social history reviewed and updated as indicated. Interim medical history since our last visit reviewed. Allergies and medications reviewed and updated. DATA REVIEWED: CHART IN EPIC  Family History reviewed for pertinent findings.  Review of Systems  Constitutional: Negative.   HENT: Negative.   Eyes: Negative.   Respiratory: Negative.  Negative for cough, shortness of breath and wheezing.   Cardiovascular: Negative.  Negative for chest pain.  Gastrointestinal: Negative.   Genitourinary: Negative.   Musculoskeletal: Positive for arthralgias, gait problem, joint swelling and myalgias.    Allergies as of 05/19/2018      Reactions   Celebrex [celecoxib]    Hydrochlorothiazide    Lisinopril Swelling   Sulfa Antibiotics    Had sores in my mouth, bottom of my feet "the skin just came right off"      Medication List        Accurate as of 05/19/18  9:46 AM. Always use your most recent med list.          albuterol 108 (90  Base) MCG/ACT inhaler Commonly known as:  PROVENTIL HFA;VENTOLIN HFA Inhale 2 puffs every 6 (six) hours as needed into the lungs for wheezing or shortness of breath.   ALPRAZolam 0.25 MG tablet Commonly known as:  XANAX Take 1 tablet (0.25 mg total) by mouth 2 (two) times daily.   cetirizine 10 MG tablet Commonly known as:  ZYRTEC Take 1 tablet (10 mg total) by mouth daily as needed for allergies or rhinitis.   colchicine 0.6 MG tablet TAKE 1 TABLET NOW THEN REPEAT IN 2 HOURS IF NEEDED (MAX OF 2 TABS DAILY)   ergocalciferol 50000 units capsule Commonly known as:  VITAMIN D2 Take by mouth.   gabapentin 300 MG capsule Commonly known as:  NEURONTIN TAKE ONE (1) CAPSULE THREE (3) TIMES EACH DAY   ipratropium-albuterol 0.5-2.5 (3) MG/3ML Soln Commonly known as:  DUONEB Take 3 mLs by nebulization every 6 (six) hours as needed.   levothyroxine 75 MCG tablet Commonly  known as:  SYNTHROID, LEVOTHROID Take 1 tablet (75 mcg total) by mouth daily.   loratadine 10 MG tablet Commonly known as:  CLARITIN Take 1 tablet (10 mg total) by mouth daily.   methylPREDNISolone 4 MG Tbpk tablet Commonly known as:  MEDROL DOSEPAK   omeprazole 40 MG capsule Commonly known as:  PRILOSEC Take 1 capsule (40 mg total) by mouth daily.   Spacer/Aero Chamber Mouthpiece Misc 1 Units 4 (four) times daily by Does not apply route.   TYLENOL ARTHRITIS PAIN PO Take by mouth daily.          Objective:    BP (!) 144/66   Pulse 69   Temp (!) 97 F (36.1 C) (Oral)   Ht 5' 2"  (1.575 m)   Wt 142 lb 12.8 oz (64.8 kg)   BMI 26.12 kg/m   Allergies  Allergen Reactions  . Celebrex [Celecoxib]   . Hydrochlorothiazide   . Lisinopril Swelling  . Sulfa Antibiotics     Had sores in my mouth, bottom of my feet "the skin just came right off"    Wt Readings from Last 3 Encounters:  05/19/18 142 lb 12.8 oz (64.8 kg)  03/10/18 155 lb (70.3 kg)  11/07/17 161 lb (73 kg)    Physical Exam  Constitutional: She is oriented to person, place, and time. She appears well-developed and well-nourished.  HENT:  Head: Normocephalic and atraumatic.  Eyes: Pupils are equal, round, and reactive to light. Conjunctivae and EOM are normal.  Cardiovascular: Normal rate, regular rhythm, normal heart sounds and intact distal pulses.  Pulmonary/Chest: Effort normal and breath sounds normal.  Abdominal: Soft. Bowel sounds are normal.  Neurological: She is alert and oriented to person, place, and time. She has normal reflexes.  Skin: Skin is warm and dry. No rash noted.  Psychiatric: She has a normal mood and affect. Her behavior is normal. Judgment and thought content normal.    Results for orders placed or performed in visit on 04/07/18  TSH  Result Value Ref Range   TSH 1.740 0.450 - 4.500 uIU/mL      Assessment & Plan:   1. Swelling of right index finger Colchicine 1 tablet every  other day  2. Swelling of right middle finger Colchicine 1 tablet every other day  3. Chronic gout without tophus, unspecified cause, unspecified site - CMP14+EGFR; Future - CBC with Differential/Platelet; Future - Uric acid; Future  4. CKD (chronic kidney disease) stage 3, GFR 30-59 ml/min (HCC) - CMP14+EGFR; Future - CBC with Differential/Platelet; Future -  Uric acid; Future  5. 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  Follow up plan: Return in about 2 weeks (around 06/02/2018) for recheck.  Educational handout given for [place] PA-C [facility] [address]  [place], [place] [address] [phone]   05/19/2018, 9:46 AM

## 2018-05-19 NOTE — Patient Instructions (Signed)

## 2018-05-19 NOTE — Telephone Encounter (Signed)
Patient has appointment today with [facility].

## 2018-06-02 ENCOUNTER — Encounter: Payer: Self-pay | Admitting: Physician Assistant

## 2018-06-02 ENCOUNTER — Ambulatory Visit: Payer: Medicare Other | Admitting: Physician Assistant

## 2018-06-02 VITALS — BP 125/75 | HR 84 | Temp 97.5°F | Ht 62.0 in | Wt 138.8 lb

## 2018-06-02 DIAGNOSIS — I10 Essential (primary) hypertension: Secondary | ICD-10-CM

## 2018-06-02 DIAGNOSIS — M1A9XX0 Chronic gout, unspecified, without tophus (tophi): Secondary | ICD-10-CM | POA: Diagnosis not present

## 2018-06-02 DIAGNOSIS — N183 Chronic kidney disease, stage 3 unspecified: Secondary | ICD-10-CM

## 2018-06-02 DIAGNOSIS — E039 Hypothyroidism, unspecified: Secondary | ICD-10-CM | POA: Diagnosis not present

## 2018-06-02 DIAGNOSIS — Z23 Encounter for immunization: Secondary | ICD-10-CM

## 2018-06-03 ENCOUNTER — Other Ambulatory Visit: Payer: Self-pay | Admitting: Physician Assistant

## 2018-06-03 LAB — CBC WITH DIFFERENTIAL/PLATELET
BASOS: 0 %
Basophils Absolute: 0 10*3/uL (ref 0.0–0.2)
EOS (ABSOLUTE): 0.1 10*3/uL (ref 0.0–0.4)
EOS: 3 %
HEMATOCRIT: 31.2 % — AB (ref 34.0–46.6)
Hemoglobin: 10.3 g/dL — ABNORMAL LOW (ref 11.1–15.9)
IMMATURE GRANS (ABS): 0 10*3/uL (ref 0.0–0.1)
IMMATURE GRANULOCYTES: 0 %
LYMPHS: 40 %
Lymphocytes Absolute: 1.9 10*3/uL (ref 0.7–3.1)
MCH: 29.1 pg (ref 26.6–33.0)
MCHC: 33 g/dL (ref 31.5–35.7)
MCV: 88 fL (ref 79–97)
Monocytes Absolute: 0.4 10*3/uL (ref 0.1–0.9)
Monocytes: 8 %
NEUTROS ABS: 2.4 10*3/uL (ref 1.4–7.0)
NEUTROS PCT: 49 %
Platelets: 210 10*3/uL (ref 150–450)
RBC: 3.54 x10E6/uL — ABNORMAL LOW (ref 3.77–5.28)
RDW: 14.1 % (ref 12.3–15.4)
WBC: 4.9 10*3/uL (ref 3.4–10.8)

## 2018-06-03 LAB — CMP14+EGFR
A/G RATIO: 1.8 (ref 1.2–2.2)
ALBUMIN: 4.3 g/dL (ref 3.5–4.8)
ALT: 14 IU/L (ref 0–32)
AST: 25 IU/L (ref 0–40)
Alkaline Phosphatase: 125 IU/L — ABNORMAL HIGH (ref 39–117)
BUN / CREAT RATIO: 9 — AB (ref 12–28)
BUN: 14 mg/dL (ref 8–27)
Bilirubin Total: 1 mg/dL (ref 0.0–1.2)
CALCIUM: 7.6 mg/dL — AB (ref 8.7–10.3)
CO2: 21 mmol/L (ref 20–29)
Chloride: 104 mmol/L (ref 96–106)
Creatinine, Ser: 1.49 mg/dL — ABNORMAL HIGH (ref 0.57–1.00)
GFR calc Af Amer: 39 mL/min/{1.73_m2} — ABNORMAL LOW (ref >59)
GFR, EST NON AFRICAN AMERICAN: 34 mL/min/{1.73_m2} — AB (ref >59)
GLOBULIN, TOTAL: 2.4 g/dL (ref 1.5–4.5)
Glucose: 88 mg/dL (ref 65–99)
Potassium: 4.2 mmol/L (ref 3.5–5.2)
Sodium: 143 mmol/L (ref 134–144)
TOTAL PROTEIN: 6.7 g/dL (ref 6.0–8.5)

## 2018-06-03 LAB — URIC ACID: Uric Acid: 7 mg/dL (ref 2.5–7.1)

## 2018-06-03 LAB — TSH: TSH: 0.263 u[IU]/mL — ABNORMAL LOW (ref 0.450–4.500)

## 2018-06-03 MED ORDER — LEVOTHYROXINE SODIUM 50 MCG PO TABS: 50.0000 ug | ORAL_TABLET | Freq: Every day | ORAL | 1 refills | Status: DC

## 2018-06-03 NOTE — Progress Notes (Signed)
BP 125/75   Pulse 84   Temp (!) 97.5 F (36.4 C) (Oral)   Ht 5' 2" (1.575 m)   Wt 138 lb 12.8 oz (63 kg)   BMI 25.39 kg/m    Subjective:    Patient ID: Christie Bruce, female    DOB: 04/20/1941, 77 y.o.   MRN: [id number]  HPI: Christie Bruce is a 77 y.o. female presenting on 06/02/2018 for Hypertension (2 week follow up )  This patient comes in for a 2-week follow-up on her gouty arthritis and allergic reaction.  She has stopped the lisinopril.  Her blood pressure is doing well.  She is tolerating her medications well.  She has not had any more swelling.  And she does state that with taking the colchicine every other day she has had a great reduction in her gouty reaction.  All of her medications are reviewed and will be refilled  Past Medical History:  Diagnosis Date  . Asthma    small issue and uses inhaler if needed  . Breast cancer ([place])   . Chronic kidney disease   . 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 GERD (gastroesophageal reflux disease)   . GERD (gastroesophageal reflux disease) 09/12/2011  . Gout   . Heart murmur   . Hypertension   . PONV (postoperative nausea and vomiting)   . Thyroid disease   . Uterine prolapse    Relevant past medical, surgical, family and social history reviewed and updated as indicated. Interim medical history since our last visit reviewed. Allergies and medications reviewed and updated. DATA REVIEWED: CHART IN EPIC  Family History reviewed for pertinent findings.  Review of Systems  Constitutional: Negative.  Negative for activity change, fatigue and fever.  HENT: Negative.   Eyes: Negative.   Respiratory: Negative.  Negative for cough.   Cardiovascular: Negative.  Negative for chest pain.  Gastrointestinal: Negative.  Negative for abdominal pain.  Endocrine: Negative.   Genitourinary: Negative.  Negative for dysuria.  Musculoskeletal: Positive for arthralgias and joint swelling.  Skin: Negative.   Neurological:  Negative.     Allergies as of 06/02/2018      Reactions   Celebrex [celecoxib]    Hydrochlorothiazide    Lisinopril Swelling   Sulfa Antibiotics    Had sores in my mouth, bottom of my feet "the skin just came right off"      Medication List        Accurate as of 06/02/18 11:59 PM. Always use your most recent med list.          albuterol 108 (90 Base) MCG/ACT inhaler Commonly known as:  PROVENTIL HFA;VENTOLIN HFA Inhale 2 puffs every 6 (six) hours as needed into the lungs for wheezing or shortness of breath.   ALPRAZolam 0.25 MG tablet Commonly known as:  XANAX Take 1 tablet (0.25 mg total) by mouth 2 (two) times daily.   cetirizine 10 MG tablet Commonly known as:  ZYRTEC Take 1 tablet (10 mg total) by mouth daily as needed for allergies or rhinitis.   colchicine 0.6 MG tablet TAKE 1 TABLET NOW THEN REPEAT IN 2 HOURS IF NEEDED (MAX OF 2 TABS DAILY)   ergocalciferol 1.25 MG (50000 UT) capsule Commonly known as:  VITAMIN D2 Take by mouth.   gabapentin 300 MG capsule Commonly known as:  NEURONTIN TAKE ONE (1) CAPSULE THREE (3) TIMES EACH DAY   ipratropium-albuterol 0.5-2.5 (3) MG/3ML Soln Commonly known as:  DUONEB Take  3 mLs by nebulization every 6 (six) hours as needed.   levothyroxine 75 MCG tablet Commonly known as:  SYNTHROID, LEVOTHROID Take 1 tablet (75 mcg total) by mouth daily.   loratadine 10 MG tablet Commonly known as:  CLARITIN Take 1 tablet (10 mg total) by mouth daily.   omeprazole 40 MG capsule Commonly known as:  PRILOSEC Take 1 capsule (40 mg total) by mouth daily.   Spacer/Aero Chamber Mouthpiece Misc 1 Units 4 (four) times daily by Does not apply route.   TYLENOL ARTHRITIS PAIN PO Take by mouth daily.          Objective:    BP 125/75   Pulse 84   Temp (!) 97.5 F (36.4 C) (Oral)   Ht 5' 2" (1.575 m)   Wt 138 lb 12.8 oz (63 kg)   BMI 25.39 kg/m   Allergies  Allergen Reactions  . Celebrex [Celecoxib]   .  Hydrochlorothiazide   . Lisinopril Swelling  . Sulfa Antibiotics     Had sores in my mouth, bottom of my feet "the skin just came right off"    Wt Readings from Last 3 Encounters:  06/02/18 138 lb 12.8 oz (63 kg)  05/19/18 142 lb 12.8 oz (64.8 kg)  03/10/18 155 lb (70.3 kg)    Physical Exam  Constitutional: She is oriented to person, place, and time. She appears well-developed and well-nourished.  HENT:  Head: Normocephalic and atraumatic.  Eyes: Pupils are equal, round, and reactive to light. Conjunctivae and EOM are normal.  Cardiovascular: Normal rate, regular rhythm, normal heart sounds and intact distal pulses.  Pulmonary/Chest: Effort normal and breath sounds normal.  Abdominal: Soft. Bowel sounds are normal.  Neurological: She is alert and oriented to person, place, and time. She has normal reflexes.  Skin: Skin is warm and dry. No rash noted.  Psychiatric: She has a normal mood and affect. Her behavior is normal. Judgment and thought content normal.    Results for orders placed or performed in visit on 06/02/18  TSH  Result Value Ref Range   TSH 0.263 (L) 0.450 - 4.500 uIU/mL  Uric acid  Result Value Ref Range   Uric Acid 7.0 2.5 - 7.1 mg/dL  CBC with Differential/Platelet  Result Value Ref Range   WBC 4.9 3.4 - 10.8 x10E3/uL   RBC 3.54 (L) 3.77 - 5.28 x10E6/uL   Hemoglobin 10.3 (L) 11.1 - 15.9 g/dL   Hematocrit 31.2 (L) 34.0 - 46.6 %   MCV 88 79 - 97 fL   MCH 29.1 26.6 - 33.0 pg   MCHC 33.0 31.5 - 35.7 g/dL   RDW 14.1 12.3 - 15.4 %   Platelets 210 150 - 450 x10E3/uL   Neutrophils 49 Not Estab. %   Lymphs 40 Not Estab. %   Monocytes 8 Not Estab. %   Eos 3 Not Estab. %   Basos 0 Not Estab. %   Neutrophils Absolute 2.4 1.4 - 7.0 x10E3/uL   Lymphocytes Absolute 1.9 0.7 - 3.1 x10E3/uL   Monocytes Absolute 0.4 0.1 - 0.9 x10E3/uL   EOS (ABSOLUTE) 0.1 0.0 - 0.4 x10E3/uL   Basophils Absolute 0.0 0.0 - 0.2 x10E3/uL   Immature Granulocytes 0 Not Estab. %   Immature  Grans (Abs) 0.0 0.0 - 0.1 x10E3/uL  CMP14+EGFR  Result Value Ref Range   Glucose 88 65 - 99 mg/dL   BUN 14 8 - 27 mg/dL   Creatinine, Ser 1.49 (H) 0.57 - 1.00 mg/dL   GFR   calc non Af Amer 34 (L) >59 mL/min/1.73   GFR calc Af Amer 39 (L) >59 mL/min/1.73   BUN/Creatinine Ratio 9 (L) 12 - 28   Sodium 143 134 - 144 mmol/L   Potassium 4.2 3.5 - 5.2 mmol/L   Chloride 104 96 - 106 mmol/L   CO2 21 20 - 29 mmol/L   Calcium 7.6 (L) 8.7 - 10.3 mg/dL   Total Protein 6.7 6.0 - 8.5 g/dL   Albumin 4.3 3.5 - 4.8 g/dL   Globulin, Total 2.4 1.5 - 4.5 g/dL   Albumin/Globulin Ratio 1.8 1.2 - 2.2   Bilirubin Total 1.0 0.0 - 1.2 mg/dL   Alkaline Phosphatase 125 (H) 39 - 117 IU/L   AST 25 0 - 40 IU/L   ALT 14 0 - 32 IU/L      Assessment & Plan:   1. Hypothyroidism, unspecified type - TSH  2. CKD (chronic kidney disease), stage III (HCC) Continue medications  3. Chronic gout without tophus, unspecified cause, unspecified site - Uric acid - CBC with Differential/Platelet - CMP14+EGFR  4. Essential hypertension Continue medications  5. CKD (chronic kidney disease) stage 3, GFR 30-59 ml/min (HCC) - Uric acid - CBC with Differential/Platelet - CMP14+EGFR  6. Need for immunization against influenza - Flu Vaccine QUAD 36+ mos IM   Continue all other maintenance medications as listed above.  Follow up plan: Return in about 4 weeks (around 06/30/2018) for recheck.  Educational handout given for survey  Angel S. Jones PA-C [facility] [address]  [place], [place] [address] [phone]   06/03/2018, 3:01 PM  

## 2018-06-06 DIAGNOSIS — J4520 Mild intermittent asthma, uncomplicated: Secondary | ICD-10-CM | POA: Diagnosis not present

## 2018-06-13 ENCOUNTER — Encounter: Payer: Self-pay | Admitting: *Deleted

## 2018-07-02 ENCOUNTER — Encounter: Payer: Self-pay | Admitting: Physician Assistant

## 2018-07-02 ENCOUNTER — Ambulatory Visit: Payer: Medicare Other | Admitting: Physician Assistant

## 2018-07-02 VITALS — BP 141/70 | HR 70 | Temp 97.5°F | Ht 62.0 in | Wt 139.4 lb

## 2018-07-02 DIAGNOSIS — M7989 Other specified soft tissue disorders: Secondary | ICD-10-CM | POA: Diagnosis not present

## 2018-07-02 DIAGNOSIS — J0100 Acute maxillary sinusitis, unspecified: Secondary | ICD-10-CM | POA: Diagnosis not present

## 2018-07-02 DIAGNOSIS — M1A9XX0 Chronic gout, unspecified, without tophus (tophi): Secondary | ICD-10-CM

## 2018-07-02 MED ORDER — COLCHICINE 0.6 MG PO TABS: ORAL_TABLET | ORAL | 5 refills | Status: DC

## 2018-07-02 MED ORDER — CETIRIZINE HCL 10 MG PO TABS: 10.0000 mg | ORAL_TABLET | Freq: Every day | ORAL | 11 refills | Status: DC | PRN

## 2018-07-02 NOTE — Progress Notes (Signed)
BP (!) 141/70   Pulse 70   Temp (!) 97.5 F (36.4 C) (Oral)   Ht 5' 2"  (1.575 m)   Wt 139 lb 6.4 oz (63.2 kg)   BMI 25.50 kg/m    Subjective:    Patient ID: Christie Bruce, female    DOB: [birth year], 77 y.o.   MRN: [id number]  HPI: Christie Bruce is a 77 y.o. female presenting on 07/02/2018 for Gout (1 month follow up ) and Hypothyroidism  Christie Bruce comes in today for 1 month recheck on her gout.  She had been taking her colchicine every other day in order to prevent episodes.  However in the couple of weeks after the visit Thanksgiving came in she did change some of her eating habits and she did notice a increase in her gout.  She did go to 1 daily for just a few days.  We had the discussion that she could do that for a flareup.  He did not work fully.  So were going to adjust this to where she takes 1 tablet every other day still and whenever she has a flareup for her to take it 1 daily for at least 1 week.  And on the first day of the exacerbation she can take 2 tablets. . This patient has had many days of sore throat and postnasal drainage, headache at times and sinus pressure. There is copious drainage at times. Denies any fever at this time. There has been a history of sinus infections in the past.  There is cough at night. It has become more prevalent in recent days.  Past Medical History:  Diagnosis Date  . Asthma    small issue and uses inhaler if needed  . Breast cancer ([place])   . Chronic kidney disease   . 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 GERD (gastroesophageal reflux disease)   . GERD (gastroesophageal reflux disease) 09/12/2011  . Gout   . Heart murmur   . Hypertension   . PONV (postoperative nausea and vomiting)   . Thyroid disease   . Uterine prolapse    Relevant past medical, surgical, family and social history reviewed and updated as indicated. Interim medical history since our last visit reviewed. Allergies and medications reviewed and  updated. DATA REVIEWED: CHART IN EPIC  Family History reviewed for pertinent findings.  Review of Systems  Constitutional: Positive for chills. Negative for activity change, appetite change, fatigue and fever.  HENT: Positive for congestion, postnasal drip and sore throat.   Eyes: Negative.   Respiratory: Positive for cough. Negative for wheezing.   Cardiovascular: Negative.  Negative for chest pain, palpitations and leg swelling.  Gastrointestinal: Negative.   Genitourinary: Negative.   Musculoskeletal: Positive for arthralgias and joint swelling.  Skin: Negative.   Neurological: Positive for headaches.    Allergies as of 07/02/2018      Reactions   Celebrex [celecoxib]    Hydrochlorothiazide    Lisinopril Swelling   Sulfa Antibiotics    Had sores in my mouth, bottom of my feet "the skin just came right off"      Medication List        Accurate as of 07/02/18  1:50 PM. Always use your most recent med list.          albuterol 108 (90 Base) MCG/ACT inhaler Commonly known as:  PROVENTIL HFA;VENTOLIN HFA Inhale 2 puffs every 6 (six) hours as needed into the lungs for wheezing  or shortness of breath.   ALPRAZolam 0.25 MG tablet Commonly known as:  XANAX Take 1 tablet (0.25 mg total) by mouth 2 (two) times daily.   cetirizine 10 MG tablet Commonly known as:  ZYRTEC Take 1 tablet (10 mg total) by mouth daily as needed for allergies or rhinitis.   colchicine 0.6 MG tablet TAKE 1 TABLET NOW THEN REPEAT IN 2 HOURS IF NEEDED (MAX OF 2 TABS DAILY)   gabapentin 300 MG capsule Commonly known as:  NEURONTIN TAKE ONE (1) CAPSULE THREE (3) TIMES EACH DAY   ipratropium-albuterol 0.5-2.5 (3) MG/3ML Soln Commonly known as:  DUONEB Take 3 mLs by nebulization every 6 (six) hours as needed.   levothyroxine 50 MCG tablet Commonly known as:  SYNTHROID, LEVOTHROID Take 1 tablet (50 mcg total) by mouth daily.   omeprazole 40 MG capsule Commonly known as:  PRILOSEC Take 1 capsule  (40 mg total) by mouth daily.   Spacer/Aero Chamber Mouthpiece Misc 1 Units 4 (four) times daily by Does not apply route.          Objective:    BP (!) 141/70   Pulse 70   Temp (!) 97.5 F (36.4 C) (Oral)   Ht 5' 2"  (1.575 m)   Wt 139 lb 6.4 oz (63.2 kg)   BMI 25.50 kg/m   Allergies  Allergen Reactions  . Celebrex [Celecoxib]   . Hydrochlorothiazide   . Lisinopril Swelling  . Sulfa Antibiotics     Had sores in my mouth, bottom of my feet "the skin just came right off"    Wt Readings from Last 3 Encounters:  07/02/18 139 lb 6.4 oz (63.2 kg)  06/02/18 138 lb 12.8 oz (63 kg)  05/19/18 142 lb 12.8 oz (64.8 kg)    Physical Exam  Constitutional: She is oriented to person, place, and time. She appears well-developed and well-nourished.  HENT:  Head: Normocephalic and atraumatic.  Right Ear: A middle ear effusion is present.  Left Ear: A middle ear effusion is present.  Nose: Mucosal edema present. Right sinus exhibits no frontal sinus tenderness. Left sinus exhibits no frontal sinus tenderness.  Mouth/Throat: Posterior oropharyngeal erythema present. No oropharyngeal exudate or tonsillar abscesses.  Eyes: Pupils are equal, round, and reactive to light. Conjunctivae and EOM are normal.  Neck: Normal range of motion.  Cardiovascular: Normal rate, regular rhythm, normal heart sounds and intact distal pulses.  Pulmonary/Chest: Effort normal and breath sounds normal.  Abdominal: Soft. Bowel sounds are normal.  Musculoskeletal:       Left hand: She exhibits decreased range of motion and swelling.       Hands: Neurological: She is alert and oriented to person, place, and time. She has normal reflexes.  Skin: Skin is warm and dry. No rash noted.  Psychiatric: She has a normal mood and affect. Her behavior is normal. Judgment and thought content normal.  Nursing note and vitals reviewed.   Results for orders placed or performed in visit on 06/02/18  TSH  Result Value Ref  Range   TSH 0.263 (L) 0.450 - 4.500 uIU/mL  Uric acid  Result Value Ref Range   Uric Acid 7.0 2.5 - 7.1 mg/dL  CBC with Differential/Platelet  Result Value Ref Range   WBC 4.9 3.4 - 10.8 x10E3/uL   RBC 3.54 (L) 3.77 - 5.28 x10E6/uL   Hemoglobin 10.3 (L) 11.1 - 15.9 g/dL   Hematocrit 31.2 (L) 34.0 - 46.6 %   MCV 88 79 - 97 fL  MCH 29.1 26.6 - 33.0 pg   MCHC 33.0 31.5 - 35.7 g/dL   RDW 14.1 12.3 - 15.4 %   Platelets 210 150 - 450 x10E3/uL   Neutrophils 49 Not Estab. %   Lymphs 40 Not Estab. %   Monocytes 8 Not Estab. %   Eos 3 Not Estab. %   Basos 0 Not Estab. %   Neutrophils Absolute 2.4 1.4 - 7.0 x10E3/uL   Lymphocytes Absolute 1.9 0.7 - 3.1 x10E3/uL   Monocytes Absolute 0.4 0.1 - 0.9 x10E3/uL   EOS (ABSOLUTE) 0.1 0.0 - 0.4 x10E3/uL   Basophils Absolute 0.0 0.0 - 0.2 x10E3/uL   Immature Granulocytes 0 Not Estab. %   Immature Grans (Abs) 0.0 0.0 - 0.1 x10E3/uL  CMP14+EGFR  Result Value Ref Range   Glucose 88 65 - 99 mg/dL   BUN 14 8 - 27 mg/dL   Creatinine, Ser 1.49 (H) 0.57 - 1.00 mg/dL   GFR calc non Af Amer 34 (L) >59 mL/min/1.73   GFR calc Af Amer 39 (L) >59 mL/min/1.73   BUN/Creatinine Ratio 9 (L) 12 - 28   Sodium 143 134 - 144 mmol/L   Potassium 4.2 3.5 - 5.2 mmol/L   Chloride 104 96 - 106 mmol/L   CO2 21 20 - 29 mmol/L   Calcium 7.6 (L) 8.7 - 10.3 mg/dL   Total Protein 6.7 6.0 - 8.5 g/dL   Albumin 4.3 3.5 - 4.8 g/dL   Globulin, Total 2.4 1.5 - 4.5 g/dL   Albumin/Globulin Ratio 1.8 1.2 - 2.2   Bilirubin Total 1.0 0.0 - 1.2 mg/dL   Alkaline Phosphatase 125 (H) 39 - 117 IU/L   AST 25 0 - 40 IU/L   ALT 14 0 - 32 IU/L      Assessment & Plan:   1. Swelling of right index finger - colchicine 0.6 MG tablet; TAKE 1 TABLET NOW THEN REPEAT IN 2 HOURS IF NEEDED (MAX OF 2 TABS DAILY)  Dispense: 30 tablet; Refill: 5  2. Swelling of right middle finger - colchicine 0.6 MG tablet; TAKE 1 TABLET NOW THEN REPEAT IN 2 HOURS IF NEEDED (MAX OF 2 TABS DAILY)  Dispense: 30  tablet; Refill: 5  3. Chronic gout without tophus, unspecified cause, unspecified site Continue colchicine  4. Acute maxillary sinusitis, recurrence not specified - cetirizine (ZYRTEC) 10 MG tablet; Take 1 tablet (10 mg total) by mouth daily as needed for allergies or rhinitis.  Dispense: 30 tablet; Refill: 11   Continue all other maintenance medications as listed above.  Follow up plan: Return in about 2 months (around 09/02/2018) for recheck and labs.  Educational handout given for [place] PA-C [facility] [address]  [place], [place] [address] [phone]   07/02/2018, 1:50 PM

## 2018-07-02 NOTE — Patient Instructions (Signed)

## 2018-07-06 DIAGNOSIS — J4520 Mild intermittent asthma, uncomplicated: Secondary | ICD-10-CM | POA: Diagnosis not present

## 2018-07-10 ENCOUNTER — Other Ambulatory Visit: Payer: Self-pay

## 2018-07-10 ENCOUNTER — Encounter (INDEPENDENT_AMBULATORY_CARE_PROVIDER_SITE_OTHER): Payer: Self-pay

## 2018-07-10 ENCOUNTER — Ambulatory Visit: Payer: Medicare Other | Admitting: Obstetrics & Gynecology

## 2018-07-10 ENCOUNTER — Encounter: Payer: Self-pay | Admitting: Obstetrics & Gynecology

## 2018-07-10 VITALS — BP 168/85 | HR 77 | Ht 62.5 in | Wt 142.0 lb

## 2018-07-10 DIAGNOSIS — N813 Complete uterovaginal prolapse: Secondary | ICD-10-CM | POA: Diagnosis not present

## 2018-07-10 DIAGNOSIS — Z4689 Encounter for fitting and adjustment of other specified devices: Secondary | ICD-10-CM | POA: Diagnosis not present

## 2018-07-10 NOTE — Progress Notes (Signed)
Chief Complaint  Patient presents with  . pessary maintenance    Blood pressure (!) 168/85, pulse 77, height 5' 2.5" (1.588 m), weight 142 lb (64.4 kg).  Christie Bruce presents today for routine follow up related to her pessary.   She uses a Milex ring with support #2 She reports no vaginal discharge or vaginal bleeding.  Exam reveals no undue vaginal mucosal pressure of breakdown, no discharge and no vaginal bleeding.  The pessary is removed, cleaned and replaced without difficulty.    Christie Bruce will be sen back in 4 months for continued follow up.  Florian Buff, MD  07/10/2018 11:09 AM

## 2018-07-21 ENCOUNTER — Encounter: Payer: Self-pay | Admitting: Family Medicine

## 2018-07-21 ENCOUNTER — Ambulatory Visit: Payer: Medicare Other | Admitting: Family Medicine

## 2018-07-21 VITALS — BP 172/99 | HR 103 | Temp 97.5°F | Ht 62.5 in | Wt 137.8 lb

## 2018-07-21 DIAGNOSIS — J0100 Acute maxillary sinusitis, unspecified: Secondary | ICD-10-CM | POA: Diagnosis not present

## 2018-07-21 MED ORDER — CEFUROXIME AXETIL 250 MG PO TABS: 250.0000 mg | ORAL_TABLET | Freq: Two times a day (BID) | ORAL | 0 refills | Status: AC

## 2018-07-21 MED ORDER — BETAMETHASONE SOD PHOS & ACET 6 (3-3) MG/ML IJ SUSP: 6.0000 mg | Freq: Once | INTRAMUSCULAR | Status: DC

## 2018-07-21 NOTE — Progress Notes (Signed)
Chief Complaint  Patient presents with  . cough and congestion    HPI  Patient presents today for Symptoms include congestion, facial pain, nasal congestion, occasional non productive cough, post nasal drip and sinus pressure. There is no fever, chills, or sweats. Onset of symptoms was a few days ago, gradually worsening since that time.    PMH: Smoking status noted ROS: Per HPI  Objective: BP (!) 172/99   Pulse (!) 103   Temp (!) 97.5 F (36.4 C) (Oral)   Ht 5' 2.5" (1.588 m)   Wt 137 lb 12.8 oz (62.5 kg)   SpO2 96%   BMI 24.80 kg/m  Gen: NAD, alert, cooperative with exam HEENT: NCAT, EOMI, PERRL. Left max tenderness CV: RRR, good S1/S2, no murmur Resp: CTABL, no wheezes, non-labored Abd: SNTND, BS present, no guarding or organomegaly Ext: No edema, warm Neuro: Alert and oriented, No gross deficits  Assessment and plan:  1. Acute maxillary sinusitis, recurrence not specified     Meds ordered this encounter  Medications  . betamethasone acetate-betamethasone sodium phosphate (CELESTONE) injection 6 mg  . cefUROXime (CEFTIN) 250 MG tablet    Sig: Take 1 tablet (250 mg total) by mouth 2 (two) times daily with a meal for 10 days.    Dispense:  20 tablet    Refill:  0    No orders of the defined types were placed in this encounter.   Follow up as needed.  Claretta Fraise, MD

## 2018-08-30 ENCOUNTER — Other Ambulatory Visit: Payer: Self-pay | Admitting: Physician Assistant

## 2018-09-17 ENCOUNTER — Telehealth: Payer: Self-pay | Admitting: Physician Assistant

## 2018-09-17 NOTE — Telephone Encounter (Signed)
LM Due for AWV °

## 2018-10-03 ENCOUNTER — Encounter: Payer: Self-pay | Admitting: Physician Assistant

## 2018-10-03 ENCOUNTER — Ambulatory Visit (INDEPENDENT_AMBULATORY_CARE_PROVIDER_SITE_OTHER): Payer: Medicare Other | Admitting: Physician Assistant

## 2018-10-03 ENCOUNTER — Other Ambulatory Visit: Payer: Self-pay

## 2018-10-03 VITALS — BP 175/78 | HR 83 | Temp 98.2°F | Ht 62.5 in | Wt 131.2 lb

## 2018-10-03 DIAGNOSIS — I10 Essential (primary) hypertension: Secondary | ICD-10-CM

## 2018-10-03 DIAGNOSIS — F419 Anxiety disorder, unspecified: Secondary | ICD-10-CM

## 2018-10-03 DIAGNOSIS — J40 Bronchitis, not specified as acute or chronic: Secondary | ICD-10-CM | POA: Diagnosis not present

## 2018-10-03 DIAGNOSIS — E039 Hypothyroidism, unspecified: Secondary | ICD-10-CM

## 2018-10-03 MED ORDER — PREDNISONE 10 MG (21) PO TBPK: ORAL_TABLET | ORAL | 0 refills | Status: DC

## 2018-10-03 MED ORDER — ALPRAZOLAM 0.25 MG PO TABS: 0.2500 mg | ORAL_TABLET | Freq: Two times a day (BID) | ORAL | 5 refills | Status: DC

## 2018-10-03 MED ORDER — CEFDINIR 300 MG PO CAPS: 300.0000 mg | ORAL_CAPSULE | Freq: Two times a day (BID) | ORAL | 0 refills | Status: DC

## 2018-10-03 MED ORDER — ALBUTEROL SULFATE HFA 108 (90 BASE) MCG/ACT IN AERS: 2.0000 | INHALATION_SPRAY | Freq: Four times a day (QID) | RESPIRATORY_TRACT | 0 refills | Status: AC | PRN

## 2018-10-03 MED ORDER — AMLODIPINE BESYLATE 5 MG PO TABS: 5.0000 mg | ORAL_TABLET | Freq: Every day | ORAL | 3 refills | Status: DC

## 2018-10-04 LAB — CMP14+EGFR
A/G RATIO: 1.9 (ref 1.2–2.2)
ALT: 10 IU/L (ref 0–32)
AST: 21 IU/L (ref 0–40)
Albumin: 4.5 g/dL (ref 3.7–4.7)
Alkaline Phosphatase: 113 IU/L (ref 39–117)
BUN/Creatinine Ratio: 12 (ref 12–28)
BUN: 14 mg/dL (ref 8–27)
Bilirubin Total: 1 mg/dL (ref 0.0–1.2)
CALCIUM: 8.6 mg/dL — AB (ref 8.7–10.3)
CO2: 23 mmol/L (ref 20–29)
Chloride: 101 mmol/L (ref 96–106)
Creatinine, Ser: 1.15 mg/dL — ABNORMAL HIGH (ref 0.57–1.00)
GFR calc Af Amer: 53 mL/min/{1.73_m2} — ABNORMAL LOW (ref >59)
GFR calc non Af Amer: 46 mL/min/{1.73_m2} — ABNORMAL LOW (ref >59)
Globulin, Total: 2.4 g/dL (ref 1.5–4.5)
Glucose: 80 mg/dL (ref 65–99)
Potassium: 3.9 mmol/L (ref 3.5–5.2)
Sodium: 141 mmol/L (ref 134–144)
Total Protein: 6.9 g/dL (ref 6.0–8.5)

## 2018-10-04 LAB — THYROID PANEL WITH TSH
Free Thyroxine Index: 1.5 (ref 1.2–4.9)
T3 UPTAKE RATIO: 22 % — AB (ref 24–39)
T4, Total: 6.9 ug/dL (ref 4.5–12.0)
TSH: 6.69 u[IU]/mL — ABNORMAL HIGH (ref 0.450–4.500)

## 2018-10-05 ENCOUNTER — Other Ambulatory Visit: Payer: Self-pay | Admitting: Physician Assistant

## 2018-10-05 MED ORDER — LEVOTHYROXINE SODIUM 75 MCG PO TABS: 75.0000 ug | ORAL_TABLET | Freq: Every day | ORAL | 1 refills | Status: DC

## 2018-10-06 NOTE — Progress Notes (Signed)
BP (!) 175/78   Pulse 83   Temp 98.2 F (36.8 C) (Oral)   Ht 5' 2.5" (1.588 m)   Wt 131 lb 3.2 oz (59.5 kg)   BMI 23.61 kg/m    Subjective:    Patient ID: Christie Bruce, female    DOB: Jul 08, 1941, 78 y.o.   MRN: [id number]  HPI: Christie Bruce is a 78 y.o. female presenting on 10/03/2018 for Cough and Ear Pain  Patient with several days of progressing upper respiratory and bronchial symptoms. Initially there was more upper respiratory congestion. This progressed to having significant cough that is productive throughout the day and severe at night. There is occasional wheezing after coughing. Sometimes there is slight dyspnea on exertion. It is productive mucus that is yellow in color. Denies any blood.  ANXIETY ASSESSMENT Cause of anxiety: Anxiety disorder This patient returns for a 3 month recheck on narcotic use for the above named condition(s)  Current medications-alprazolam 0.25 mg taken up to twice daily Other medications tried: Many SSRIs Medication side effects-none Any concerns-none Any change in general medical condition-none Effectiveness of current meds-good PMP AWARE website reviewed: Yes Any suspicious activity on PMP Aware: No LME daily dose: 1  Contract on file Last UDS  Perform at next visit. Patient is sick today  History of overdose or risk of abuse no   Past Medical History:  Diagnosis Date  . Asthma    small issue and uses inhaler if needed  . Breast cancer ([place])   . Chronic kidney disease   . 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 GERD (gastroesophageal reflux disease)   . GERD (gastroesophageal reflux disease) 09/12/2011  . Gout   . Heart murmur   . Hypertension   . PONV (postoperative nausea and vomiting)   . Thyroid disease   . Uterine prolapse    Relevant past medical, surgical, family and social history reviewed and updated as indicated. Interim medical history since our last visit reviewed. Allergies and medications  reviewed and updated. DATA REVIEWED: CHART IN EPIC  Family History reviewed for pertinent findings.  Review of Systems  Constitutional: Positive for chills and fatigue. Negative for activity change and appetite change.  HENT: Positive for congestion, postnasal drip and sore throat.   Eyes: Negative.   Respiratory: Positive for cough and wheezing.   Cardiovascular: Negative.  Negative for chest pain, palpitations and leg swelling.  Gastrointestinal: Negative.   Genitourinary: Negative.   Musculoskeletal: Negative.   Skin: Negative.   Neurological: Positive for headaches.    Allergies as of 10/03/2018      Reactions   Celebrex [celecoxib]    Hydrochlorothiazide    Lisinopril Swelling   Sulfa Antibiotics    Had sores in my mouth, bottom of my feet "the skin just came right off"      Medication List       Accurate as of October 03, 2018 11:59 PM. Always use your most recent med list.        albuterol 108 (90 Base) MCG/ACT inhaler Commonly known as:  PROVENTIL HFA;VENTOLIN HFA Inhale 2 puffs into the lungs every 6 (six) hours as needed for wheezing or shortness of breath.   ALPRAZolam 0.25 MG tablet Commonly known as:  XANAX Take 1 tablet (0.25 mg total) by mouth 2 (two) times daily.   amLODipine 5 MG tablet Commonly known as:  NORVASC Take 1 tablet (5 mg total) by mouth daily.   cefdinir 300 MG capsule  Commonly known as:  OMNICEF Take 1 capsule (300 mg total) by mouth 2 (two) times daily. 1 po BID   cetirizine 10 MG tablet Commonly known as:  ZYRTEC Take 1 tablet (10 mg total) by mouth daily as needed for allergies or rhinitis.   colchicine 0.6 MG tablet TAKE 1 TABLET NOW THEN REPEAT IN 2 HOURS IF NEEDED (MAX OF 2 TABS DAILY)   gabapentin 300 MG capsule Commonly known as:  NEURONTIN TAKE ONE (1) CAPSULE THREE (3) TIMES EACH DAY   ipratropium-albuterol 0.5-2.5 (3) MG/3ML Soln Commonly known as:  DUONEB Take 3 mLs by nebulization every 6 (six) hours as needed.    levothyroxine 50 MCG tablet Commonly known as:  SYNTHROID, LEVOTHROID Take 1 tablet (50 mcg total) by mouth daily.   omeprazole 40 MG capsule Commonly known as:  PRILOSEC Take 1 capsule (40 mg total) by mouth daily.   predniSONE 10 MG (21) Tbpk tablet Commonly known as:  STERAPRED UNI-PAK 21 TAB As directed x 6 days   Spacer/Aero Chamber Mouthpiece Misc 1 Units 4 (four) times daily by Does not apply route.          Objective:    BP (!) 175/78   Pulse 83   Temp 98.2 F (36.8 C) (Oral)   Ht 5' 2.5" (1.588 m)   Wt 131 lb 3.2 oz (59.5 kg)   BMI 23.61 kg/m   Allergies  Allergen Reactions  . Celebrex [Celecoxib]   . Hydrochlorothiazide   . Lisinopril Swelling  . Sulfa Antibiotics     Had sores in my mouth, bottom of my feet "the skin just came right off"    Wt Readings from Last 3 Encounters:  10/03/18 131 lb 3.2 oz (59.5 kg)  07/21/18 137 lb 12.8 oz (62.5 kg)  07/10/18 142 lb (64.4 kg)    Physical Exam Constitutional:      Appearance: She is well-developed.  HENT:     Head: Normocephalic and atraumatic.     Right Ear: Drainage and tenderness present.     Left Ear: Drainage and tenderness present.     Nose: Mucosal edema and rhinorrhea present.     Right Sinus: No maxillary sinus tenderness or frontal sinus tenderness.     Left Sinus: No maxillary sinus tenderness or frontal sinus tenderness.     Mouth/Throat:     Pharynx: Oropharyngeal exudate and posterior oropharyngeal erythema present.  Eyes:     Conjunctiva/sclera: Conjunctivae normal.     Pupils: Pupils are equal, round, and reactive to light.  Neck:     Musculoskeletal: Normal range of motion and neck supple.  Cardiovascular:     Rate and Rhythm: Normal rate and regular rhythm.     Heart sounds: Normal heart sounds.  Pulmonary:     Effort: Pulmonary effort is normal.     Breath sounds: Examination of the right-upper field reveals wheezing. Examination of the left-upper field reveals wheezing.  Wheezing present.  Abdominal:     General: Bowel sounds are normal.     Palpations: Abdomen is soft.  Skin:    General: Skin is warm and dry.     Findings: No rash.  Neurological:     Mental Status: She is alert and oriented to person, place, and time.     Deep Tendon Reflexes: Reflexes are normal and symmetric.  Psychiatric:        Behavior: Behavior normal.        Thought Content: Thought content normal.  Judgment: Judgment normal.     Results for orders placed or performed in visit on 10/03/18  Thyroid Panel With TSH  Result Value Ref Range   TSH 6.690 (H) 0.450 - 4.500 uIU/mL   T4, Total 6.9 4.5 - 12.0 ug/dL   T3 Uptake Ratio 22 (L) 24 - 39 %   Free Thyroxine Index 1.5 1.2 - 4.9  CMP14+EGFR  Result Value Ref Range   Glucose 80 65 - 99 mg/dL   BUN 14 8 - 27 mg/dL   Creatinine, Ser 1.15 (H) 0.57 - 1.00 mg/dL   GFR calc non Af Amer 46 (L) >59 mL/min/1.73   GFR calc Af Amer 53 (L) >59 mL/min/1.73   BUN/Creatinine Ratio 12 12 - 28   Sodium 141 134 - 144 mmol/L   Potassium 3.9 3.5 - 5.2 mmol/L   Chloride 101 96 - 106 mmol/L   CO2 23 20 - 29 mmol/L   Calcium 8.6 (L) 8.7 - 10.3 mg/dL   Total Protein 6.9 6.0 - 8.5 g/dL   Albumin 4.5 3.7 - 4.7 g/dL   Globulin, Total 2.4 1.5 - 4.5 g/dL   Albumin/Globulin Ratio 1.9 1.2 - 2.2   Bilirubin Total 1.0 0.0 - 1.2 mg/dL   Alkaline Phosphatase 113 39 - 117 IU/L   AST 21 0 - 40 IU/L   ALT 10 0 - 32 IU/L      Assessment & Plan:   1. Bronchitis - albuterol (PROVENTIL HFA;VENTOLIN HFA) 108 (90 Base) MCG/ACT inhaler; Inhale 2 puffs into the lungs every 6 (six) hours as needed for wheezing or shortness of breath.  Dispense: 1 Inhaler; Refill: 0 - predniSONE (STERAPRED UNI-PAK 21 TAB) 10 MG (21) TBPK tablet; As directed x 6 days  Dispense: 21 tablet; Refill: 0 - cefdinir (OMNICEF) 300 MG capsule; Take 1 capsule (300 mg total) by mouth 2 (two) times daily. 1 po BID  Dispense: 20 capsule; Refill: 0  2. Anxiety - ALPRAZolam (XANAX)  0.25 MG tablet; Take 1 tablet (0.25 mg total) by mouth 2 (two) times daily.  Dispense: 60 tablet; Refill: 5  3. Essential hypertension - amLODipine (NORVASC) 5 MG tablet; Take 1 tablet (5 mg total) by mouth daily.  Dispense: 90 tablet; Refill: 3 - CMP14+EGFR  4. Hypothyroidism, unspecified type - Thyroid Panel With TSH   Continue all other maintenance medications as listed above.  Follow up plan: Return in about 3 months (around 01/03/2019).  Educational handout given for [place] PA-C [place] [address]  [place], [place] [address] [phone]   10/06/2018, 12:24 AM

## 2018-11-10 ENCOUNTER — Ambulatory Visit: Payer: Self-pay | Admitting: Obstetrics & Gynecology

## 2018-11-12 ENCOUNTER — Telehealth: Payer: Self-pay | Admitting: *Deleted

## 2018-11-12 NOTE — Telephone Encounter (Signed)

## 2018-11-13 ENCOUNTER — Encounter: Payer: Self-pay | Admitting: Obstetrics & Gynecology

## 2018-11-13 ENCOUNTER — Other Ambulatory Visit: Payer: Self-pay

## 2018-11-13 ENCOUNTER — Ambulatory Visit: Payer: Medicare Other | Admitting: Obstetrics & Gynecology

## 2018-11-13 VITALS — BP 153/74 | HR 83 | Ht 62.5 in | Wt 142.0 lb

## 2018-11-13 DIAGNOSIS — N813 Complete uterovaginal prolapse: Secondary | ICD-10-CM | POA: Diagnosis not present

## 2018-11-13 DIAGNOSIS — Z4689 Encounter for fitting and adjustment of other specified devices: Secondary | ICD-10-CM

## 2018-11-13 NOTE — Progress Notes (Signed)
Chief Complaint  Patient presents with  . Pessary Check    Blood pressure (!) 153/74, pulse 83, height 5' 2.5" (1.588 m), weight 142 lb (64.4 kg).  Christie Bruce presents today for routine follow up related to her pessary.   She uses a Milex ring with support #2 She reports no vaginal discharge or vaginal bleeding.  Exam reveals no undue vaginal mucosal pressure of breakdown, no discharge and no vaginal bleeding.  The pessary is removed, cleaned and replaced without difficulty.    Christie Bruce will be sen back in 4 months for continued follow up.  Florian Buff, MD  11/13/2018 2:10 PM

## 2018-11-16 ENCOUNTER — Encounter: Payer: Self-pay | Admitting: Obstetrics & Gynecology

## 2018-12-17 ENCOUNTER — Other Ambulatory Visit: Payer: Self-pay | Admitting: Physician Assistant

## 2018-12-17 DIAGNOSIS — K219 Gastro-esophageal reflux disease without esophagitis: Secondary | ICD-10-CM

## 2018-12-24 ENCOUNTER — Other Ambulatory Visit: Payer: Self-pay | Admitting: Physician Assistant

## 2018-12-24 DIAGNOSIS — M7989 Other specified soft tissue disorders: Secondary | ICD-10-CM

## 2019-03-16 ENCOUNTER — Ambulatory Visit: Payer: Medicare Other | Admitting: Obstetrics & Gynecology

## 2019-03-23 ENCOUNTER — Ambulatory Visit: Payer: Medicare Other | Admitting: Obstetrics & Gynecology

## 2019-04-02 ENCOUNTER — Other Ambulatory Visit: Payer: Self-pay

## 2019-04-02 ENCOUNTER — Ambulatory Visit: Payer: Medicare Other | Admitting: Obstetrics & Gynecology

## 2019-04-02 ENCOUNTER — Encounter: Payer: Self-pay | Admitting: Obstetrics & Gynecology

## 2019-04-02 VITALS — BP 157/78 | HR 78 | Ht 62.2 in | Wt 140.0 lb

## 2019-04-02 DIAGNOSIS — N813 Complete uterovaginal prolapse: Secondary | ICD-10-CM | POA: Diagnosis not present

## 2019-04-02 DIAGNOSIS — Z4689 Encounter for fitting and adjustment of other specified devices: Secondary | ICD-10-CM | POA: Diagnosis not present

## 2019-04-02 NOTE — Progress Notes (Signed)
Chief Complaint  Patient presents with  . Pessary Check    cleaning    Blood pressure (!) 157/78, pulse 78, height 5' 2.2" (1.58 m), weight 140 lb (63.5 kg).  Christie Bruce presents today for routine follow up related to her pessary.   She uses a Milex ring with support #2 She reports no vaginal discharge or vaginal bleeding.  Exam reveals no undue vaginal mucosal pressure of breakdown, no discharge and no vaginal bleeding.  The pessary is removed, cleaned and replaced without difficulty.    Christie Bruce will be sen back in 4 months for continued follow up.  Florian Buff, MD  04/02/2019 11:22 AM

## 2019-04-09 ENCOUNTER — Other Ambulatory Visit: Payer: Self-pay | Admitting: Physician Assistant

## 2019-04-09 DIAGNOSIS — F419 Anxiety disorder, unspecified: Secondary | ICD-10-CM

## 2019-06-05 ENCOUNTER — Other Ambulatory Visit: Payer: Self-pay | Admitting: Physician Assistant

## 2019-06-05 DIAGNOSIS — F419 Anxiety disorder, unspecified: Secondary | ICD-10-CM

## 2019-07-09 ENCOUNTER — Other Ambulatory Visit: Payer: Self-pay | Admitting: Physician Assistant

## 2019-07-09 DIAGNOSIS — M7989 Other specified soft tissue disorders: Secondary | ICD-10-CM

## 2019-07-14 ENCOUNTER — Other Ambulatory Visit: Payer: Self-pay | Admitting: Physician Assistant

## 2019-07-27 ENCOUNTER — Other Ambulatory Visit: Payer: Self-pay | Admitting: Physician Assistant

## 2019-07-27 DIAGNOSIS — I10 Essential (primary) hypertension: Secondary | ICD-10-CM

## 2019-07-28 ENCOUNTER — Other Ambulatory Visit: Payer: Self-pay | Admitting: Physician Assistant

## 2019-07-28 DIAGNOSIS — F419 Anxiety disorder, unspecified: Secondary | ICD-10-CM

## 2019-08-03 ENCOUNTER — Other Ambulatory Visit: Payer: Self-pay

## 2019-08-03 ENCOUNTER — Ambulatory Visit (INDEPENDENT_AMBULATORY_CARE_PROVIDER_SITE_OTHER): Payer: Medicare Other | Admitting: Physician Assistant

## 2019-08-03 ENCOUNTER — Ambulatory Visit: Payer: Medicare Other | Admitting: Obstetrics & Gynecology

## 2019-08-03 DIAGNOSIS — I10 Essential (primary) hypertension: Secondary | ICD-10-CM | POA: Diagnosis not present

## 2019-08-03 DIAGNOSIS — E039 Hypothyroidism, unspecified: Secondary | ICD-10-CM

## 2019-08-03 DIAGNOSIS — J40 Bronchitis, not specified as acute or chronic: Secondary | ICD-10-CM

## 2019-08-03 DIAGNOSIS — M1A9XX0 Chronic gout, unspecified, without tophus (tophi): Secondary | ICD-10-CM | POA: Diagnosis not present

## 2019-08-03 DIAGNOSIS — F419 Anxiety disorder, unspecified: Secondary | ICD-10-CM | POA: Diagnosis not present

## 2019-08-03 MED ORDER — ALPRAZOLAM 0.25 MG PO TABS: 0.2500 mg | ORAL_TABLET | Freq: Two times a day (BID) | ORAL | 1 refills | Status: DC

## 2019-08-03 MED ORDER — PREDNISONE 10 MG (21) PO TBPK: ORAL_TABLET | ORAL | 0 refills | Status: DC

## 2019-08-03 MED ORDER — LORATADINE 10 MG PO TABS: 10.0000 mg | ORAL_TABLET | Freq: Every day | ORAL | 11 refills | Status: DC

## 2019-08-03 NOTE — Progress Notes (Signed)
Telephone visit  Subjective: CC: Back pain, fatigue, joint pain PCP: Terald Sleeper, PA-C RCV:Christie Bruce is a 79 y.o. female calls for telephone consult today. Patient provides verbal consent for consult held via phone.  Patient is identified with 2 separate identifiers.  At this time the entire area is on COVID-19 social distancing and stay home orders are in place.  Patient is of higher risk and therefore we are performing this by a virtual method.  Location of patient: Home Location of provider: HOME Others present for call: No    This patient is having a telephone visit for the pain that she has been having in her back.  She does have an old history of a broken hip and she does sometimes feel tight in that joint and up her back.  However this time she just rolled over in bed and had significant pain.  This occurred about 7 days ago.  And it will go up and down her spine.  She is very stiff in the mornings.  She has not had any other illness.  She did have a Covid test on 07/20/2019 that was negative.  She sometimes will have a headache.  The patient does also have hypothyroidism, gout, and chronic kidney disease.  She does need to come in for labs in the near future and at that time also urine drug screen will be performed in addition to a microalbumin.  Patient also has chronic anxiety.  The PDMP is within normal limits.  We will have her come in in the next couple of months. Contract 10/06/2018 UDS at next lab work  ROS: Per HPI  Allergies  Allergen Reactions  . Celebrex [Celecoxib]   . Hydrochlorothiazide   . Lisinopril Swelling  . Sulfa Antibiotics     Had sores in my mouth, bottom of my feet "the skin just came right off"   Past Medical History:  Diagnosis Date  . Asthma    small issue and uses inhaler if needed  . Breast cancer ([place])   . Chronic kidney disease   . 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 GERD (gastroesophageal reflux  disease) 09/12/2011  . Gout   . Heart murmur   . Hypertension   . PONV (postoperative nausea and vomiting)   . Thyroid disease   . Uterine prolapse     Current Outpatient Medications:  .  albuterol (PROVENTIL HFA;VENTOLIN HFA) 108 (90 Base) MCG/ACT inhaler, Inhale 2 puffs into the lungs every 6 (six) hours as needed for wheezing or shortness of breath., Disp: 1 Inhaler, Rfl: 0 .  ALPRAZolam (XANAX) 0.25 MG tablet, Take 1 tablet (0.25 mg total) by mouth 2 (two) times daily., Disp: 60 tablet, Rfl: 1 .  amLODipine (NORVASC) 5 MG tablet, TAKE ONE (1) TABLET EACH DAY, Disp: 90 tablet, Rfl: 0 .  colchicine 0.6 MG tablet, TAKE 1 TABLET NOW THEN REPEAT IN 2 HOURSIF NEEDED (MAX OF 2 TABLETS DAILY), Disp: 30 tablet, Rfl: 5 .  gabapentin (NEURONTIN) 300 MG capsule, TAKE ONE (1) CAPSULE THREE (3) TIMES EACH DAY, Disp: 270 capsule, Rfl: 5 .  ipratropium-albuterol (DUONEB) 0.5-2.5 (3) MG/3ML SOLN, Take 3 mLs by nebulization every 6 (six) hours as needed., Disp: 360 mL, Rfl: 6 .  levothyroxine (SYNTHROID) 75 MCG tablet, TAKE ONE (1) TABLET EACH DAY, Disp: 90 tablet, Rfl: 0 .  omeprazole (PRILOSEC) 40 MG capsule, TAKE ONE (1) CAPSULE EACH DAY, Disp: 90 capsule, Rfl: 3 .  Spacer/Aero Chamber Mouthpiece MISC, 1 Units 4 (four) times daily by Does not apply route., Disp: 1 each, Rfl: 0 .  loratadine (CLARITIN) 10 MG tablet, Take 1 tablet (10 mg total) by mouth daily., Disp: 30 tablet, Rfl: 11 .  predniSONE (STERAPRED UNI-PAK 21 TAB) 10 MG (21) TBPK tablet, As directed x 6 days, Disp: 21 tablet, Rfl: 0  Assessment/ Plan: 79 y.o. female   1. Bronchitis - predniSONE (STERAPRED UNI-PAK 21 TAB) 10 MG (21) TBPK tablet; As directed x 6 days  Dispense: 21 tablet; Refill: 0  2. Anxiety - DRUG SCREEN-TOXASSURE - ALPRAZolam (XANAX) 0.25 MG tablet; Take 1 tablet (0.25 mg total) by mouth 2 (two) times daily.  Dispense: 60 tablet; Refill: 1  3. Hypothyroidism, unspecified type - Thyroid Panel With TSH  4. Chronic  gout without tophus, unspecified cause, unspecified site - Uric acid  5. Essential hypertension - CBC with Differential/Platelet - CMP14+EGFR - Lipid Panel   No follow-ups on file.  Continue all other maintenance medications as listed above.  Start time: 9:06 AM End time: 9:19 AM  Meds ordered this encounter  Medications  . predniSONE (STERAPRED UNI-PAK 21 TAB) 10 MG (21) TBPK tablet    Sig: As directed x 6 days    Dispense:  21 tablet    Refill:  0    Order Specific Question:   Supervising Provider    Answer:   Janora Norlander [[id number]]  . loratadine (CLARITIN) 10 MG tablet    Sig: Take 1 tablet (10 mg total) by mouth daily.    Dispense:  30 tablet    Refill:  11    Order Specific Question:   Supervising Provider    Answer:   Janora Norlander [[id number]]  . ALPRAZolam (XANAX) 0.25 MG tablet    Sig: Take 1 tablet (0.25 mg total) by mouth 2 (two) times daily.    Dispense:  60 tablet    Refill:  1    Order Specific Question:   Supervising Provider    Answer:   Janora Norlander [[id number]]    Particia Nearing PA-C [place] [phone]

## 2019-08-06 ENCOUNTER — Other Ambulatory Visit: Payer: Self-pay

## 2019-08-06 ENCOUNTER — Other Ambulatory Visit: Payer: Medicare Other

## 2019-08-06 DIAGNOSIS — E039 Hypothyroidism, unspecified: Secondary | ICD-10-CM | POA: Diagnosis not present

## 2019-08-06 DIAGNOSIS — Z79891 Long term (current) use of opiate analgesic: Secondary | ICD-10-CM | POA: Diagnosis not present

## 2019-08-06 DIAGNOSIS — I10 Essential (primary) hypertension: Secondary | ICD-10-CM | POA: Diagnosis not present

## 2019-08-06 DIAGNOSIS — M1A9XX0 Chronic gout, unspecified, without tophus (tophi): Secondary | ICD-10-CM | POA: Diagnosis not present

## 2019-08-07 ENCOUNTER — Encounter: Payer: Self-pay | Admitting: Physician Assistant

## 2019-08-07 LAB — CMP14+EGFR
ALT: 11 IU/L (ref 0–32)
AST: 16 IU/L (ref 0–40)
Albumin/Globulin Ratio: 1.9 (ref 1.2–2.2)
Albumin: 4.6 g/dL (ref 3.7–4.7)
Alkaline Phosphatase: 133 IU/L — ABNORMAL HIGH (ref 39–117)
BUN/Creatinine Ratio: 22 (ref 12–28)
BUN: 23 mg/dL (ref 8–27)
Bilirubin Total: 0.7 mg/dL (ref 0.0–1.2)
CO2: 22 mmol/L (ref 20–29)
Calcium: 9.2 mg/dL (ref 8.7–10.3)
Chloride: 105 mmol/L (ref 96–106)
Creatinine, Ser: 1.05 mg/dL — ABNORMAL HIGH (ref 0.57–1.00)
GFR calc Af Amer: 59 mL/min/{1.73_m2} — ABNORMAL LOW (ref >59)
GFR calc non Af Amer: 51 mL/min/{1.73_m2} — ABNORMAL LOW (ref >59)
Globulin, Total: 2.4 g/dL (ref 1.5–4.5)
Glucose: 92 mg/dL (ref 65–99)
Potassium: 4.1 mmol/L (ref 3.5–5.2)
Sodium: 144 mmol/L (ref 134–144)
Total Protein: 7 g/dL (ref 6.0–8.5)

## 2019-08-07 LAB — CBC WITH DIFFERENTIAL/PLATELET
Basophils Absolute: 0.1 10*3/uL (ref 0.0–0.2)
Basos: 1 %
EOS (ABSOLUTE): 0 10*3/uL (ref 0.0–0.4)
Eos: 0 %
Hematocrit: 35.4 % (ref 34.0–46.6)
Hemoglobin: 11.4 g/dL (ref 11.1–15.9)
Lymphocytes Absolute: 2.3 10*3/uL (ref 0.7–3.1)
Lymphs: 27 %
MCH: 30.5 pg (ref 26.6–33.0)
MCHC: 32.2 g/dL (ref 31.5–35.7)
MCV: 95 fL (ref 79–97)
Monocytes Absolute: 0.2 10*3/uL (ref 0.1–0.9)
Monocytes: 2 %
Neutrophils Absolute: 5.9 10*3/uL (ref 1.4–7.0)
Neutrophils: 70 %
Platelets: 241 10*3/uL (ref 150–450)
RBC: 3.74 x10E6/uL — ABNORMAL LOW (ref 3.77–5.28)
RDW: 13.1 % (ref 11.7–15.4)
WBC: 8.4 10*3/uL (ref 3.4–10.8)

## 2019-08-07 LAB — THYROID PANEL WITH TSH
Free Thyroxine Index: 1.8 (ref 1.2–4.9)
T3 Uptake Ratio: 24 % (ref 24–39)
T4, Total: 7.7 ug/dL (ref 4.5–12.0)
TSH: 0.885 u[IU]/mL (ref 0.450–4.500)

## 2019-08-07 LAB — LIPID PANEL
Chol/HDL Ratio: 2 ratio (ref 0.0–4.4)
Cholesterol, Total: 178 mg/dL (ref 100–199)
HDL: 87 mg/dL (ref >39)
LDL Chol Calc (NIH): 80 mg/dL (ref 0–99)
Triglycerides: 54 mg/dL (ref 0–149)
VLDL Cholesterol Cal: 11 mg/dL (ref 5–40)

## 2019-08-07 LAB — URIC ACID: Uric Acid: 6.1 mg/dL (ref 3.1–7.9)

## 2019-08-10 LAB — TOXASSURE SELECT 13 (MW), URINE

## 2019-08-14 ENCOUNTER — Encounter: Payer: Self-pay | Admitting: Obstetrics & Gynecology

## 2019-08-14 ENCOUNTER — Ambulatory Visit: Payer: Medicare Other | Admitting: Obstetrics & Gynecology

## 2019-08-14 ENCOUNTER — Other Ambulatory Visit: Payer: Self-pay

## 2019-08-14 VITALS — BP 147/76 | HR 82 | Ht 62.2 in | Wt 144.0 lb

## 2019-08-14 DIAGNOSIS — Z4689 Encounter for fitting and adjustment of other specified devices: Secondary | ICD-10-CM

## 2019-08-14 DIAGNOSIS — N813 Complete uterovaginal prolapse: Secondary | ICD-10-CM | POA: Diagnosis not present

## 2019-08-14 NOTE — Progress Notes (Signed)
Chief Complaint  Patient presents with  . Pessary Check    Blood pressure (!) 147/76, pulse 82, height 5' 2.2" (1.58 m), weight 144 lb (65.3 kg).  Christie Bruce presents today for routine follow up related to her pessary.   She uses a Milex ring with support #2 She reports no vaginal discharge or vaginal bleeding.  Exam reveals no undue vaginal mucosal pressure of breakdown, no discharge and no vaginal bleeding.  The pessary is removed, cleaned and replaced without difficulty.    Christie Bruce will be sen back in 4 months for continued follow up.  Florian Buff, MD  08/14/2019 11:45 AM

## 2019-08-21 ENCOUNTER — Other Ambulatory Visit: Payer: Self-pay | Admitting: Physician Assistant

## 2019-08-21 DIAGNOSIS — M7989 Other specified soft tissue disorders: Secondary | ICD-10-CM

## 2019-09-16 ENCOUNTER — Other Ambulatory Visit: Payer: Self-pay | Admitting: Physician Assistant

## 2019-09-16 DIAGNOSIS — F419 Anxiety disorder, unspecified: Secondary | ICD-10-CM

## 2019-10-13 ENCOUNTER — Telehealth: Payer: Self-pay | Admitting: Physician Assistant

## 2019-10-13 NOTE — Chronic Care Management (AMB) (Signed)
  Chronic Care Management   Outreach Note  10/13/2019 Name: Christie Bruce MRN: [id number] DOB: Jan 30, 1941  Christie Bruce is a 79 y.o. year old female who is a primary care patient of Terald Sleeper, PA-C. I reached out to Christie Bruce by phone today in response to a referral sent by [name] health plan.     An unsuccessful telephone outreach was attempted today. The patient was referred to the case management team for assistance with care management and care coordination.   Follow Up Plan: A HIPPA compliant phone message was left for the patient providing contact information and requesting a return call.  The care management team will reach out to the patient again over the next 7 days.  If patient returns call to provider office, please advise to call [facility]  at [place], [place], [place], [place] [address] Direct Dial: [phone] [email or web address] Website: [email or web address]

## 2019-10-16 NOTE — Chronic Care Management (AMB) (Signed)
Chronic Care Management   Note  10/16/2019 Name: [name] MRN: [id number] DOB: 1940/11/07  Christie Bruce is a 79 y.o. year old female who is a primary care patient of Terald Sleeper, PA-C. I reached out to Christie Bruce by phone today in response to a referral sent by Ms. Stefani L Lungren's health plan.     Ms. Apodaca was given information about Chronic Care Management services today including:  1. CCM service includes personalized support from designated clinical staff supervised by her physician, including individualized plan of care and coordination with other care providers 2. 24/7 contact phone numbers for assistance for urgent and routine care needs. 3. Service will only be billed when office clinical staff spend 20 minutes or more in a month to coordinate care. 4. Only one practitioner may furnish and bill the service in a calendar month. 5. The patient may stop CCM services at any time (effective at the end of the month) by phone call to the office staff. 6. The patient will be responsible for cost sharing (co-pay) of up to 20% of the service fee (after annual deductible is met).  Patient did not agree to enrollment in care management services and does not wish to consider at this time.  Follow up plan: The patient has been provided with contact information for the care management team and has been advised to call with any health related questions or concerns.   Noreene Larsson, [place], [place], [place] [address] Direct Dial: [phone] [email or web address] Website: [email or web address]

## 2019-10-20 ENCOUNTER — Telehealth: Payer: Self-pay

## 2019-10-20 MED ORDER — LEVOTHYROXINE SODIUM 75 MCG PO TABS: ORAL_TABLET | ORAL | 0 refills | Status: DC

## 2019-10-20 NOTE — Telephone Encounter (Signed)
Refill Levothyroxine

## 2019-11-04 ENCOUNTER — Other Ambulatory Visit: Payer: Self-pay | Admitting: *Deleted

## 2019-11-04 DIAGNOSIS — F419 Anxiety disorder, unspecified: Secondary | ICD-10-CM

## 2019-11-04 DIAGNOSIS — I10 Essential (primary) hypertension: Secondary | ICD-10-CM

## 2019-11-04 MED ORDER — AMLODIPINE BESYLATE 5 MG PO TABS: ORAL_TABLET | ORAL | 0 refills | Status: DC

## 2019-11-30 ENCOUNTER — Other Ambulatory Visit: Payer: Self-pay | Admitting: *Deleted

## 2019-11-30 DIAGNOSIS — K219 Gastro-esophageal reflux disease without esophagitis: Secondary | ICD-10-CM

## 2019-11-30 MED ORDER — OMEPRAZOLE 40 MG PO CPDR: DELAYED_RELEASE_CAPSULE | ORAL | 0 refills | Status: DC

## 2019-12-01 ENCOUNTER — Encounter: Payer: Self-pay | Admitting: Family Medicine

## 2019-12-01 ENCOUNTER — Ambulatory Visit (INDEPENDENT_AMBULATORY_CARE_PROVIDER_SITE_OTHER): Payer: Medicare Other | Admitting: Family Medicine

## 2019-12-01 DIAGNOSIS — Z79899 Other long term (current) drug therapy: Secondary | ICD-10-CM | POA: Diagnosis not present

## 2019-12-01 MED ORDER — AMOXICILLIN-POT CLAVULANATE 875-125 MG PO TABS
1.0000 | ORAL_TABLET | Freq: Two times a day (BID) | ORAL | 0 refills | Status: DC
Start: 2019-12-01 — End: 2019-12-29

## 2019-12-01 MED ORDER — FEXOFENADINE-PSEUDOEPHED ER 180-240 MG PO TB24: 1.0000 | ORAL_TABLET | Freq: Every evening | ORAL | 11 refills | Status: DC

## 2019-12-01 NOTE — Progress Notes (Signed)
Subjective:    Patient ID: Christie Bruce, female    DOB: 1940-10-01, 79 y.o.   MRN: [id number]   HPI: Christie Bruce is a 79 y.o. female presenting for Symptoms include congestion, facial pain, nasal congestion, occasional cough, post nasal drip and sinus pressure. Head feels heavy. There is no fever, chills, or sweats. Onset of symptoms was a week and a half ago, gradually worsening since that time. Has allergy symptoms.     Depression screen [facility] 2/9 10/03/2018 07/21/2018 07/02/2018 06/02/2018 05/19/2018  Decreased Interest 0 0 0 0 0  Down, Depressed, Hopeless 0 0 0 0 0  PHQ - 2 Score 0 0 0 0 0  Altered sleeping - - - - -  Tired, decreased energy - - - - -  Change in appetite - - - - -  Feeling bad or failure about yourself  - - - - -  Trouble concentrating - - - - -  Moving slowly or fidgety/restless - - - - -  Suicidal thoughts - - - - -  PHQ-9 Score - - - - -  Difficult doing work/chores - - - - -     Relevant past medical, surgical, family and social history reviewed and updated as indicated.  Interim medical history since our last visit reviewed. Allergies and medications reviewed and updated.  ROS:  Review of Systems  Constitutional: Negative for activity change, appetite change, chills and fever.  HENT: Positive for congestion, postnasal drip, rhinorrhea and sinus pressure. Negative for ear discharge, ear pain, hearing loss, nosebleeds, sneezing and trouble swallowing.   Respiratory: Negative for chest tightness and shortness of breath.   Cardiovascular: Negative for chest pain and palpitations.  Skin: Negative for rash.     Social History   Tobacco Use  Smoking Status Former Smoker  . Years: 15.00  . Types: Cigarettes  Smokeless Tobacco Never Used       Objective:     Wt Readings from Last 3 Encounters:  08/14/19 144 lb (65.3 kg)  04/02/19 140 lb (63.5 kg)  11/13/18 142 lb (64.4 kg)     Exam deferred. Pt. [place] due to COVID 19. Phone visit  performed.   Assessment & Plan:   1. Controlled substance agreement signed     Meds ordered this encounter  Medications  . amoxicillin-clavulanate (AUGMENTIN) 875-125 MG tablet    Sig: Take 1 tablet by mouth 2 (two) times daily. Take all of this medication    Dispense:  20 tablet    Refill:  0  . fexofenadine-pseudoephedrine (ALLEGRA-D 24) 180-240 MG 24 hr tablet    Sig: Take 1 tablet by mouth every evening. For allergy and congestion    Dispense:  30 tablet    Refill:  11    No orders of the defined types were placed in this encounter.     Diagnoses and all orders for this visit:  Controlled substance agreement signed  Other orders -     amoxicillin-clavulanate (AUGMENTIN) 875-125 MG tablet; Take 1 tablet by mouth 2 (two) times daily. Take all of this medication -     fexofenadine-pseudoephedrine (ALLEGRA-D 24) 180-240 MG 24 hr tablet; Take 1 tablet by mouth every evening. For allergy and congestion    Virtual Visit via telephone Note  I discussed the limitations, risks, security and privacy concerns of performing an evaluation and management service by telephone and the availability of in person appointments. The patient was identified with two identifiers. Pt.expressed understanding  and agreed to proceed. Pt. Is at home. Dr. Livia Snellen is in his office.  Follow Up Instructions:   I discussed the assessment and treatment plan with the patient. The patient was provided an opportunity to ask questions and all were answered. The patient agreed with the plan and demonstrated an understanding of the instructions.   The patient was advised to call back or seek an in-person evaluation if the symptoms worsen or if the condition fails to improve as anticipated.   Total minutes including chart review and phone contact time: 8   Follow up plan: Return if symptoms worsen or fail to improve.  Claretta Fraise, MD [place]

## 2019-12-02 ENCOUNTER — Other Ambulatory Visit: Payer: Self-pay | Admitting: *Deleted

## 2019-12-02 DIAGNOSIS — F419 Anxiety disorder, unspecified: Secondary | ICD-10-CM

## 2019-12-02 MED ORDER — ALPRAZOLAM 0.25 MG PO TABS: 0.2500 mg | ORAL_TABLET | Freq: Two times a day (BID) | ORAL | 1 refills | Status: DC

## 2019-12-11 ENCOUNTER — Ambulatory Visit: Payer: Medicare Other | Admitting: Obstetrics & Gynecology

## 2019-12-18 ENCOUNTER — Ambulatory Visit: Payer: Medicare Other | Admitting: Obstetrics & Gynecology

## 2019-12-29 ENCOUNTER — Encounter: Payer: Self-pay | Admitting: Obstetrics & Gynecology

## 2019-12-29 ENCOUNTER — Ambulatory Visit: Payer: Medicare Other | Admitting: Obstetrics & Gynecology

## 2019-12-29 DIAGNOSIS — Z466 Encounter for fitting and adjustment of urinary device: Secondary | ICD-10-CM | POA: Diagnosis not present

## 2019-12-29 DIAGNOSIS — N813 Complete uterovaginal prolapse: Secondary | ICD-10-CM

## 2019-12-29 DIAGNOSIS — Z4689 Encounter for fitting and adjustment of other specified devices: Secondary | ICD-10-CM

## 2019-12-29 NOTE — Progress Notes (Signed)
Chief Complaint  Patient presents with  . Pessary Check    There were no vitals taken for this visit.  Christie Bruce presents today for routine follow up related to her pessary.   She uses a Milex ring with support #2 She reports no vaginal discharge or vaginal bleeding.  Exam reveals no undue vaginal mucosal pressure of breakdown, no discharge and no vaginal bleeding.  The pessary is removed, cleaned and replaced without difficulty.    Christie Bruce will be sen back in 4 months for continued follow up.  Florian Buff, MD  12/29/2019 10:19 AM

## 2020-01-26 ENCOUNTER — Other Ambulatory Visit: Payer: Self-pay | Admitting: Family Medicine

## 2020-01-26 DIAGNOSIS — F419 Anxiety disorder, unspecified: Secondary | ICD-10-CM

## 2020-02-15 ENCOUNTER — Other Ambulatory Visit: Payer: Self-pay | Admitting: Family Medicine

## 2020-02-15 DIAGNOSIS — I10 Essential (primary) hypertension: Secondary | ICD-10-CM

## 2020-02-16 ENCOUNTER — Other Ambulatory Visit: Payer: Self-pay

## 2020-02-16 DIAGNOSIS — M7989 Other specified soft tissue disorders: Secondary | ICD-10-CM

## 2020-02-16 MED ORDER — COLCHICINE 0.6 MG PO TABS: ORAL_TABLET | ORAL | 2 refills | Status: DC

## 2020-03-14 ENCOUNTER — Other Ambulatory Visit: Payer: Self-pay | Admitting: Family Medicine

## 2020-03-14 DIAGNOSIS — F419 Anxiety disorder, unspecified: Secondary | ICD-10-CM

## 2020-04-01 ENCOUNTER — Ambulatory Visit: Payer: Medicare Other | Admitting: Family Medicine

## 2020-04-13 ENCOUNTER — Other Ambulatory Visit: Payer: Self-pay

## 2020-04-13 ENCOUNTER — Ambulatory Visit (INDEPENDENT_AMBULATORY_CARE_PROVIDER_SITE_OTHER): Payer: Medicare Other | Admitting: Family Medicine

## 2020-04-13 ENCOUNTER — Encounter: Payer: Self-pay | Admitting: Family Medicine

## 2020-04-13 VITALS — BP 138/86 | HR 74 | Temp 98.1°F | Resp 20 | Ht 62.0 in | Wt 142.0 lb

## 2020-04-13 DIAGNOSIS — F411 Generalized anxiety disorder: Secondary | ICD-10-CM

## 2020-04-13 DIAGNOSIS — F419 Anxiety disorder, unspecified: Secondary | ICD-10-CM

## 2020-04-13 DIAGNOSIS — M1A9XX0 Chronic gout, unspecified, without tophus (tophi): Secondary | ICD-10-CM

## 2020-04-13 DIAGNOSIS — I10 Essential (primary) hypertension: Secondary | ICD-10-CM | POA: Diagnosis not present

## 2020-04-13 DIAGNOSIS — N1832 Chronic kidney disease, stage 3b: Secondary | ICD-10-CM

## 2020-04-13 DIAGNOSIS — E039 Hypothyroidism, unspecified: Secondary | ICD-10-CM

## 2020-04-13 MED ORDER — ALPRAZOLAM 0.25 MG PO TABS: 0.2500 mg | ORAL_TABLET | Freq: Two times a day (BID) | ORAL | 5 refills | Status: DC

## 2020-04-13 NOTE — Progress Notes (Signed)
Subjective:  Patient ID: Christie Bruce, female    DOB: 09-12-1940  Age: 79 y.o. MRN: [id number]  CC: Medical Management of Chronic Issues   HPI Christie Bruce presents for  presents for  follow-up of hypertension. Patient has no history of headache chest pain or shortness of breath or recent cough. Patient also denies symptoms of TIA such as focal numbness or weakness. Patient denies side effects from medication. States taking it regularly.  follow-up on  thyroid. The patient has a history of hypothyroidism for many years. It has been stable recently. Pt. denies any change in  voice, loss of hair, heat or cold intolerance. Energy level has been adequate to good. Patient denies constipation and diarrhea. No myxedema. Medication is as noted below. Verified that pt is taking it daily on an empty stomach. Well tolerated. Christie Bruce states that she is having a lot of reflux symptoms.  Lot of water coming up in her throat throat.  There is also a lot of burning in the epigastric region going up into the substernal region.  Ongoing for several weeks.   Depression screen [facility] 2/9 04/13/2020 10/03/2018 07/21/2018  Decreased Interest 0 0 0  Down, Depressed, Hopeless 0 0 0  PHQ - 2 Score 0 0 0  Altered sleeping - - -  Tired, decreased energy - - -  Change in appetite - - -  Feeling bad or failure about yourself  - - -  Trouble concentrating - - -  Moving slowly or fidgety/restless - - -  Suicidal thoughts - - -  PHQ-9 Score - - -  Difficult doing work/chores - - -    History Christie Bruce has a past medical history of Asthma, Breast cancer ([place]), Chronic kidney disease, DCIS (ductal carcinoma in situ) of breast (09/12/2011), GERD (gastroesophageal reflux disease) (09/12/2011), Gout, Heart murmur, Hypertension, PONV (postoperative nausea and vomiting), Thyroid disease, and Uterine prolapse.   She has a past surgical history that includes bilateral mastectomy (07/19/10); Knee surgery (Left); Wrist surgery (Left);  Intramedullary (im) nail intertrochanteric (Left, 12/28/2015); Cholecystectomy (N/A, 02/13/2016); and Hip surgery.   Her family history includes Cancer in her brother, father, mother, and paternal uncle; Diabetes in her brother and brother; Heart attack in her brother; Heart disease in her father; Hypertension in her father and sister; Stroke in her mother.She reports that she has quit smoking. Her smoking use included cigarettes. She quit after 15.00 years of use. She has never used smokeless tobacco. She reports that she does not drink alcohol and does not use drugs.    ROS Review of Systems  Constitutional: Negative.   HENT: Negative.   Eyes: Negative for visual disturbance.  Respiratory: Negative for shortness of breath.   Cardiovascular: Negative for chest pain.  Gastrointestinal: Negative for abdominal pain.  Musculoskeletal: Negative for arthralgias.    Objective:  BP 138/86   Pulse 74   Temp 98.1 F (36.7 C) (Temporal)   Resp 20   Ht _0  (1.575 m)   Wt 142 lb (64.4 kg)   SpO2 98%   BMI 25.97 kg/m   BP Readings from Last 3 Encounters:  04/13/20 138/86  08/14/19 (!) 147/76  04/02/19 (!) 157/78    Wt Readings from Last 3 Encounters:  04/13/20 142 lb (64.4 kg)  08/14/19 144 lb (65.3 kg)  04/02/19 140 lb (63.5 kg)     Physical Exam Constitutional:      Appearance: She is well-developed.  HENT:     Head:  Normocephalic and atraumatic.  Cardiovascular:     Rate and Rhythm: Normal rate and regular rhythm.     Heart sounds: No murmur heard.   Pulmonary:     Effort: Pulmonary effort is normal.     Breath sounds: Normal breath sounds.  Abdominal:     General: Bowel sounds are normal.     Palpations: Abdomen is soft. There is no mass.     Tenderness: There is abdominal tenderness (epigastric). There is no guarding or rebound.  Skin:    General: Skin is warm and dry.  Neurological:     Mental Status: She is alert and oriented to person, place, and time.    Psychiatric:        Behavior: Behavior normal.       Assessment & Plan:   Christie Bruce was seen today for medical management of chronic issues.  Diagnoses and all orders for this visit:  Hypothyroidism, unspecified type -     CBC with Differential/Platelet -     CMP14+EGFR -     Lipid panel -     TSH + free T4  Chronic gout without tophus, unspecified cause, unspecified site -     CBC with Differential/Platelet -     CMP14+EGFR -     Uric acid  Stage 3b chronic kidney disease -     CBC with Differential/Platelet -     CMP14+EGFR  Essential hypertension -     CBC with Differential/Platelet -     CMP14+EGFR -     Lipid panel  GAD (generalized anxiety disorder) -     CBC with Differential/Platelet -     CMP14+EGFR  Anxiety -     ALPRAZolam (XANAX) 0.25 MG tablet; Take 1 tablet (0.25 mg total) by mouth 2 (two) times daily.       I am having Christie Bruce maintain her [employer], ipratropium-albuterol, albuterol, omeprazole, levothyroxine, amLODipine, colchicine, and ALPRAZolam.  Allergies as of 04/13/2020      Reactions   Celebrex [celecoxib]    Hydrochlorothiazide    Lisinopril Swelling   Sulfa Antibiotics    Had sores in my mouth, bottom of my feet "the skin just came right off"      Medication List       Accurate as of April 13, 2020  1:48 PM. If you have any questions, ask your nurse or doctor.        albuterol 108 (90 Base) MCG/ACT inhaler Commonly known as: VENTOLIN HFA Inhale 2 puffs into the lungs every 6 (six) hours as needed for wheezing or shortness of breath.   ALPRAZolam 0.25 MG tablet Commonly known as: XANAX Take 1 tablet (0.25 mg total) by mouth 2 (two) times daily.   amLODipine 5 MG tablet Commonly known as: NORVASC TAKE ONE (1) TABLET EACH DAY   colchicine 0.6 MG tablet Take 1 tablet now then repeat in 2 hours if needed (max of 2 tablets daily)   ipratropium-albuterol 0.5-2.5 (3) MG/3ML Soln Commonly known  as: DUONEB Take 3 mLs by nebulization every 6 (six) hours as needed.   levothyroxine 75 MCG tablet Commonly known as: SYNTHROID TAKE ONE (1) TABLET EACH DAY   omeprazole 40 MG capsule Commonly known as: PRILOSEC TAKE ONE (1) CAPSULE EACH DAY   Spacer/Aero Chamber Mouthpiece Misc 1 Units 4 (four) times daily by Does not apply route.        Follow-up: No follow-ups on file.  Claretta Fraise, M.D.

## 2020-04-14 LAB — CMP14+EGFR
ALT: 11 IU/L (ref 0–32)
AST: 22 IU/L (ref 0–40)
Albumin/Globulin Ratio: 2.1 (ref 1.2–2.2)
Albumin: 4.6 g/dL (ref 3.7–4.7)
Alkaline Phosphatase: 119 IU/L (ref 44–121)
BUN/Creatinine Ratio: 13 (ref 12–28)
BUN: 14 mg/dL (ref 8–27)
Bilirubin Total: 1.1 mg/dL (ref 0.0–1.2)
CO2: 21 mmol/L (ref 20–29)
Calcium: 9.1 mg/dL (ref 8.7–10.3)
Chloride: 103 mmol/L (ref 96–106)
Creatinine, Ser: 1.05 mg/dL — ABNORMAL HIGH (ref 0.57–1.00)
GFR calc Af Amer: 58 mL/min/{1.73_m2} — ABNORMAL LOW (ref >59)
GFR calc non Af Amer: 51 mL/min/{1.73_m2} — ABNORMAL LOW (ref >59)
Globulin, Total: 2.2 g/dL (ref 1.5–4.5)
Glucose: 79 mg/dL (ref 65–99)
Potassium: 4.1 mmol/L (ref 3.5–5.2)
Sodium: 140 mmol/L (ref 134–144)
Total Protein: 6.8 g/dL (ref 6.0–8.5)

## 2020-04-14 LAB — LIPID PANEL
Chol/HDL Ratio: 2.6 ratio (ref 0.0–4.4)
Cholesterol, Total: 185 mg/dL (ref 100–199)
HDL: 70 mg/dL (ref >39)
LDL Chol Calc (NIH): 103 mg/dL — ABNORMAL HIGH (ref 0–99)
Triglycerides: 64 mg/dL (ref 0–149)
VLDL Cholesterol Cal: 12 mg/dL (ref 5–40)

## 2020-04-14 LAB — CBC WITH DIFFERENTIAL/PLATELET
Basophils Absolute: 0 10*3/uL (ref 0.0–0.2)
Basos: 1 %
EOS (ABSOLUTE): 0.2 10*3/uL (ref 0.0–0.4)
Eos: 4 %
Hematocrit: 36.4 % (ref 34.0–46.6)
Hemoglobin: 11.8 g/dL (ref 11.1–15.9)
Immature Grans (Abs): 0 10*3/uL (ref 0.0–0.1)
Immature Granulocytes: 0 %
Lymphocytes Absolute: 2.1 10*3/uL (ref 0.7–3.1)
Lymphs: 34 %
MCH: 29.3 pg (ref 26.6–33.0)
MCHC: 32.4 g/dL (ref 31.5–35.7)
MCV: 90 fL (ref 79–97)
Monocytes Absolute: 0.5 10*3/uL (ref 0.1–0.9)
Monocytes: 8 %
Neutrophils Absolute: 3.3 10*3/uL (ref 1.4–7.0)
Neutrophils: 53 %
Platelets: 235 10*3/uL (ref 150–450)
RBC: 4.03 x10E6/uL (ref 3.77–5.28)
RDW: 13 % (ref 11.7–15.4)
WBC: 6 10*3/uL (ref 3.4–10.8)

## 2020-04-14 LAB — TSH+FREE T4
Free T4: 1.14 ng/dL (ref 0.82–1.77)
TSH: 4.22 u[IU]/mL (ref 0.450–4.500)

## 2020-04-14 LAB — URIC ACID: Uric Acid: 6.4 mg/dL (ref 3.1–7.9)

## 2020-04-14 NOTE — Progress Notes (Signed)
Hello Christie Bruce,  Your lab result is normal and/or stable.Some minor variations that are not significant are commonly marked abnormal, but do not represent any medical problem for you.  Best regards, Dominique Ressel, M.D.

## 2020-04-19 ENCOUNTER — Other Ambulatory Visit: Payer: Self-pay | Admitting: Family

## 2020-04-19 DIAGNOSIS — K219 Gastro-esophageal reflux disease without esophagitis: Secondary | ICD-10-CM

## 2020-04-29 ENCOUNTER — Other Ambulatory Visit: Payer: Self-pay

## 2020-04-29 ENCOUNTER — Ambulatory Visit: Payer: Medicare Other | Admitting: Obstetrics & Gynecology

## 2020-04-29 ENCOUNTER — Encounter: Payer: Self-pay | Admitting: Obstetrics & Gynecology

## 2020-04-29 VITALS — BP 151/76 | HR 82 | Ht 62.0 in | Wt 142.5 lb

## 2020-04-29 DIAGNOSIS — Z4689 Encounter for fitting and adjustment of other specified devices: Secondary | ICD-10-CM

## 2020-04-29 DIAGNOSIS — N813 Complete uterovaginal prolapse: Secondary | ICD-10-CM

## 2020-04-29 NOTE — Progress Notes (Signed)
Chief Complaint  Patient presents with  . Pessary Check    Blood pressure (!) 151/76, pulse 82, height 5\' 2"  (1.575 m), weight 142 lb 8 oz (64.6 kg).  Christie Bruce presents today for routine follow up related to her pessary.   She uses a #2 She reports no vaginal discharge or vaginal bleeding.  Exam reveals no undue vaginal mucosal pressure of breakdown, no discharge and no vaginal bleeding.  The pessary is removed, cleaned and replaced without difficulty.    Christie Bruce will be sen back in 4 months for continued follow up.  Florian Buff, MD  04/29/2020 9:35 AM

## 2020-06-13 ENCOUNTER — Other Ambulatory Visit: Payer: Self-pay | Admitting: Family Medicine

## 2020-06-13 DIAGNOSIS — I10 Essential (primary) hypertension: Secondary | ICD-10-CM

## 2020-06-13 DIAGNOSIS — M7989 Other specified soft tissue disorders: Secondary | ICD-10-CM

## 2020-06-18 ENCOUNTER — Other Ambulatory Visit: Payer: Self-pay | Admitting: Family Medicine

## 2020-07-30 ENCOUNTER — Other Ambulatory Visit: Payer: Self-pay | Admitting: Family Medicine

## 2020-07-30 DIAGNOSIS — K219 Gastro-esophageal reflux disease without esophagitis: Secondary | ICD-10-CM

## 2020-08-30 ENCOUNTER — Other Ambulatory Visit: Payer: Self-pay | Admitting: Family Medicine

## 2020-08-30 DIAGNOSIS — J45909 Unspecified asthma, uncomplicated: Secondary | ICD-10-CM

## 2020-09-19 ENCOUNTER — Other Ambulatory Visit: Payer: Self-pay | Admitting: Family Medicine

## 2020-09-19 DIAGNOSIS — M7989 Other specified soft tissue disorders: Secondary | ICD-10-CM

## 2020-09-27 ENCOUNTER — Ambulatory Visit: Payer: Medicare Other | Admitting: Obstetrics & Gynecology

## 2020-09-27 ENCOUNTER — Other Ambulatory Visit: Payer: Self-pay

## 2020-09-27 ENCOUNTER — Encounter: Payer: Self-pay | Admitting: Obstetrics & Gynecology

## 2020-09-27 VITALS — BP 157/80 | HR 87 | Wt 142.0 lb

## 2020-09-27 DIAGNOSIS — N813 Complete uterovaginal prolapse: Secondary | ICD-10-CM

## 2020-09-27 DIAGNOSIS — Z4689 Encounter for fitting and adjustment of other specified devices: Secondary | ICD-10-CM

## 2020-09-27 NOTE — Progress Notes (Signed)
Chief Complaint  Patient presents with  . Pessary Check    Blood pressure (!) 157/80, pulse 87, weight 142 lb (64.4 kg).  Christie Bruce presents today for routine follow up related to her pessary.   She uses a Milex ring with support #2 She reports no vaginal discharge and no vaginal bleeding   Likert scale(1 not bothersome -5 very bothersome)  :  1  Exam reveals no undue vaginal mucosal pressure of breakdown, no discharge and no vaginal bleeding.  Vaginal Epithelial Abnormality Classification System:   0 0    No abnormalities 1    Epithelial erythema 2    Granulation tissue 3    Epithelial break or erosion, 1 cm or less 4    Epithelial break or erosion, 1 cm or greater  The pessary is removed, cleaned and replaced without difficulty.      ICD-10-CM   1. Pessary maintenance, Milex ring with support #2  Z46.89    Removed/cleaned/replaced today  2. Uterovaginal prolapse, complete  N81.3    Stable chronic problem: well managed by pessary Clinical plan: Continue with pessary     Christie Bruce will be sen back in 4 months for continued follow up.  Florian Buff, MD  09/27/2020 12:24 PM

## 2020-09-29 ENCOUNTER — Encounter: Payer: Self-pay | Admitting: Nurse Practitioner

## 2020-09-29 ENCOUNTER — Ambulatory Visit (INDEPENDENT_AMBULATORY_CARE_PROVIDER_SITE_OTHER): Payer: Medicare Other | Admitting: Nurse Practitioner

## 2020-09-29 ENCOUNTER — Other Ambulatory Visit: Payer: Self-pay

## 2020-09-29 VITALS — BP 158/76 | HR 80 | Temp 97.7°F | Resp 20 | Ht 62.0 in | Wt 140.0 lb

## 2020-09-29 DIAGNOSIS — J301 Allergic rhinitis due to pollen: Secondary | ICD-10-CM

## 2020-09-29 MED ORDER — FEXOFENADINE HCL 180 MG PO TABS: 180.0000 mg | ORAL_TABLET | Freq: Every day | ORAL | 3 refills | Status: DC

## 2020-09-29 MED ORDER — FLUTICASONE PROPIONATE 50 MCG/ACT NA SUSP: 2.0000 | Freq: Every day | NASAL | 6 refills | Status: DC

## 2020-09-29 NOTE — Progress Notes (Signed)
   Subjective:    Patient ID: Christie Bruce, female    DOB: 26-Sep-1940, 80 y.o.   MRN: [id number]   Chief Complaint: Sinus Problem (Negative Covid test yesterday/) and Dizziness   HPI Pt presents today with complaints of dizziness, sinus pressure, post nasal drainage, and full ears. This began about one month ago. It happens on and off during the day. Symptoms resolve when she feels her sinuses drain down to her throat. Symptoms worsen when she her sinuses feel full. No cough. She was taking OTC Allegra-D and her symptoms felt better when she was taking it. She has not taken it in the last week.    Review of Systems  Constitutional: Negative for activity change, fatigue and fever.  HENT: Positive for congestion, postnasal drip, sinus pressure and sneezing.   Eyes: Negative for pain, discharge and itching.  Respiratory: Positive for wheezing. Negative for cough and shortness of breath.   Cardiovascular: Negative for chest pain and palpitations.  Neurological: Positive for dizziness and light-headedness.       Objective:   Physical Exam HENT:     Right Ear: Tympanic membrane normal.     Left Ear: Tympanic membrane normal.     Nose: Rhinorrhea present. Rhinorrhea is clear.     Right Turbinates: Swollen and pale.     Left Turbinates: Swollen and pale.     Right Sinus: Maxillary sinus tenderness and frontal sinus tenderness present.     Left Sinus: Maxillary sinus tenderness and frontal sinus tenderness present.     Mouth/Throat:     Mouth: Mucous membranes are moist.     Dentition: Has dentures.     Pharynx: Uvula midline.     Comments: Pharynx cobblestone appearance Cardiovascular:     Rate and Rhythm: Normal rate and regular rhythm.  Pulmonary:     Effort: Pulmonary effort is normal.     Breath sounds: Normal breath sounds.  Musculoskeletal:     Cervical back: Normal range of motion and neck supple.  Lymphadenopathy:     Cervical: No cervical adenopathy.  Skin:    General:  Skin is warm and dry.  Neurological:     Mental Status: She is alert and oriented to person, place, and time.  Psychiatric:        Mood and Affect: Mood normal.        Behavior: Behavior normal.    Vitals:   09/29/20 1546  BP: (!) 158/76  Pulse: 80  Resp: 20  Temp: 97.7 F (36.5 C)  SpO2: 96%       Assessment & Plan:   Christie Bruce comes in today with chief complaint of Sinus Problem (Negative Covid test yesterday/) and Dizziness   Diagnosis and orders addressed:  1. Seasonal allergic rhinitis due to pollen Begin fluticasone. One spray per nostril every morning. It may take a few days for symptom relief. May need bid if symptoms do not improve in a few days. Take allegra, one tablet, once daily. Avoid any OTC allergy and cold medications that contain decongestants. Avoid benedryl.   - fluticasone (FLONASE) 50 MCG/ACT nasal spray; Place 2 sprays into both nostrils daily.  Dispense: 16 g; Refill: 6 - fexofenadine (ALLEGRA ALLERGY) 180 MG tablet; Take 1 tablet (180 mg total) by mouth daily.  Dispense: 30 tablet; Refill: 3   Labs pending Health Maintenance reviewed Diet and exercise encouraged  Follow up plan: PRN  Dollene Primrose, RN, BSN, FNP-Student  Mary-Margaret Hassell Done, FNP

## 2020-09-29 NOTE — Patient Instructions (Signed)

## 2020-10-24 ENCOUNTER — Other Ambulatory Visit: Payer: Self-pay | Admitting: Family Medicine

## 2020-10-24 DIAGNOSIS — M7989 Other specified soft tissue disorders: Secondary | ICD-10-CM

## 2020-11-05 ENCOUNTER — Other Ambulatory Visit: Payer: Self-pay | Admitting: Family Medicine

## 2020-11-05 DIAGNOSIS — K219 Gastro-esophageal reflux disease without esophagitis: Secondary | ICD-10-CM

## 2020-11-22 ENCOUNTER — Ambulatory Visit (INDEPENDENT_AMBULATORY_CARE_PROVIDER_SITE_OTHER): Payer: Medicare Other | Admitting: Family Medicine

## 2020-11-22 ENCOUNTER — Encounter: Payer: Self-pay | Admitting: Family Medicine

## 2020-11-22 ENCOUNTER — Other Ambulatory Visit: Payer: Self-pay

## 2020-11-22 VITALS — BP 160/76 | HR 91 | Temp 98.3°F | Ht 62.0 in | Wt 143.8 lb

## 2020-11-22 DIAGNOSIS — F419 Anxiety disorder, unspecified: Secondary | ICD-10-CM | POA: Diagnosis not present

## 2020-11-22 DIAGNOSIS — E039 Hypothyroidism, unspecified: Secondary | ICD-10-CM | POA: Diagnosis not present

## 2020-11-22 DIAGNOSIS — N1832 Chronic kidney disease, stage 3b: Secondary | ICD-10-CM | POA: Diagnosis not present

## 2020-11-22 DIAGNOSIS — I10 Essential (primary) hypertension: Secondary | ICD-10-CM | POA: Diagnosis not present

## 2020-11-22 MED ORDER — SPIRONOLACTONE 25 MG PO TABS: 25.0000 mg | ORAL_TABLET | Freq: Every day | ORAL | 3 refills | Status: DC

## 2020-11-22 MED ORDER — ALPRAZOLAM 0.25 MG PO TABS: 0.2500 mg | ORAL_TABLET | Freq: Two times a day (BID) | ORAL | 5 refills | Status: DC

## 2020-11-22 NOTE — Progress Notes (Signed)
Subjective:  Patient ID: Christie Bruce, female    DOB: 01/24/1941  Age: 80 y.o. MRN: [id number]  CC: Edema (Bilateral feet)   HPI Christie Bruce presents for  follow-up of hypertension. Patient has no history of headache chest pain or shortness of breath or recent cough. Patient also denies symptoms of TIA such as focal numbness or weakness. Patient denies side effects from medication. States taking it regularly.  Swelling in legs last two weeks, but has increased salt intake. Pt. Says she will cut back. Denies dyspnea, chest pain.   History Christie Bruce has a past medical history of Asthma, Breast cancer ([place]), Chronic kidney disease, DCIS (ductal carcinoma in situ) of breast (09/12/2011), GERD (gastroesophageal reflux disease) (09/12/2011), Gout, Heart murmur, Hypertension, PONV (postoperative nausea and vomiting), Thyroid disease, and Uterine prolapse.   She has a past surgical history that includes bilateral mastectomy (07/19/10); Knee surgery (Left); Wrist surgery (Left); Intramedullary (im) nail intertrochanteric (Left, 12/28/2015); Cholecystectomy (N/A, 02/13/2016); and Hip surgery.   Her family history includes Cancer in her brother, father, mother, and paternal uncle; Diabetes in her brother and brother; Heart attack in her brother; Heart disease in her father; Hypertension in her father and sister; Stroke in her mother.She reports that she has quit smoking. Her smoking use included cigarettes. She quit after 15.00 years of use. She has never used smokeless tobacco. She reports that she does not drink alcohol and does not use drugs.  Current Outpatient Medications on File Prior to Visit  Medication Sig Dispense Refill  . albuterol (PROVENTIL HFA;VENTOLIN HFA) 108 (90 Base) MCG/ACT inhaler Inhale 2 puffs into the lungs every 6 (six) hours as needed for wheezing or shortness of breath. 1 Inhaler 0  . amLODipine (NORVASC) 5 MG tablet TAKE ONE (1) TABLET EACH DAY 90 tablet 1  . Cetirizine HCl (ZYRTEC  PO) Take by mouth.    . colchicine 0.6 MG tablet TAKE 1 TABLET AT THE ONSET OF SYMPTOMS MAY REPEAT IN 2 HOURS IF NEEDED (MAX OF 2 TABLETS DAILY) 30 tablet 0  . fexofenadine (ALLEGRA ALLERGY) 180 MG tablet Take 1 tablet (180 mg total) by mouth daily. 30 tablet 3  . fluticasone (FLONASE) 50 MCG/ACT nasal spray Place 2 sprays into both nostrils daily. 16 g 6  . ipratropium-albuterol (DUONEB) 0.5-2.5 (3) MG/3ML SOLN NEBULIZE 1 VIAL EVERY 6 HOURS AS NEEDED 360 mL 6  . levothyroxine (SYNTHROID) 75 MCG tablet TAKE ONE (1) TABLET EACH DAY 90 tablet 2  . omeprazole (PRILOSEC) 40 MG capsule TAKE ONE (1) CAPSULE EACH DAY 90 capsule 1  . Spacer/Aero Chamber Mouthpiece MISC 1 Units 4 (four) times daily by Does not apply route. 1 each 0   No current facility-administered medications on file prior to visit.    ROS Review of Systems  Constitutional: Negative.   HENT: Negative.   Eyes: Negative for visual disturbance.  Respiratory: Negative for shortness of breath.   Cardiovascular: Positive for leg swelling. Negative for chest pain.  Gastrointestinal: Negative for abdominal pain.  Musculoskeletal: Negative for arthralgias.    Objective:  BP (!) 160/76   Pulse 91   Temp 98.3 F (36.8 C)   Ht _0  (1.575 m)   Wt 143 lb 12.8 oz (65.2 kg)   SpO2 97%   BMI 26.30 kg/m   BP Readings from Last 3 Encounters:  11/22/20 (!) 160/76  09/29/20 (!) 158/76  09/27/20 (!) 157/80    Wt Readings from Last 3 Encounters:  11/22/20 143 lb 12.8  oz (65.2 kg)  09/29/20 140 lb (63.5 kg)  09/27/20 142 lb (64.4 kg)     Physical Exam Constitutional:      General: She is not in acute distress.    Appearance: She is well-developed.  HENT:     Head: Normocephalic and atraumatic.  Eyes:     Conjunctiva/sclera: Conjunctivae normal.     Pupils: Pupils are equal, round, and reactive to light.  Neck:     Thyroid: No thyromegaly.  Cardiovascular:     Rate and Rhythm: Normal rate and regular rhythm.     Heart  sounds: Normal heart sounds. No murmur heard.   Pulmonary:     Effort: Pulmonary effort is normal. No respiratory distress.     Breath sounds: Normal breath sounds. No wheezing or rales.  Abdominal:     General: Bowel sounds are normal. There is no distension.     Palpations: Abdomen is soft.     Tenderness: There is no abdominal tenderness.  Musculoskeletal:        General: Swelling (1+ BLE at ankles) present. Normal range of motion.     Cervical back: Normal range of motion and neck supple.  Lymphadenopathy:     Cervical: No cervical adenopathy.  Skin:    General: Skin is warm and dry.  Neurological:     Mental Status: She is alert and oriented to person, place, and time.  Psychiatric:        Behavior: Behavior normal.        Thought Content: Thought content normal.        Judgment: Judgment normal.       Assessment & Plan:   Christie Bruce was seen today for edema.  Diagnoses and all orders for this visit:  Hypothyroidism, unspecified type  Essential hypertension -     CMP14+EGFR -     CBC with Differential/Platelet -     TSH + free T4  Stage 3b chronic kidney disease ([facility]CC) -     CMP14+EGFR -     CBC with Differential/Platelet -     TSH + free T4  Anxiety -     ALPRAZolam (XANAX) 0.25 MG tablet; Take 1 tablet (0.25 mg total) by mouth 2 (two) times daily.  Other orders -     spironolactone (ALDACTONE) 25 MG tablet; Take 1 tablet (25 mg total) by mouth daily. For BP & swelling   Allergies as of 11/22/2020      Reactions   Celebrex [celecoxib]    Hydrochlorothiazide    Lisinopril Swelling   Sulfa Antibiotics    Had sores in my mouth, bottom of my feet "the skin just came right off"      Medication List       Accurate as of Nov 22, 2020 10:15 PM. If you have any questions, ask your nurse or doctor.        albuterol 108 (90 Base) MCG/ACT inhaler Commonly known as: VENTOLIN HFA Inhale 2 puffs into the lungs every 6 (six) hours as needed for wheezing or  shortness of breath.   ALPRAZolam 0.25 MG tablet Commonly known as: XANAX Take 1 tablet (0.25 mg total) by mouth 2 (two) times daily.   amLODipine 5 MG tablet Commonly known as: NORVASC TAKE ONE (1) TABLET EACH DAY   colchicine 0.6 MG tablet TAKE 1 TABLET AT THE ONSET OF SYMPTOMS MAY REPEAT IN 2 HOURS IF NEEDED (MAX OF 2 TABLETS DAILY)   fexofenadine 180 MG tablet Commonly known as: Allegra  Allergy Take 1 tablet (180 mg total) by mouth daily.   fluticasone 50 MCG/ACT nasal spray Commonly known as: FLONASE Place 2 sprays into both nostrils daily.   ipratropium-albuterol 0.5-2.5 (3) MG/3ML Soln Commonly known as: DUONEB NEBULIZE 1 VIAL EVERY 6 HOURS AS NEEDED   levothyroxine 75 MCG tablet Commonly known as: SYNTHROID TAKE ONE (1) TABLET EACH DAY   omeprazole 40 MG capsule Commonly known as: PRILOSEC TAKE ONE (1) CAPSULE EACH DAY   Spacer/Aero Chamber Mouthpiece Misc 1 Units 4 (four) times daily by Does not apply route.   spironolactone 25 MG tablet Commonly known as: ALDACTONE Take 1 tablet (25 mg total) by mouth daily. For BP & swelling Started by: Claretta Fraise, MD   ZYRTEC PO Take by mouth.       Meds ordered this encounter  Medications  . ALPRAZolam (XANAX) 0.25 MG tablet    Sig: Take 1 tablet (0.25 mg total) by mouth 2 (two) times daily.    Dispense:  60 tablet    Refill:  5  . spironolactone (ALDACTONE) 25 MG tablet    Sig: Take 1 tablet (25 mg total) by mouth daily. For BP & swelling    Dispense:  90 tablet    Refill:  3    Salt avoidance recommended  Follow-up: Return in about 6 weeks (around 01/03/2021).  Claretta Fraise, M.D.

## 2020-11-23 LAB — CMP14+EGFR
ALT: 13 IU/L (ref 0–32)
AST: 23 IU/L (ref 0–40)
Albumin/Globulin Ratio: 1.8 (ref 1.2–2.2)
Albumin: 4.6 g/dL (ref 3.7–4.7)
Alkaline Phosphatase: 128 IU/L — ABNORMAL HIGH (ref 44–121)
BUN/Creatinine Ratio: 15 (ref 12–28)
BUN: 16 mg/dL (ref 8–27)
Bilirubin Total: 1 mg/dL (ref 0.0–1.2)
CO2: 20 mmol/L (ref 20–29)
Calcium: 8.9 mg/dL (ref 8.7–10.3)
Chloride: 105 mmol/L (ref 96–106)
Creatinine, Ser: 1.06 mg/dL — ABNORMAL HIGH (ref 0.57–1.00)
Globulin, Total: 2.5 g/dL (ref 1.5–4.5)
Glucose: 101 mg/dL — ABNORMAL HIGH (ref 65–99)
Potassium: 3.9 mmol/L (ref 3.5–5.2)
Sodium: 142 mmol/L (ref 134–144)
Total Protein: 7.1 g/dL (ref 6.0–8.5)
eGFR: 53 mL/min/{1.73_m2} — ABNORMAL LOW (ref >59)

## 2020-11-23 LAB — CBC WITH DIFFERENTIAL/PLATELET
Basophils Absolute: 0 10*3/uL (ref 0.0–0.2)
Basos: 1 %
EOS (ABSOLUTE): 0.2 10*3/uL (ref 0.0–0.4)
Eos: 3 %
Hematocrit: 33.2 % — ABNORMAL LOW (ref 34.0–46.6)
Hemoglobin: 10.8 g/dL — ABNORMAL LOW (ref 11.1–15.9)
Immature Grans (Abs): 0 10*3/uL (ref 0.0–0.1)
Immature Granulocytes: 0 %
Lymphocytes Absolute: 2.4 10*3/uL (ref 0.7–3.1)
Lymphs: 44 %
MCH: 29.3 pg (ref 26.6–33.0)
MCHC: 32.5 g/dL (ref 31.5–35.7)
MCV: 90 fL (ref 79–97)
Monocytes Absolute: 0.5 10*3/uL (ref 0.1–0.9)
Monocytes: 9 %
Neutrophils Absolute: 2.3 10*3/uL (ref 1.4–7.0)
Neutrophils: 43 %
Platelets: 238 10*3/uL (ref 150–450)
RBC: 3.68 x10E6/uL — ABNORMAL LOW (ref 3.77–5.28)
RDW: 13.4 % (ref 11.7–15.4)
WBC: 5.5 10*3/uL (ref 3.4–10.8)

## 2020-11-23 LAB — TSH+FREE T4
Free T4: 0.95 ng/dL (ref 0.82–1.77)
TSH: 7.47 u[IU]/mL — ABNORMAL HIGH (ref 0.450–4.500)

## 2020-11-27 ENCOUNTER — Other Ambulatory Visit: Payer: Self-pay | Admitting: Family Medicine

## 2020-11-27 MED ORDER — LEVOTHYROXINE SODIUM 100 MCG PO TABS: 100.0000 ug | ORAL_TABLET | Freq: Every day | ORAL | 1 refills | Status: DC

## 2020-11-28 ENCOUNTER — Other Ambulatory Visit: Payer: Self-pay | Admitting: *Deleted

## 2020-11-28 DIAGNOSIS — E039 Hypothyroidism, unspecified: Secondary | ICD-10-CM

## 2020-12-01 ENCOUNTER — Other Ambulatory Visit: Payer: Self-pay | Admitting: Family Medicine

## 2020-12-01 ENCOUNTER — Other Ambulatory Visit: Payer: Self-pay

## 2020-12-01 DIAGNOSIS — E039 Hypothyroidism, unspecified: Secondary | ICD-10-CM

## 2020-12-01 DIAGNOSIS — M7989 Other specified soft tissue disorders: Secondary | ICD-10-CM

## 2020-12-28 ENCOUNTER — Other Ambulatory Visit: Payer: Self-pay | Admitting: Family Medicine

## 2021-01-02 ENCOUNTER — Other Ambulatory Visit: Payer: Self-pay | Admitting: Family Medicine

## 2021-01-02 DIAGNOSIS — M7989 Other specified soft tissue disorders: Secondary | ICD-10-CM

## 2021-01-24 ENCOUNTER — Other Ambulatory Visit: Payer: Medicare Other

## 2021-01-24 ENCOUNTER — Other Ambulatory Visit: Payer: Self-pay

## 2021-01-24 DIAGNOSIS — E039 Hypothyroidism, unspecified: Secondary | ICD-10-CM

## 2021-01-25 LAB — TSH+FREE T4
Free T4: 1.47 ng/dL (ref 0.82–1.77)
TSH: 1.09 u[IU]/mL (ref 0.450–4.500)

## 2021-01-25 NOTE — Progress Notes (Signed)
Hello Irene,  Your lab result is normal and/or stable.Some minor variations that are not significant are commonly marked abnormal, but do not represent any medical problem for you.  Best regards, Quinlan Mcfall, M.D.

## 2021-01-27 ENCOUNTER — Ambulatory Visit: Payer: Medicare Other | Admitting: Obstetrics & Gynecology

## 2021-01-27 ENCOUNTER — Encounter: Payer: Self-pay | Admitting: Obstetrics & Gynecology

## 2021-01-27 ENCOUNTER — Other Ambulatory Visit: Payer: Self-pay

## 2021-01-27 VITALS — BP 195/94 | HR 82 | Ht 62.0 in | Wt 140.0 lb

## 2021-01-27 DIAGNOSIS — Z4689 Encounter for fitting and adjustment of other specified devices: Secondary | ICD-10-CM

## 2021-01-27 DIAGNOSIS — N813 Complete uterovaginal prolapse: Secondary | ICD-10-CM

## 2021-01-27 NOTE — Progress Notes (Signed)
Chief Complaint  Patient presents with   pessary maintenance    Blood pressure (!) 195/94, pulse 82, height 5\' 2"  (1.575 m), weight 140 lb (63.5 kg).  Christie Bruce presents today for routine follow up related to her pessary.   She uses a Milex ring with suppor #2 She reports no vaginal discharge and no vaginal bleeding   Likert scale(1 not bothersome -5 very bothersome)  :  1  Exam reveals no undue vaginal mucosal pressure of breakdown, no discharge and no vaginal bleeding.  Vaginal Epithelial Abnormality Classification System:   0 0    No abnormalities 1    Epithelial erythema 2    Granulation tissue 3    Epithelial break or erosion, 1 cm or less 4    Epithelial break or erosion, 1 cm or greater  The pessary is removed, cleaned and replaced without difficulty.      ICD-10-CM   1. Pessary maintenance, Milex ring with support #2  Z46.89     2. Uterovaginal prolapse, complete  N81.3        Christie Bruce will be sen back in 4 months for continued follow up.  Florian Buff, MD  01/27/2021 10:10 AM

## 2021-01-30 ENCOUNTER — Other Ambulatory Visit: Payer: Self-pay | Admitting: Family Medicine

## 2021-01-30 DIAGNOSIS — I10 Essential (primary) hypertension: Secondary | ICD-10-CM

## 2021-02-01 ENCOUNTER — Other Ambulatory Visit: Payer: Self-pay

## 2021-02-01 ENCOUNTER — Ambulatory Visit (INDEPENDENT_AMBULATORY_CARE_PROVIDER_SITE_OTHER): Payer: Medicare Other | Admitting: Family Medicine

## 2021-02-01 ENCOUNTER — Encounter: Payer: Self-pay | Admitting: Family Medicine

## 2021-02-01 VITALS — BP 139/63 | HR 89 | Temp 97.7°F | Ht 62.0 in | Wt 138.8 lb

## 2021-02-01 DIAGNOSIS — E039 Hypothyroidism, unspecified: Secondary | ICD-10-CM

## 2021-02-01 DIAGNOSIS — J309 Allergic rhinitis, unspecified: Secondary | ICD-10-CM | POA: Diagnosis not present

## 2021-02-01 MED ORDER — FEXOFENADINE HCL 60 MG PO TABS: 60.0000 mg | ORAL_TABLET | Freq: Two times a day (BID) | ORAL | 3 refills | Status: DC

## 2021-02-01 NOTE — Progress Notes (Signed)
Subjective:  Patient ID: Christie Bruce, female    DOB: 24-Feb-1941  Age: 80 y.o. MRN: [id number]  CC: Hypothyroidism   HPI [name] presents for recheck of her thyroid.  She has been taking her medicine irregularly.  When her TSH came back high and we addressed that with her she started taking it every day she says.  She feels better now.  She also mentions that she was taking the fluid pill but not the amlodipine because she thought 1 replace the other.  She went to her female doctor a few days ago and her systolic pressure was 170.  As result she started taking both.  As of today she has been taking the amlodipine and the spironolactone for about 4 days.  No side effects noted.  She also mentions that she was taking the colchicine every day instead of as needed.  She recognized her air when she finally read the bottle.  She has a refill available she says so she is going to back off on it and start taking it just when she has a gout flare.  Similarly she has not been taking the Allegra because she was taking the nasal spray.  She is having sneezing sniffling drainage and nasal congestion.  Depression screen [facility] 2/9 02/01/2021 11/22/2020 09/29/2020  Decreased Interest 0 0 0  Down, Depressed, Hopeless 0 0 0  PHQ - 2 Score 0 0 0  Altered sleeping - - -  Tired, decreased energy - - -  Change in appetite - - -  Feeling bad or failure about yourself  - - -  Trouble concentrating - - -  Moving slowly or fidgety/restless - - -  Suicidal thoughts - - -  PHQ-9 Score - - -  Difficult doing work/chores - - -    History Cherell has a past medical history of Asthma, Breast cancer ([place]), Chronic kidney disease, DCIS (ductal carcinoma in situ) of breast (09/12/2011), GERD (gastroesophageal reflux disease) (09/12/2011), Gout, Heart murmur, Hypertension, PONV (postoperative nausea and vomiting), Thyroid disease, and Uterine prolapse.   She has a past surgical history that includes bilateral mastectomy  (07/19/10); Knee surgery (Left); Wrist surgery (Left); Intramedullary (im) nail intertrochanteric (Left, 12/28/2015); Cholecystectomy (N/A, 02/13/2016); and Hip surgery.   Her family history includes Cancer in her brother, father, mother, and paternal uncle; Diabetes in her brother and brother; Heart attack in her brother; Heart disease in her father; Hypertension in her father and sister; Stroke in her mother.She reports that she has quit smoking. Her smoking use included cigarettes. She has never used smokeless tobacco. She reports that she does not drink alcohol and does not use drugs.    ROS Review of Systems  Constitutional: Negative.   HENT:  Positive for congestion, postnasal drip, rhinorrhea and sneezing.   Eyes:  Positive for itching. Negative for visual disturbance.  Respiratory:  Negative for shortness of breath.   Cardiovascular:  Negative for chest pain.  Gastrointestinal:  Negative for abdominal pain.  Musculoskeletal:  Negative for arthralgias.   Objective:  BP 139/63   Pulse 89   Temp 97.7 F (36.5 C)   Ht 5\' 2"  (1.575 m)   Wt 138 lb 12.8 oz (63 kg)   SpO2 99%   BMI 25.39 kg/m   BP Readings from Last 3 Encounters:  02/01/21 139/63  01/27/21 (!) 195/94  11/22/20 (!) 160/76    Wt Readings from Last 3 Encounters:  02/01/21 138 lb 12.8 oz (63 kg)  01/27/21 140 lb (63.5 kg)  11/22/20 143 lb 12.8 oz (65.2 kg)     Physical Exam Constitutional:      General: She is not in acute distress.    Appearance: She is well-developed.  Cardiovascular:     Rate and Rhythm: Normal rate and regular rhythm.  Pulmonary:     Breath sounds: Normal breath sounds.  Musculoskeletal:        General: Normal range of motion.  Skin:    General: Skin is warm and dry.  Neurological:     Mental Status: She is alert and oriented to person, place, and time.      Assessment & Plan:   Camber was seen today for hypothyroidism.  Diagnoses and all orders for this  visit:  Hypothyroidism, unspecified type  Allergic rhinitis, unspecified seasonality, unspecified trigger  Other orders -     fexofenadine (ALLEGRA) 60 MG tablet; Take 1 tablet (60 mg total) by mouth 2 (two) times daily. For allergy and sinus      I have discontinued Kaedyn L. Sailors's fexofenadine. I am also having her start on fexofenadine. Additionally, I am having her maintain her [employer], albuterol, Cetirizine HCl (ZYRTEC PO), ipratropium-albuterol, fluticasone, omeprazole, ALPRAZolam, spironolactone, levothyroxine, colchicine, and amLODipine.  Allergies as of 02/01/2021       Reactions   Celebrex [celecoxib]    Hydrochlorothiazide    Lisinopril Swelling   Sulfa Antibiotics    Had sores in my mouth, bottom of my feet "the skin just came right off"        Medication List        Accurate as of February 01, 2021 10:32 AM. If you have any questions, ask your nurse or doctor.          albuterol 108 (90 Base) MCG/ACT inhaler Commonly known as: VENTOLIN HFA Inhale 2 puffs into the lungs every 6 (six) hours as needed for wheezing or shortness of breath.   ALPRAZolam 0.25 MG tablet Commonly known as: XANAX Take 1 tablet (0.25 mg total) by mouth 2 (two) times daily.   amLODipine 5 MG tablet Commonly known as: NORVASC TAKE ONE (1) TABLET EACH DAY   colchicine 0.6 MG tablet TAKE 1 TABLET AT THE ONSET OF SYMPTOMS MAY REPEAT IN 2 HOURS IF NEEDED (MAX OF 2 TABLETS DAILY)   fexofenadine 60 MG tablet Commonly known as: ALLEGRA Take 1 tablet (60 mg total) by mouth 2 (two) times daily. For allergy and sinus What changed:  medication strength how much to take when to take this additional instructions Changed by: Claretta Fraise, MD   fluticasone 50 MCG/ACT nasal spray Commonly known as: FLONASE Place 2 sprays into both nostrils daily.   ipratropium-albuterol 0.5-2.5 (3) MG/3ML Soln Commonly known as: DUONEB NEBULIZE 1 VIAL EVERY 6 HOURS AS NEEDED    levothyroxine 100 MCG tablet Commonly known as: SYNTHROID TAKE ONE TABLET EACH MORNING BEFORE BREAKFAST   omeprazole 40 MG capsule Commonly known as: PRILOSEC TAKE ONE (1) CAPSULE EACH DAY   Spacer/Aero Chamber Mouthpiece Misc 1 Units 4 (four) times daily by Does not apply route.   spironolactone 25 MG tablet Commonly known as: ALDACTONE Take 1 tablet (25 mg total) by mouth daily. For BP & swelling   ZYRTEC PO Take by mouth.         Follow-up: Return in about 6 months (around 08/04/2021).  Claretta Fraise, M.D.

## 2021-04-13 ENCOUNTER — Other Ambulatory Visit: Payer: Self-pay | Admitting: Family Medicine

## 2021-04-13 DIAGNOSIS — I10 Essential (primary) hypertension: Secondary | ICD-10-CM

## 2021-05-12 ENCOUNTER — Other Ambulatory Visit: Payer: Self-pay | Admitting: Family Medicine

## 2021-05-12 DIAGNOSIS — K219 Gastro-esophageal reflux disease without esophagitis: Secondary | ICD-10-CM

## 2021-05-30 ENCOUNTER — Ambulatory Visit: Payer: Medicare Other | Admitting: Obstetrics & Gynecology

## 2021-05-30 ENCOUNTER — Encounter: Payer: Self-pay | Admitting: Obstetrics & Gynecology

## 2021-05-30 ENCOUNTER — Other Ambulatory Visit: Payer: Self-pay

## 2021-05-30 VITALS — BP 145/75 | HR 80 | Ht 62.0 in | Wt 142.0 lb

## 2021-05-30 DIAGNOSIS — N813 Complete uterovaginal prolapse: Secondary | ICD-10-CM

## 2021-05-30 DIAGNOSIS — Z4689 Encounter for fitting and adjustment of other specified devices: Secondary | ICD-10-CM

## 2021-05-30 NOTE — Progress Notes (Signed)
Chief Complaint  Patient presents with   Pessary Check    Blood pressure (!) 145/75, pulse 80, height 5\' 2"  (1.575 m), weight 142 lb (64.4 kg).  Christie Bruce presents today for routine follow up related to her pessary.   She uses a Nilex ring with support #2 She reports no vaginal discharge and no vaginal bleeding   Likert scale(1 not bothersome -5 very bothersome)  :  1  Exam reveals no undue vaginal mucosal pressure of breakdown, no discharge and no vaginal bleeding.  Vaginal Epithelial Abnormality Classification System:   0 0    No abnormalities 1    Epithelial erythema 2    Granulation tissue 3    Epithelial break or erosion, 1 cm or less 4    Epithelial break or erosion, 1 cm or greater  The pessary is removed, cleaned and replaced without difficulty.      ICD-10-CM   1. Pessary maintenance, Milex ring with support #2  Z46.89     2. Uterovaginal prolapse, complete  N81.3        Christie Bruce will be sen back in 4 months for continued follow up.  Florian Buff, MD  05/30/2021 9:47 AM

## 2021-05-31 ENCOUNTER — Telehealth: Payer: Self-pay | Admitting: Family Medicine

## 2021-05-31 NOTE — Telephone Encounter (Signed)
Left message for patient to call back and schedule Medicare Annual Wellness Visit (AWV) either virtually or in office.   *due 07/23/2009 awvi per [place]   please schedule at anytime with health coach  This should be a 45 minute visit.

## 2021-06-21 ENCOUNTER — Other Ambulatory Visit: Payer: Self-pay | Admitting: Family Medicine

## 2021-06-21 DIAGNOSIS — F419 Anxiety disorder, unspecified: Secondary | ICD-10-CM

## 2021-06-26 ENCOUNTER — Other Ambulatory Visit: Payer: Self-pay | Admitting: Family Medicine

## 2021-07-03 ENCOUNTER — Other Ambulatory Visit: Payer: Self-pay | Admitting: Family Medicine

## 2021-07-03 DIAGNOSIS — I10 Essential (primary) hypertension: Secondary | ICD-10-CM

## 2021-07-10 ENCOUNTER — Ambulatory Visit (INDEPENDENT_AMBULATORY_CARE_PROVIDER_SITE_OTHER): Payer: Medicare Other | Admitting: Nurse Practitioner

## 2021-07-10 ENCOUNTER — Encounter: Payer: Self-pay | Admitting: Nurse Practitioner

## 2021-07-10 DIAGNOSIS — U071 COVID-19: Secondary | ICD-10-CM | POA: Diagnosis not present

## 2021-07-10 NOTE — Patient Instructions (Signed)
COVID-19: Quarantine and Isolation °Quarantine °If you were exposed °Quarantine and stay away from others when you have been in close contact with someone who has COVID-19. °Isolate °If you are sick or test positive °Isolate when you are sick or when you have COVID-19, even if you don't have symptoms. °When to stay home °Calculating quarantine °The date of your exposure is considered day 0. Day 1 is the first full day after your last contact with a person who has had COVID-19. Stay home and away from other people for at least 5 days. Learn why CDC updated guidance for the general public. °IF YOU were exposed to COVID-19 and are NOT  °up to dateIF YOU were exposed to COVID-19 and are NOT on COVID-19 vaccinations °Quarantine for at least 5 days °Stay home °Stay home and quarantine for at least 5 full days. °Wear a well-fitting mask if you must be around others in your home. °Do not travel. °Get tested °Even if you don't develop symptoms, get tested at least 5 days after you last had close contact with someone with COVID-19. °After quarantine °Watch for symptoms °Watch for symptoms until 10 days after you last had close contact with someone with COVID-19. °Avoid travel °It is best to avoid travel until a full 10 days after you last had close contact with someone with COVID-19. °If you develop symptoms °Isolate immediately and get tested. Continue to stay home until you know the results. Wear a well-fitting mask around others. °Take precautions until day 10 °Wear a well-fitting mask °Wear a well-fitting mask for 10 full days any time you are around others inside your home or in public. Do not go to places where you are unable to wear a well-fitting mask. °If you must travel during days 6-10, take precautions. °Avoid being around people who are more likely to get very sick from COVID-19. °IF YOU were exposed to COVID-19 and are  °up to dateIF YOU were exposed to COVID-19 and are on COVID-19 vaccinations °No  quarantine °You do not need to stay home unless you develop symptoms. °Get tested °Even if you don't develop symptoms, get tested at least 5 days after you last had close contact with someone with COVID-19. °Watch for symptoms °Watch for symptoms until 10 days after you last had close contact with someone with COVID-19. °If you develop symptoms °Isolate immediately and get tested. Continue to stay home until you know the results. Wear a well-fitting mask around others. °Take precautions until day 10 °Wear a well-fitting mask °Wear a well-fitting mask for 10 full days any time you are around others inside your home or in public. Do not go to places where you are unable to wear a well-fitting mask. °Take precautions if traveling °Avoid being around people who are more likely to get very sick from COVID-19. °IF YOU were exposed to COVID-19 and had confirmed COVID-19 within the past 90 days (you tested positive using a viral test) °No quarantine °You do not need to stay home unless you develop symptoms. °Watch for symptoms °Watch for symptoms until 10 days after you last had close contact with someone with COVID-19. °If you develop symptoms °Isolate immediately and get tested. Continue to stay home until you know the results. Wear a well-fitting mask around others. °Take precautions until day 10 °Wear a well-fitting mask °Wear a well-fitting mask for 10 full days any time you are around others inside your home or in public. Do not go to places where you are   unable to wear a well-fitting mask. °Take precautions if traveling °Avoid being around people who are more likely to get very sick from COVID-19. °Calculating isolation °Day 0 is your first day of symptoms or a positive viral test. Day 1 is the first full day after your symptoms developed or your test specimen was collected. If you have COVID-19 or have symptoms, isolate for at least 5 days. °IF YOU tested positive for COVID-19 or have symptoms, regardless of  vaccination status °Stay home for at least 5 days °Stay home for 5 days and isolate from others in your home. °Wear a well-fitting mask if you must be around others in your home. °Do not travel. °Ending isolation if you had symptoms °End isolation after 5 full days if you are fever-free for 24 hours (without the use of fever-reducing medication) and your symptoms are improving. °Ending isolation if you did NOT have symptoms °End isolation after at least 5 full days after your positive test. °If you got very sick from COVID-19 or have a weakened immune system °You should isolate for at least 10 days. Consult your doctor before ending isolation. °Take precautions until day 10 °Wear a well-fitting mask °Wear a well-fitting mask for 10 full days any time you are around others inside your home or in public. Do not go to places where you are unable to wear a well-fitting mask. °Do not travel °Do not travel until a full 10 days after your symptoms started or the date your positive test was taken if you had no symptoms. °Avoid being around people who are more likely to get very sick from COVID-19. °Definitions °Exposure °Contact with someone infected with SARS-CoV-2, the virus that causes COVID-19, in a way that increases the likelihood of getting infected with the virus. °Close contact °A close contact is someone who was less than 6 feet away from an infected person (laboratory-confirmed or a clinical diagnosis) for a cumulative total of 15 minutes or more over a 24-hour period. For example, three individual 5-minute exposures for a total of 15 minutes. People who are exposed to someone with COVID-19 after they completed at least 5 days of isolation are not considered close contacts. °Quarantine °Quarantine is a strategy used to prevent transmission of COVID-19 by keeping people who have been in close contact with someone with COVID-19 apart from others. °Who does not need to quarantine? °If you had close contact with  someone with COVID-19 and you are in one of the following groups, you do not need to quarantine. °You are up to date with your COVID-19 vaccines. °You had confirmed COVID-19 within the last 90 days (meaning you tested positive using a viral test). °If you are up to date with COVID-19 vaccines, you should wear a well-fitting mask around others for 10 days from the date of your last close contact with someone with COVID-19 (the date of last close contact is considered day 0). Get tested at least 5 days after you last had close contact with someone with COVID-19. If you test positive or develop COVID-19 symptoms, isolate from other people and follow recommendations in the Isolation section below. If you tested positive for COVID-19 with a viral test within the previous 90 days and subsequently recovered and remain without COVID-19 symptoms, you do not need to quarantine or get tested after close contact. You should wear a well-fitting mask around others for 10 days from the date of your last close contact with someone with COVID-19 (the date of last   close contact is considered day 0). If you have COVID-19 symptoms, get tested and isolate from other people and follow recommendations in the Isolation section below. °Who should quarantine? °If you come into close contact with someone with COVID-19, you should quarantine if you are not up to date on COVID-19 vaccines. This includes people who are not vaccinated. °What to do for quarantine °Stay home and away from other people for at least 5 days (day 0 through day 5) after your last contact with a person who has COVID-19. The date of your exposure is considered day 0. Wear a well-fitting mask when around others at home, if possible. °For 10 days after your last close contact with someone with COVID-19, watch for fever (100.4°F or greater), cough, shortness of breath, or other COVID-19 symptoms. °If you develop symptoms, get tested immediately and isolate until you receive  your test results. If you test positive, follow isolation recommendations. °If you do not develop symptoms, get tested at least 5 days after you last had close contact with someone with COVID-19. °If you test negative, you can leave your home, but continue to wear a well-fitting mask when around others at home and in public until 10 days after your last close contact with someone with COVID-19. °If you test positive, you should isolate for at least 5 days from the date of your positive test (if you do not have symptoms). If you do develop COVID-19 symptoms, isolate for at least 5 days from the date your symptoms began (the date the symptoms started is day 0). Follow recommendations in the isolation section below. °If you are unable to get a test 5 days after last close contact with someone with COVID-19, you can leave your home after day 5 if you have been without COVID-19 symptoms throughout the 5-day period. Wear a well-fitting mask for 10 days after your date of last close contact when around others at home and in public. °Avoid people who are have weakened immune systems or are more likely to get very sick from COVID-19, and nursing homes and other high-risk settings, until after at least 10 days. °If possible, stay away from people you live with, especially people who are at higher risk for getting very sick from COVID-19, as well as others outside your home throughout the full 10 days after your last close contact with someone with COVID-19. °If you are unable to quarantine, you should wear a well-fitting mask for 10 days when around others at home and in public. °If you are unable to wear a mask when around others, you should continue to quarantine for 10 days. Avoid people who have weakened immune systems or are more likely to get very sick from COVID-19, and nursing homes and other high-risk settings, until after at least 10 days. °See additional information about travel. °Do not go to places where you are  unable to wear a mask, such as restaurants and some gyms, and avoid eating around others at home and at work until after 10 days after your last close contact with someone with COVID-19. °After quarantine °Watch for symptoms until 10 days after your last close contact with someone with COVID-19. °If you have symptoms, isolate immediately and get tested. °Quarantine in high-risk congregate settings °In certain congregate settings that have high risk of secondary transmission (such as correctional and detention facilities, homeless shelters, or cruise ships), CDC recommends a 10-day quarantine for residents, regardless of vaccination and booster status. During periods of critical staffing   shortages, facilities may consider shortening the quarantine period for staff to ensure continuity of operations. Decisions to shorten quarantine in these settings should be made in consultation with state, local, tribal, or territorial health departments and should take into consideration the context and characteristics of the facility. CDC's setting-specific guidance provides additional recommendations for these settings. °Isolation °Isolation is used to separate people with confirmed or suspected COVID-19 from those without COVID-19. People who are in isolation should stay home until it's safe for them to be around others. At home, anyone sick or infected should separate from others, or wear a well-fitting mask when they need to be around others. People in isolation should stay in a specific "sick room" or area and use a separate bathroom if available. Everyone who has presumed or confirmed COVID-19 should stay home and isolate from other people for at least 5 full days (day 0 is the first day of symptoms or the date of the day of the positive viral test for asymptomatic persons). They should wear a mask when around others at home and in public for an additional 5 days. People who are confirmed to have COVID-19 or are showing  symptoms of COVID-19 need to isolate regardless of their vaccination status. This includes: °People who have a positive viral test for COVID-19, regardless of whether or not they have symptoms. °People with symptoms of COVID-19, including people who are awaiting test results or have not been tested. People with symptoms should isolate even if they do not know if they have been in close contact with someone with COVID-19. °What to do for isolation °Monitor your symptoms. If you have an emergency warning sign (including trouble breathing), seek emergency medical care immediately. °Stay in a separate room from other household members, if possible. °Use a separate bathroom, if possible. °Take steps to improve ventilation at home, if possible. °Avoid contact with other members of the household and pets. °Don't share personal household items, like cups, towels, and utensils. °Wear a well-fitting mask when you need to be around other people. °Learn more about what to do if you are sick and how to notify your contacts. °Ending isolation for people who had COVID-19 and had symptoms °If you had COVID-19 and had symptoms, isolate for at least 5 days. To calculate your 5-day isolation period, day 0 is your first day of symptoms. Day 1 is the first full day after your symptoms developed. You can leave isolation after 5 full days. °You can end isolation after 5 full days if you are fever-free for 24 hours without the use of fever-reducing medication and your other symptoms have improved (Loss of taste and smell may persist for weeks or months after recovery and need not delay the end of isolation). °You should continue to wear a well-fitting mask around others at home and in public for 5 additional days (day 6 through day 10) after the end of your 5-day isolation period. If you are unable to wear a mask when around others, you should continue to isolate for a full 10 days. Avoid people who have weakened immune systems or are more  likely to get very sick from COVID-19, and nursing homes and other high-risk settings, until after at least 10 days. °If you continue to have fever or your other symptoms have not improved after 5 days of isolation, you should wait to end your isolation until you are fever-free for 24 hours without the use of fever-reducing medication and your other symptoms have improved.   Continue to wear a well-fitting mask through day 10. Contact your healthcare provider if you have questions. °See additional information about travel. °Do not go to places where you are unable to wear a mask, such as restaurants and some gyms, and avoid eating around others at home and at work until a full 10 days after your first day of symptoms. °If an individual has access to a test and wants to test, the best approach is to use an antigen test1 towards the end of the 5-day isolation period. Collect the test sample only if you are fever-free for 24 hours without the use of fever-reducing medication and your other symptoms have improved (loss of taste and smell may persist for weeks or months after recovery and need not delay the end of isolation). If your test result is positive, you should continue to isolate until day 10. If your test result is negative, you can end isolation, but continue to wear a well-fitting mask around others at home and in public until day 10. Follow additional recommendations for masking and avoiding travel as described above. °1As noted in the labeling for authorized over-the counter antigen tests: Negative results should be treated as presumptive. Negative results do not rule out SARS-CoV-2 infection and should not be used as the sole basis for treatment or patient management decisions, including infection control decisions. To improve results, antigen tests should be used twice over a three-day period with at least 24 hours and no more than 48 hours between tests. °Note that these recommendations on ending isolation  do not apply to people who are moderately ill or very sick from COVID-19 or have weakened immune systems. See section below for recommendations for when to end isolation for these groups. °Ending isolation for people who tested positive for COVID-19 but had no symptoms °If you test positive for COVID-19 and never develop symptoms, isolate for at least 5 days. Day 0 is the day of your positive viral test (based on the date you were tested) and day 1 is the first full day after the specimen was collected for your positive test. You can leave isolation after 5 full days. °If you continue to have no symptoms, you can end isolation after at least 5 days. °You should continue to wear a well-fitting mask around others at home and in public until day 10 (day 6 through day 10). If you are unable to wear a mask when around others, you should continue to isolate for 10 days. Avoid people who have weakened immune systems or are more likely to get very sick from COVID-19, and nursing homes and other high-risk settings, until after at least 10 days. °If you develop symptoms after testing positive, your 5-day isolation period should start over. Day 0 is your first day of symptoms. Follow the recommendations above for ending isolation for people who had COVID-19 and had symptoms. °See additional information about travel. °Do not go to places where you are unable to wear a mask, such as restaurants and some gyms, and avoid eating around others at home and at work until 10 days after the day of your positive test. °If an individual has access to a test and wants to test, the best approach is to use an antigen test1 towards the end of the 5-day isolation period. If your test result is positive, you should continue to isolate until day 10. If your test result is positive, you can also choose to test daily and if your test result   is negative, you can end isolation, but continue to wear a well-fitting mask around others at home and in  public until day 10. Follow additional recommendations for masking and avoiding travel as described above. °1As noted in the labeling for authorized over-the counter antigen tests: Negative results should be treated as presumptive. Negative results do not rule out SARS-CoV-2 infection and should not be used as the sole basis for treatment or patient management decisions, including infection control decisions. To improve results, antigen tests should be used twice over a three-day period with at least 24 hours and no more than 48 hours between tests. °Ending isolation for people who were moderately or very sick from COVID-19 or have a weakened immune system °People who are moderately ill from COVID-19 (experiencing symptoms that affect the lungs like shortness of breath or difficulty breathing) should isolate for 10 days and follow all other isolation precautions. To calculate your 10-day isolation period, day 0 is your first day of symptoms. Day 1 is the first full day after your symptoms developed. If you are unsure if your symptoms are moderate, talk to a healthcare provider for further guidance. °People who are very sick from COVID-19 (this means people who were hospitalized or required intensive care or ventilation support) and people who have weakened immune systems might need to isolate at home longer. They may also require testing with a viral test to determine when they can be around others. CDC recommends an isolation period of at least 10 and up to 20 days for people who were very sick from COVID-19 and for people with weakened immune systems. Consult with your healthcare provider about when you can resume being around other people. If you are unsure if your symptoms are severe or if you have a weakened immune system, talk to a healthcare provider for further guidance. °People who have a weakened immune system should talk to their healthcare provider about the potential for reduced immune responses to  COVID-19 vaccines and the need to continue to follow current prevention measures (including wearing a well-fitting mask and avoiding crowds and poorly ventilated indoor spaces) to protect themselves against COVID-19 until advised otherwise by their healthcare provider. Close contacts of immunocompromised people--including household members--should also be encouraged to receive all recommended COVID-19 vaccine doses to help protect these people. °Isolation in high-risk congregate settings °In certain high-risk congregate settings that have high risk of secondary transmission and where it is not feasible to cohort people (such as correctional and detention facilities, homeless shelters, and cruise ships), CDC recommends a 10-day isolation period for residents. During periods of critical staffing shortages, facilities may consider shortening the isolation period for staff to ensure continuity of operations. Decisions to shorten isolation in these settings should be made in consultation with state, local, tribal, or territorial health departments and should take into consideration the context and characteristics of the facility. CDC's setting-specific guidance provides additional recommendations for these settings. °This CDC guidance is meant to supplement--not replace--any federal, state, local, territorial, or tribal health and safety laws, rules, and regulations. °Recommendations for specific settings °These recommendations do not apply to healthcare professionals. For guidance specific to these settings, see °Healthcare professionals: Interim Guidance for Managing Healthcare Personnel with SARS-CoV-2 Infection or Exposure to SARS-CoV-2 °Patients, residents, and visitors to healthcare settings: Interim Infection Prevention and Control Recommendations for Healthcare Personnel During the Coronavirus Disease 2019 (COVID-19) Pandemic °Additional setting-specific guidance and recommendations are available. °These  recommendations on quarantine and isolation do apply to K[employer] School   settings. Additional guidance is available here: Overview of COVID-19 Quarantine for K-12 Schools °Travelers: Travel information and recommendations °Congregate facilities and other settings: guidance pages for community, work, and school settings °Ongoing COVID-19 exposure FAQs °I live with someone with COVID-19, but I cannot be separated from them. How do we manage quarantine in this situation? °It is very important for people with COVID-19 to remain apart from other people, if possible, even if they are living together. If separation of the person with COVID-19 from others that they live with is not possible, the other people that they live with will have ongoing exposure, meaning they will be repeatedly exposed until that person is no longer able to spread the virus to other people. In this situation, there are precautions you can take to limit the spread of COVID-19: °The person with COVID-19 and everyone they live with should wear a well-fitting mask inside the home. °If possible, one person should care for the person with COVID-19 to limit the number of people who are in close contact with the infected person. °Take steps to protect yourself and others to reduce transmission in the home: °Quarantine if you are not up to date with your COVID-19 vaccines. °Isolate if you are sick or tested positive for COVID-19, even if you don't have symptoms. °Learn more about the public health recommendations for testing, mask use and quarantine of close contacts, like yourself, who have ongoing exposure. These recommendations differ depending on your vaccination status. °What should I do if I have ongoing exposure to COVID-19 from someone I live with? °Recommendations for this situation depend on your vaccination status: °If you are not up to date on COVID-19 vaccines and have ongoing exposure to COVID-19, you should: °Begin quarantine immediately and  continue to quarantine throughout the isolation period of the person with COVID-19. °Continue to quarantine for an additional 5 days starting the day after the end of isolation for the person with COVID-19. °Get tested at least 5 days after the end of isolation of the infected person that lives with them. °If you test negative, you can leave the home but should continue to wear a well-fitting mask when around others at home and in public until 10 days after the end of isolation for the person with COVID-19. °Isolate immediately if you develop symptoms of COVID-19 or test positive. °If you are up to date with COVID-19 vaccines and have ongoing exposure to COVID-19, you should: °Get tested at least 5 days after your first exposure. A person with COVID-19 is considered infectious starting 2 days before they develop symptoms, or 2 days before the date of their positive test if they do not have symptoms. °Get tested again at least 5 days after the end of isolation for the person with COVID-19. °Wear a well-fitting mask when you are around the person with COVID-19, and do this throughout their isolation period. °Wear a well-fitting mask around others for 10 days after the infected person's isolation period ends. °Isolate immediately if you develop symptoms of COVID-19 or test positive. °What should I do if multiple people I live with test positive for COVID-19 at different times? °Recommendations for this situation depend on your vaccination status: °If you are not up to date with your COVID-19 vaccines, you should: °Quarantine throughout the isolation period of any infected person that you live with. °Continue to quarantine until 5 days after the end of isolation date for the most recently infected person that lives with you. For example, if   the last day of isolation of the person most recently infected with COVID-19 was June 30, the new 5-day quarantine period starts on July 1. °Get tested at least 5 days after the end  of isolation for the most recently infected person that lives with you. °Wear a well-fitting mask when you are around any person with COVID-19 while that person is in isolation. °Wear a well-fitting mask when you are around other people until 10 days after your last close contact. °Isolate immediately if you develop symptoms of COVID-19 or test positive. °If you are up to date with your COVID-19 vaccines, you should: °Get tested at least 5 days after your first exposure. A person with COVID-19 is considered infectious starting 2 days before they developed symptoms, or 2 days before the date of their positive test if they do not have symptoms. °Get tested again at least 5 days after the end of isolation for the most recently infected person that lives with you. °Wear a well-fitting mask when you are around any person with COVID-19 while that person is in isolation. °Wear a well-fitting mask around others for 10 days after the end of isolation for the most recently infected person that lives with you. For example, if the last day of isolation for the person most recently infected with COVID-19 was June 30, the new 10-day period to wear a well-fitting mask indoors in public starts on July 1. °Isolate immediately if you develop symptoms of COVID-19 or test positive. °I had COVID-19 and completed isolation. Do I have to quarantine or get tested if someone I live with gets COVID-19 shortly after I completed isolation? °No. If you recently completed isolation and someone that lives with you tests positive for the virus that causes COVID-19 shortly after the end of your isolation period, you do not have to quarantine or get tested as long as you do not develop new symptoms. Once all of the people that live together have completed isolation or quarantine, refer to the guidance below for new exposures to COVID-19. °If you had COVID-19 in the previous 90 days and then came into close contact with someone with COVID-19, you do  not have to quarantine or get tested if you do not have symptoms. But you should: °Wear a well-fitting mask indoors in public for 10 days after your last close contact. °Monitor for COVID-19 symptoms for 10 days from the date of your last close contact. °Isolate immediately and get tested if symptoms develop. °If more than 90 days have passed since your recovery from infection, follow CDC's recommendations for close contacts. These recommendations will differ depending on your vaccination status. °10/19/2020 °Content source: National Center for Immunization and Respiratory Diseases (NCIRD), Division of Viral Diseases °This information is not intended to replace advice given to you by your health care provider. Make sure you discuss any questions you have with your health care provider. °Document Revised: 02/22/2021 Document Reviewed: 02/22/2021 °Elsevier Patient Education © 2022 Elsevier Inc. ° °

## 2021-07-10 NOTE — Progress Notes (Signed)
Virtual Visit  Note Due to COVID-19 pandemic this visit was conducted virtually. This visit type was conducted due to national recommendations for restrictions regarding the COVID-19 Pandemic (e.g. social distancing, sheltering in place) in an effort to limit this patient's exposure and mitigate transmission in our community. All issues noted in this document were discussed and addressed.  A physical exam was not performed with this format.  I connected with Christie Bruce on 07/10/21 at 8:07 by telephone and verified that I am speaking with the correct person using two identifiers. Christie Bruce is currently located at home and no one is currently with her during visit. The provider, Mary-Margaret Hassell Done, FNP is located in their office at time of visit.  I discussed the limitations, risks, security and privacy concerns of performing an evaluation and management service by telephone and the availability of in person appointments. I also discussed with the patient that there may be a patient responsible charge related to this service. The patient expressed understanding and agreed to proceed.   History and Present Illness:  Patient said she started getting sick on Friday evening. Slight cough, fatigue and congestion. No fever. Home covid test was positive. SHe says she actually feels better today. She says she [place] scared of new meds and is not sur ethat she wants to take anything.     Review of Systems  Constitutional:  Positive for malaise/fatigue. Negative for chills and fever.  HENT:  Positive for congestion. Negative for sore throat.   Respiratory:  Positive for cough and sputum production.   Musculoskeletal:  Negative for myalgias.  Neurological:  Negative for dizziness and headaches.    Observations/Objective: Alert and oriented- answers all questions appropriately No distress No cough  Assessment and Plan: Christie Bruce in today with chief complaint of No chief complaint on  file.   1. Lab test positive for detection of COVID-19 virus 1. Take meds as prescribed 2. Use a cool mist humidifier especially during the winter months and when heat has been humid. 3. Use saline nose sprays frequently 4. Saline irrigations of the nose can be very helpful if done frequently.  * 4X daily for 1 week*  * Use of a nettie pot can be helpful with this. Follow directions with this* 5. Drink plenty of fluids 6. Keep thermostat turn down low 7.For any cough or congestion- robbitussin 8. For fever or aces or pains- take tylenol or ibuprofen appropriate for age and weight.  * for fevers greater than 101 orally you may alternate ibuprofen and tylenol every  3 hours.   Patient refused antiviral    Follow Up Instructions: prn    I discussed the assessment and treatment plan with the patient. The patient was provided an opportunity to ask questions and all were answered. The patient agreed with the plan and demonstrated an understanding of the instructions.   The patient was advised to call back or seek an in-person evaluation if the symptoms worsen or if the condition fails to improve as anticipated.  The above assessment and management plan was discussed with the patient. The patient verbalized understanding of and has agreed to the management plan. Patient is aware to call the clinic if symptoms persist or worsen. Patient is aware when to return to the clinic for a follow-up visit. Patient educated on when it is appropriate to go to the emergency department.   Time call ended:  8:20  I provided 13 minutes of  non face-to-face  time during this encounter.    Mary-Margaret Hassell Done, FNP

## 2021-07-12 ENCOUNTER — Telehealth: Payer: Self-pay | Admitting: Family Medicine

## 2021-07-12 NOTE — Telephone Encounter (Signed)
Patients son aware 

## 2021-07-12 NOTE — Telephone Encounter (Signed)
Spoke with son, mom does not really want to take an antiviral but he had questions about it.  He feels that she first began having symptoms last week and based on the effectiveness of the medicine after that long and that his mom does not want to take the medication, he understands.  Patient is experiencing nasal congestion and burning in the nasal passages mainly.  She has only been taking Tylenol and not anything to treat her symptoms.  Patient's son was advised to treat symptoms with OTC medication and if symptoms worsen to let us know.

## 2021-07-20 ENCOUNTER — Ambulatory Visit (INDEPENDENT_AMBULATORY_CARE_PROVIDER_SITE_OTHER): Payer: Medicare Other | Admitting: *Deleted

## 2021-07-20 DIAGNOSIS — Z0000 Encounter for general adult medical examination without abnormal findings: Secondary | ICD-10-CM

## 2021-07-20 NOTE — Progress Notes (Signed)
MEDICARE ANNUAL WELLNESS VISIT  07/20/2021  Telephone Visit Disclaimer This Medicare AWV was conducted by telephone due to national recommendations for restrictions regarding the COVID-19 Pandemic (e.g. social distancing).  I verified, using two identifiers, that I am speaking with [name] or their authorized healthcare agent. I discussed the limitations, risks, security, and privacy concerns of performing an evaluation and management service by telephone and the potential availability of an in-person appointment in the future. The patient expressed understanding and agreed to proceed.  Location of Patient: home  Location of Provider (nurse):  office  Subjective:    Christie Bruce is a 80 y.o. female patient of Stacks, Cletus Gash, MD who had a Medicare Annual Wellness Visit today via telephone. [name] is Retired and lives alone but her kids check on her daily and one of them live next door. she has 3 children. she reports that she is socially active and does interact with friends/family regularly. she is moderately physically active and enjoys listening to music, going to see local bands, shopping, riding around with her friends and staying active in [place].  Patient Care Team: Claretta Fraise, MD as PCP - General (Family Medicine)  Advanced Directives 07/20/2021 02/08/2017 09/10/2016 02/19/2016 02/13/2016 02/10/2016 12/28/2015  Does Patient Have a Medical Advance Directive? No No No No No No No  Would patient like information on creating a medical advance directive? No - Patient declined - - No - patient declined information No - patient declined information No - patient declined information Yes - Spiritual care consult ordered    Hospital Utilization Over the Past 12 Months: # of hospitalizations or ER visits: 0 # of surgeries: 0  Review of Systems    Patient reports that her overall health is unchanged compared to last year.  History obtained from chart review and the  patient  Patient Reported Readings (BP, Pulse, CBG, Weight, etc) none  Pain Assessment Pain : No/denies pain     Current Medications & Allergies (verified) Allergies as of 07/20/2021       Reactions   Celebrex [celecoxib]    Hydrochlorothiazide    Lisinopril Swelling   Sulfa Antibiotics    Had sores in my mouth, bottom of my feet "the skin just came right off"        Medication List        Accurate as of July 20, 2021  3:33 PM. If you have any questions, ask your nurse or doctor.          STOP taking these medications    ZYRTEC PO       TAKE these medications    albuterol 108 (90 Base) MCG/ACT inhaler Commonly known as: VENTOLIN HFA Inhale 2 puffs into the lungs every 6 (six) hours as needed for wheezing or shortness of breath.   ALPRAZolam 0.25 MG tablet Commonly known as: XANAX Take 1 tablet (0.25 mg total) by mouth 2 (two) times daily.   amLODipine 5 MG tablet Commonly known as: NORVASC TAKE ONE (1) TABLET EACH DAY   colchicine 0.6 MG tablet TAKE 1 TABLET AT THE ONSET OF SYMPTOMS MAY REPEAT IN 2 HOURS IF NEEDED (MAX OF 2 TABLETS DAILY)   fexofenadine 60 MG tablet Commonly known as: ALLEGRA Take 1 tablet (60 mg total) by mouth 2 (two) times daily. For allergy and sinus   fluticasone 50 MCG/ACT nasal spray Commonly known as: FLONASE Place 2 sprays into both nostrils daily.   ipratropium-albuterol 0.5-2.5 (3) MG/3ML Soln Commonly  known as: DUONEB NEBULIZE 1 VIAL EVERY 6 HOURS AS NEEDED   levothyroxine 100 MCG tablet Commonly known as: SYNTHROID TAKE ONE TABLET EACH MORNING BEFORE BREAKFAST   omeprazole 40 MG capsule Commonly known as: PRILOSEC TAKE ONE (1) CAPSULE EACH DAY   Spacer/Aero Chamber Mouthpiece Misc 1 Units 4 (four) times daily by Does not apply route.   spironolactone 25 MG tablet Commonly known as: ALDACTONE Take 1 tablet (25 mg total) by mouth daily. 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   GERD (gastroesophageal reflux disease) 09/12/2011   Gout    Heart murmur    Hypertension    PONV (postoperative nausea and vomiting)    Thyroid disease    Uterine prolapse    Past Surgical History:  Procedure Laterality Date   bilateral mastectomy  07/19/10   bilat mastectomy for DCIS   CHOLECYSTECTOMY N/A 02/13/2016   Procedure: LAPAROSCOPIC CHOLECYSTECTOMY;  Surgeon: Aviva Signs, MD;  Location: [facility];  Service: General;  Laterality: N/A;   HIP SURGERY     INTRAMEDULLARY (IM) NAIL INTERTROCHANTERIC Left 12/28/2015   Procedure: INTRAMEDULLARY (IM) NAIL LEFT HIP;  Surgeon: Renette Butters, MD;  Location: [facility];  Service: Orthopedics;  Laterality: Left;   KNEE SURGERY Left    WRIST SURGERY Left    Family History  Problem Relation Age of Onset   Cancer Mother        melanoma   Stroke Mother    Cancer Father        prostate cancer   Heart disease Father    Hypertension Father    Cancer Brother        lung cancer   Hypertension Sister    Heart attack Brother    Diabetes Brother    Diabetes Brother    Cancer Paternal Uncle        lung cancer   Social History   Socioeconomic History   Marital status: Widowed    Spouse name: Not on file   Number of children: 3   Years of education: 9   Highest education level: 9th grade  Occupational History   Not on file  Tobacco Use   Smoking status: Former    Packs/day: 0.25    Years: 15.00    Pack years: 3.75    Types: Cigarettes    Quit date: 1997    Years since quitting: 26.0   Smokeless tobacco: Never  Vaping Use   Vaping Use: Never used  Substance and Sexual Activity   Alcohol use: No   Drug use: No   Sexual activity: Not Currently    Birth control/protection: Post-menopausal  Other Topics Concern   Not on file  Social History  Narrative   Not on file   Social Determinants of Health   Financial Resource Strain: Low Risk    Difficulty of Paying Living Expenses: Not hard at all  Food Insecurity: No Food Insecurity   Worried About Charity fundraiser in the Last Year: Never true   [place] in the Last Year: Never true  Transportation Needs: No Transportation Needs   Lack of Transportation (Medical): No   Lack of Transportation (Non-Medical): No  Physical Activity: Sufficiently Active  Days of Exercise per Week: 4 days   Minutes of Exercise per Session: 50 min  Stress: Stress Concern Present   Feeling of Stress : To some extent  Social Connections: Moderately Integrated   Frequency of Communication with Friends and Family: More than three times a week   Frequency of Social Gatherings with Friends and Family: More than three times a week   Attends Religious Services: More than 4 times per year   Active Member of [employer] or Organizations: Yes   Attends Archivist Meetings: More than 4 times per year   Marital Status: Widowed    Activities of Daily Living In your present state of health, do you have any difficulty performing the following activities: 07/20/2021  Hearing? N  Vision? Y  Comment needs to follow up with Eye Doctor-it's been over 2 years  Difficulty concentrating or making decisions? N  Walking or climbing stairs? Y  Comment due to her left leg/knee  Dressing or bathing? N  Doing errands, shopping? N  Preparing Food and eating ? N  Using the Toilet? N  In the past six months, have you accidently leaked urine? Y  Comment dribbles a little first thing in the morning  Do you have problems with loss of bowel control? N  Managing your Medications? N  Managing your Finances? N  Housekeeping or managing your Housekeeping? N  Some recent data might be hidden    Patient Education/ Literacy How often do you need to have someone help you when you read instructions, pamphlets, or  other written materials from your doctor or pharmacy?: 2 - Rarely What is the last grade level you completed in school?: 9th grade  Exercise Current Exercise Habits: Home exercise routine, Type of exercise: walking, Time (Minutes): 45, Frequency (Times/Week): 4, Weekly Exercise (Minutes/Week): 180, Intensity: Mild, Exercise limited by: orthopedic condition(s);respiratory conditions(s)  Diet Patient reports consuming 2 meals a day and 1 snack(s) a day Patient reports that her primary diet is: Regular Patient reports that she does have regular access to food.   Depression Screen PHQ 2/9 Scores 07/20/2021 02/01/2021 11/22/2020 09/29/2020 04/13/2020 10/03/2018 07/21/2018  PHQ - 2 Score 0 0 0 0 0 0 0  PHQ- 9 Score - - - - - - -     Fall Risk Fall Risk  07/20/2021 02/01/2021 11/22/2020 09/29/2020 04/29/2020  Falls in the past year? 0 0 1 0 0  Number falls in past yr: - - 0 - -  Injury with Fall? - - 0 - -  Comment - - - - -  Risk Factor Category  - - - - -  Risk for fall due to : - - History of fall(s) - -  Follow up - - - - -     Objective:  [name] seemed alert and oriented and she participated appropriately during our telephone visit.  Blood Pressure Weight BMI  BP Readings from Last 3 Encounters:  05/30/21 (!) 145/75  02/01/21 139/63  01/27/21 (!) 195/94   Wt Readings from Last 3 Encounters:  05/30/21 142 lb (64.4 kg)  02/01/21 138 lb 12.8 oz (63 kg)  01/27/21 140 lb (63.5 kg)   BMI Readings from Last 1 Encounters:  05/30/21 25.97 kg/m    *Unable to obtain current vital signs, weight, and BMI due to telephone visit type  Hearing/Vision  Deonne did not seem to have difficulty with hearing/understanding during the telephone conversation Reports that she has not had a formal eye  exam by an eye care professional within the past year Reports that she has not had a formal hearing evaluation within the past year *Unable to fully assess hearing and vision during telephone visit  type  Cognitive Function: 6CIT Screen 07/20/2021  What Year? 0 points  What month? 0 points  What time? 0 points  Count back from 20 0 points  Months in reverse 4 points  Repeat phrase 2 points  Total Score 6   (Normal:0-7, Significant for Dysfunction: >8)  Normal Cognitive Function Screening: Yes   Immunization & Health Maintenance Record Immunization History  Administered Date(s) Administered   Influenza, High Dose Seasonal PF 07/29/2017   Influenza,inj,Quad PF,6+ Mos 05/03/2014, 05/13/2015, 06/02/2018   Pneumococcal Conjugate-13 09/03/2014    Health Maintenance  Topic Date Due   TETANUS/TDAP  Never done   Zoster Vaccines- Shingrix (1 of 2) Never done   Pneumonia Vaccine 48+ Years old (2 - PPSV23 if available, else PCV20) 09/04/2015   INFLUENZA VACCINE  10/20/2021 (Originally 02/20/2021)   DEXA SCAN  07/20/2022 (Originally 09/22/2005)   COVID-19 Vaccine (1) 08/05/2022 (Originally 03/25/1941)   HPV VACCINES  Aged Out       Assessment  This is a routine wellness examination for [employer].  Health Maintenance: Due or Overdue Health Maintenance Due  Topic Date Due   TETANUS/TDAP  Never done   Zoster Vaccines- Shingrix (1 of 2) Never done   Pneumonia Vaccine 30+ Years old (2 - PPSV23 if available, else PCV20) 09/04/2015    [name] does not need a referral for Community Assistance: Care Management:   no Social Work:    no Prescription Assistance:  no Nutrition/Diabetes Education:  no   Plan:  Personalized Goals  Goals Addressed               This Visit's Progress     Patient Stated (pt-stated)        "I just want to stay healthy"       Personalized Health Maintenance & Screening Recommendations  Pneumococcal vaccine  Td vaccine Shingrix vaccine  Lung Cancer Screening Recommended: no (Low Dose CT Chest recommended if Age 47-80 years, 30 pack-year currently smoking OR have quit w/in past 15 years) Hepatitis C Screening recommended: no HIV  Screening recommended: no  Advanced Directives: Written information was not prepared per patient's request.  Referrals & Orders No orders of the defined types were placed in this encounter.   Follow-up Plan Follow-up with Claretta Fraise, MD as planned Schedule your eye exam with your Eye Doctor as discussed Consider Shingrix, Prevnar and TDAP vaccines at your next visit with your PCP   I have personally reviewed and noted the following in the patients chart:   Medical and social history Use of alcohol, tobacco or illicit drugs  Current medications and supplements Functional ability and status Nutritional status Physical activity Advanced directives List of other physicians Hospitalizations, surgeries, and ER visits in previous 12 months Vitals Screenings to include cognitive, depression, and falls Referrals and appointments  In addition, I have reviewed and discussed with [name] certain preventive protocols, quality metrics, and best practice recommendations. A written personalized care plan for preventive services as well as general preventive health recommendations is available and can be mailed to the patient at her request.      Milas Hock, LPN  [phone]

## 2021-07-26 ENCOUNTER — Ambulatory Visit (INDEPENDENT_AMBULATORY_CARE_PROVIDER_SITE_OTHER): Payer: Medicare Other

## 2021-07-26 ENCOUNTER — Encounter: Payer: Self-pay | Admitting: Family Medicine

## 2021-07-26 ENCOUNTER — Ambulatory Visit (INDEPENDENT_AMBULATORY_CARE_PROVIDER_SITE_OTHER): Payer: Medicare Other | Admitting: Family Medicine

## 2021-07-26 VITALS — BP 137/62 | HR 85 | Temp 98.3°F | Ht 62.0 in | Wt 143.6 lb

## 2021-07-26 DIAGNOSIS — Z780 Asymptomatic menopausal state: Secondary | ICD-10-CM | POA: Diagnosis not present

## 2021-07-26 DIAGNOSIS — M8580 Other specified disorders of bone density and structure, unspecified site: Secondary | ICD-10-CM

## 2021-07-26 DIAGNOSIS — N1832 Chronic kidney disease, stage 3b: Secondary | ICD-10-CM

## 2021-07-26 DIAGNOSIS — K219 Gastro-esophageal reflux disease without esophagitis: Secondary | ICD-10-CM | POA: Diagnosis not present

## 2021-07-26 DIAGNOSIS — I10 Essential (primary) hypertension: Secondary | ICD-10-CM

## 2021-07-26 DIAGNOSIS — F419 Anxiety disorder, unspecified: Secondary | ICD-10-CM | POA: Diagnosis not present

## 2021-07-26 DIAGNOSIS — M8588 Other specified disorders of bone density and structure, other site: Secondary | ICD-10-CM

## 2021-07-26 DIAGNOSIS — D649 Anemia, unspecified: Secondary | ICD-10-CM | POA: Diagnosis not present

## 2021-07-26 DIAGNOSIS — Z79891 Long term (current) use of opiate analgesic: Secondary | ICD-10-CM | POA: Diagnosis not present

## 2021-07-26 MED ORDER — LEVOTHYROXINE SODIUM 100 MCG PO TABS: ORAL_TABLET | ORAL | 5 refills | Status: DC

## 2021-07-26 MED ORDER — ALPRAZOLAM 0.25 MG PO TABS: 0.2500 mg | ORAL_TABLET | Freq: Two times a day (BID) | ORAL | 5 refills | Status: DC

## 2021-07-26 NOTE — Addendum Note (Signed)
Addended by: Ladean Raya on: 07/26/2021 03:38 PM   Modules accepted: Orders

## 2021-07-26 NOTE — Progress Notes (Signed)
Subjective:  Patient ID: Christie Bruce, female    DOB: Oct 01, 1940  Age: 81 y.o. MRN: [id number]  CC: Medical Management of Chronic Issues   HPI Christie Bruce presents for  follow-up of hypertension. Patient has no history of headache chest pain or shortness of breath or recent cough. Patient also denies symptoms of TIA such as focal numbness or weakness. Patient denies side effects from medication. States taking it regularly.  Sometimes gets anxious or worried. Taking xanax with satisfactory control. Long term use. Keeping dose to a minimum.    follow-up on  thyroid. The patient has a history of hypothyroidism for many years. It has been stable recently. Pt. denies any change in  voice, loss of hair, heat or cold intolerance. Energy level has been adequate to good. Patient denies constipation and diarrhea. No myxedema. Medication is as noted below. Verified that pt is taking it daily on an empty stomach. Well tolerated.  Patient in for follow-up of GERD. Currently asymptomatic taking  PPI daily. There is no chest pain or heartburn. No hematemesis and no melena. No dysphagia or choking. Onset is remote. Progression is stable. Complicating factors, none.     History Christie Bruce has a past medical history of Asthma, Breast cancer ([place]), Chronic kidney disease, DCIS (ductal carcinoma in situ) of breast (09/12/2011), GERD (gastroesophageal reflux disease) (09/12/2011), Gout, Heart murmur, Hypertension, PONV (postoperative nausea and vomiting), Thyroid disease, and Uterine prolapse.   She has a past surgical history that includes bilateral mastectomy (07/19/10); Knee surgery (Left); Wrist surgery (Left); Intramedullary (im) nail intertrochanteric (Left, 12/28/2015); Cholecystectomy (N/A, 02/13/2016); and Hip surgery.   Her family history includes Cancer in her brother, father, mother, and paternal uncle; Diabetes in her brother and brother; Heart attack in her brother; Heart disease in her father;  Hypertension in her father and sister; Stroke in her mother.She reports that she quit smoking about 26 years ago. Her smoking use included cigarettes. She has a 3.75 pack-year smoking history. She has never used smokeless tobacco. She reports that she does not drink alcohol and does not use drugs.  Current Outpatient Medications on File Prior to Visit  Medication Sig Dispense Refill   amLODipine (NORVASC) 5 MG tablet TAKE ONE (1) TABLET EACH DAY 90 tablet 0   colchicine 0.6 MG tablet TAKE 1 TABLET AT THE ONSET OF SYMPTOMS MAY REPEAT IN 2 HOURS IF NEEDED (MAX OF 2 TABLETS DAILY) 30 tablet 0   fexofenadine (ALLEGRA) 60 MG tablet Take 1 tablet (60 mg total) by mouth 2 (two) times daily. For allergy and sinus 180 tablet 3   fluticasone (FLONASE) 50 MCG/ACT nasal spray Place 2 sprays into both nostrils daily. 16 g 6   ipratropium-albuterol (DUONEB) 0.5-2.5 (3) MG/3ML SOLN NEBULIZE 1 VIAL EVERY 6 HOURS AS NEEDED 360 mL 6   omeprazole (PRILOSEC) 40 MG capsule TAKE ONE (1) CAPSULE EACH DAY 90 capsule 0   Spacer/Aero Chamber Mouthpiece MISC 1 Units 4 (four) times daily by Does not apply route. 1 each 0   spironolactone (ALDACTONE) 25 MG tablet Take 1 tablet (25 mg total) by mouth daily. For BP & swelling 90 tablet 3   albuterol (PROVENTIL HFA;VENTOLIN HFA) 108 (90 Base) MCG/ACT inhaler Inhale 2 puffs into the lungs every 6 (six) hours as needed for wheezing or shortness of breath. (Patient not taking: Reported on 07/26/2021) 1 Inhaler 0   No current facility-administered medications on file prior to visit.    ROS Review of Systems  Constitutional: Negative.  HENT: Negative.    Eyes:  Negative for visual disturbance.  Respiratory:  Negative for shortness of breath.   Cardiovascular:  Negative for chest pain.  Gastrointestinal:  Negative for abdominal pain.  Musculoskeletal:  Negative for arthralgias.   Objective:  BP 137/62    Pulse 85    Temp 98.3 F (36.8 C) (Temporal)    Ht _0  (1.575 m)    Wt  143 lb 9.6 oz (65.1 kg)    BMI 26.26 kg/m   BP Readings from Last 3 Encounters:  07/26/21 137/62  05/30/21 (!) 145/75  02/01/21 139/63    Wt Readings from Last 3 Encounters:  07/26/21 143 lb 9.6 oz (65.1 kg)  05/30/21 142 lb (64.4 kg)  02/01/21 138 lb 12.8 oz (63 kg)     Physical Exam Constitutional:      General: She is not in acute distress.    Appearance: She is well-developed.  Cardiovascular:     Rate and Rhythm: Normal rate and regular rhythm.  Pulmonary:     Breath sounds: Normal breath sounds.  Musculoskeletal:        General: Normal range of motion.  Skin:    General: Skin is warm and dry.  Neurological:     Mental Status: She is alert and oriented to person, place, and time.      Assessment & Plan:   Christie Bruce was seen today for medical management of chronic issues.  Diagnoses and all orders for this visit:  Anemia, unspecified type -     CMP14+EGFR -     TSH + free T4 -     CBC with Differential/Platelet  Anxiety -     ALPRAZolam (XANAX) 0.25 MG tablet; Take 1 tablet (0.25 mg total) by mouth 2 (two) times daily. -     CMP14+EGFR -     TSH + free T4 -     CBC with Differential/Platelet  Essential hypertension -     CMP14+EGFR -     TSH + free T4 -     CBC with Differential/Platelet  Gastroesophageal reflux disease without esophagitis -     CMP14+EGFR -     TSH + free T4 -     CBC with Differential/Platelet  Stage 3b chronic kidney disease (HCC) -     CMP14+EGFR -     TSH + free T4 -     CBC with Differential/Platelet  Osteopenia after menopause -     DG [facility] DEXA; Future  Other orders -     levothyroxine (SYNTHROID) 100 MCG tablet; TAKE ONE TABLET EACH MORNING BEFORE BREAKFAST Strength: 100 mcg   Allergies as of 07/26/2021       Reactions   Celebrex [celecoxib]    Hydrochlorothiazide    Lisinopril Swelling   Sulfa Antibiotics    Had sores in my mouth, bottom of my feet "the skin just came right off"        Medication List         Accurate as of July 26, 2021  2:24 PM. If you have any questions, ask your nurse or doctor.          albuterol 108 (90 Base) MCG/ACT inhaler Commonly known as: VENTOLIN HFA Inhale 2 puffs into the lungs every 6 (six) hours as needed for wheezing or shortness of breath.   ALPRAZolam 0.25 MG tablet Commonly known as: XANAX Take 1 tablet (0.25 mg total) by mouth 2 (two) times daily.   amLODipine 5 MG  tablet Commonly known as: NORVASC TAKE ONE (1) TABLET EACH DAY   colchicine 0.6 MG tablet TAKE 1 TABLET AT THE ONSET OF SYMPTOMS MAY REPEAT IN 2 HOURS IF NEEDED (MAX OF 2 TABLETS DAILY)   fexofenadine 60 MG tablet Commonly known as: ALLEGRA Take 1 tablet (60 mg total) by mouth 2 (two) times daily. For allergy and sinus   fluticasone 50 MCG/ACT nasal spray Commonly known as: FLONASE Place 2 sprays into both nostrils daily.   ipratropium-albuterol 0.5-2.5 (3) MG/3ML Soln Commonly known as: DUONEB NEBULIZE 1 VIAL EVERY 6 HOURS AS NEEDED   levothyroxine 100 MCG tablet Commonly known as: SYNTHROID TAKE ONE TABLET EACH MORNING BEFORE BREAKFAST Strength: 100 mcg What changed: See the new instructions. Changed by: Claretta Fraise, MD   omeprazole 40 MG capsule Commonly known as: PRILOSEC TAKE ONE (1) CAPSULE EACH DAY   Spacer/Aero Chamber Mouthpiece Misc 1 Units 4 (four) times daily by Does not apply route.   spironolactone 25 MG tablet Commonly known as: ALDACTONE Take 1 tablet (25 mg total) by mouth daily. For BP & swelling        Meds ordered this encounter  Medications   ALPRAZolam (XANAX) 0.25 MG tablet    Sig: Take 1 tablet (0.25 mg total) by mouth 2 (two) times daily.    Dispense:  60 tablet    Refill:  5   levothyroxine (SYNTHROID) 100 MCG tablet    Sig: TAKE ONE TABLET EACH MORNING BEFORE BREAKFAST Strength: 100 mcg    Dispense:  30 tablet    Refill:  5      Follow-up: Return in about 6 months (around 01/23/2022).  Claretta Fraise, M.D.

## 2021-07-27 DIAGNOSIS — Z780 Asymptomatic menopausal state: Secondary | ICD-10-CM | POA: Diagnosis not present

## 2021-07-27 DIAGNOSIS — M810 Age-related osteoporosis without current pathological fracture: Secondary | ICD-10-CM | POA: Diagnosis not present

## 2021-07-27 LAB — CMP14+EGFR
ALT: 10 IU/L (ref 0–32)
AST: 21 IU/L (ref 0–40)
Albumin/Globulin Ratio: 1.7 (ref 1.2–2.2)
Albumin: 4.7 g/dL (ref 3.7–4.7)
Alkaline Phosphatase: 126 IU/L — ABNORMAL HIGH (ref 44–121)
BUN/Creatinine Ratio: 12 (ref 12–28)
BUN: 14 mg/dL (ref 8–27)
Bilirubin Total: 0.7 mg/dL (ref 0.0–1.2)
CO2: 20 mmol/L (ref 20–29)
Calcium: 9.3 mg/dL (ref 8.7–10.3)
Chloride: 104 mmol/L (ref 96–106)
Creatinine, Ser: 1.17 mg/dL — ABNORMAL HIGH (ref 0.57–1.00)
Globulin, Total: 2.8 g/dL (ref 1.5–4.5)
Glucose: 90 mg/dL (ref 70–99)
Potassium: 3.9 mmol/L (ref 3.5–5.2)
Sodium: 142 mmol/L (ref 134–144)
Total Protein: 7.5 g/dL (ref 6.0–8.5)
eGFR: 47 mL/min/{1.73_m2} — ABNORMAL LOW (ref >59)

## 2021-07-27 LAB — CBC WITH DIFFERENTIAL/PLATELET
Basophils Absolute: 0 10*3/uL (ref 0.0–0.2)
Basos: 0 %
EOS (ABSOLUTE): 0.2 10*3/uL (ref 0.0–0.4)
Eos: 3 %
Hematocrit: 35.3 % (ref 34.0–46.6)
Hemoglobin: 11.5 g/dL (ref 11.1–15.9)
Immature Grans (Abs): 0 10*3/uL (ref 0.0–0.1)
Immature Granulocytes: 0 %
Lymphocytes Absolute: 3.2 10*3/uL — ABNORMAL HIGH (ref 0.7–3.1)
Lymphs: 43 %
MCH: 29.6 pg (ref 26.6–33.0)
MCHC: 32.6 g/dL (ref 31.5–35.7)
MCV: 91 fL (ref 79–97)
Monocytes Absolute: 0.5 10*3/uL (ref 0.1–0.9)
Monocytes: 7 %
Neutrophils Absolute: 3.5 10*3/uL (ref 1.4–7.0)
Neutrophils: 47 %
Platelets: 275 10*3/uL (ref 150–450)
RBC: 3.89 x10E6/uL (ref 3.77–5.28)
RDW: 13.1 % (ref 11.7–15.4)
WBC: 7.4 10*3/uL (ref 3.4–10.8)

## 2021-07-27 LAB — TSH+FREE T4
Free T4: 1.82 ng/dL — ABNORMAL HIGH (ref 0.82–1.77)
TSH: 0.378 u[IU]/mL — ABNORMAL LOW (ref 0.450–4.500)

## 2021-07-30 NOTE — Progress Notes (Signed)
DEXA shows osteopenia. I recommend weekly fosamax. Nurse, if pt. Is agreeable, send in Fosamax 70 mg weekly, #13. Cholesterol is too high. I recommend she take crestor to bring it down. If pt. Is agreeable send in crestor 10 mg, 1 daily #90, 1 refill   Thanks, WS

## 2021-07-31 ENCOUNTER — Other Ambulatory Visit: Payer: Self-pay | Admitting: *Deleted

## 2021-07-31 LAB — DRUG SCREEN 10 W/CONF, SERUM
Amphetamines, IA: NEGATIVE ng/mL
Barbiturates, IA: NEGATIVE ug/mL
Benzodiazepines, IA: NEGATIVE ng/mL
Cocaine & Metabolite, IA: NEGATIVE ng/mL
Methadone, IA: NEGATIVE ng/mL
Opiates, IA: NEGATIVE ng/mL
Oxycodones, IA: NEGATIVE ng/mL
Phencyclidine, IA: NEGATIVE ng/mL
Propoxyphene, IA: NEGATIVE ng/mL
THC(Marijuana) Metabolite, IA: NEGATIVE ng/mL

## 2021-07-31 MED ORDER — ROSUVASTATIN CALCIUM 10 MG PO TABS: 10.0000 mg | ORAL_TABLET | Freq: Every day | ORAL | 1 refills | Status: DC

## 2021-08-04 ENCOUNTER — Other Ambulatory Visit: Payer: Self-pay | Admitting: Family Medicine

## 2021-08-04 MED ORDER — LEVOTHYROXINE SODIUM 88 MCG PO TABS: 88.0000 ug | ORAL_TABLET | Freq: Every day | ORAL | 3 refills | Status: DC

## 2021-08-08 ENCOUNTER — Encounter: Payer: Self-pay | Admitting: Family Medicine

## 2021-08-08 ENCOUNTER — Ambulatory Visit: Payer: Medicare Other | Admitting: Family Medicine

## 2021-08-19 ENCOUNTER — Other Ambulatory Visit: Payer: Self-pay | Admitting: Family Medicine

## 2021-08-19 DIAGNOSIS — K219 Gastro-esophageal reflux disease without esophagitis: Secondary | ICD-10-CM

## 2021-08-22 ENCOUNTER — Other Ambulatory Visit: Payer: Self-pay | Admitting: Family Medicine

## 2021-08-30 ENCOUNTER — Encounter: Payer: Self-pay | Admitting: Family Medicine

## 2021-08-30 ENCOUNTER — Ambulatory Visit (INDEPENDENT_AMBULATORY_CARE_PROVIDER_SITE_OTHER): Payer: Medicare Other | Admitting: Family Medicine

## 2021-08-30 VITALS — BP 133/73 | HR 81 | Temp 97.5°F | Ht 62.0 in | Wt 140.0 lb

## 2021-08-30 DIAGNOSIS — L720 Epidermal cyst: Secondary | ICD-10-CM | POA: Diagnosis not present

## 2021-08-30 MED ORDER — DOXYCYCLINE HYCLATE 100 MG PO TABS: 100.0000 mg | ORAL_TABLET | Freq: Two times a day (BID) | ORAL | 0 refills | Status: AC

## 2021-08-30 NOTE — Progress Notes (Addendum)
Subjective:  Patient ID: Christie Bruce, female    DOB: Aug 14, 1940, 81 y.o.   MRN: [id number]  Patient Care Team: Claretta Fraise, MD as PCP - General (Family Medicine)   Chief Complaint:  Eye Problem (Little red bump on the r-cheek)   HPI: Christie Bruce is a 81 y.o. female presenting on 08/30/2021 for Eye Problem (Little red bump on the r-cheek)   Pt presents a nodule to the right cheek for 3 days. She endorses redness and tenderness. She has been icing the area. She says the knot does not feel has hard or tight as it did initially. She denies drainage/weeping.    Relevant past medical, surgical, family, and social history reviewed and updated as indicated.  Allergies and medications reviewed and updated. Data reviewed: Chart in Epic.   Past Medical History:  Diagnosis Date   Asthma    small issue and uses inhaler if needed   Breast cancer ([place])    Chronic kidney disease    DCIS (ductal carcinoma in situ) of breast 09/12/2011   GERD (gastroesophageal reflux disease) 09/12/2011   Gout    Heart murmur    Hypertension    PONV (postoperative nausea and vomiting)    Thyroid disease    Uterine prolapse     Past Surgical History:  Procedure Laterality Date   bilateral mastectomy  07/19/10   bilat mastectomy for DCIS   CHOLECYSTECTOMY N/A 02/13/2016   Procedure: LAPAROSCOPIC CHOLECYSTECTOMY;  Surgeon: Aviva Signs, MD;  Location: [facility];  Service: General;  Laterality: N/A;   HIP SURGERY     INTRAMEDULLARY (IM) NAIL INTERTROCHANTERIC Left 12/28/2015   Procedure: INTRAMEDULLARY (IM) NAIL LEFT HIP;  Surgeon: Renette Butters, MD;  Location: [facility];  Service: Orthopedics;  Laterality: Left;   KNEE SURGERY Left    WRIST SURGERY Left     Social History   Socioeconomic History   Marital status: Widowed    Spouse name: Not on file   Number of children: 3   Years of education: 9   Highest education level: 9th grade  Occupational History   Not on file  Tobacco Use   Smoking  status: Former    Packs/day: 0.25    Years: 15.00    Pack years: 3.75    Types: Cigarettes    Quit date: 1997    Years since quitting: 26.1   Smokeless tobacco: Never  Vaping Use   Vaping Use: Never used  Substance and Sexual Activity   Alcohol use: No   Drug use: No   Sexual activity: Not Currently    Birth control/protection: Post-menopausal  Other Topics Concern   Not on file  Social History Narrative   Not on file   Social Determinants of Health   Financial Resource Strain: Low Risk    Difficulty of Paying Living Expenses: Not hard at all  Food Insecurity: No Food Insecurity   Worried About Charity fundraiser in the Last Year: Never true   [place] in the Last Year: Never true  Transportation Needs: No Transportation Needs   Lack of Transportation (Medical): No   Lack of Transportation (Non-Medical): No  Physical Activity: Sufficiently Active   Days of Exercise per Week: 4 days   Minutes of Exercise per Session: 50 min  Stress: Stress Concern Present   Feeling of Stress : To some extent  Social Connections: Moderately Integrated   Frequency of Communication with Friends and Family: More than  three times a week   Frequency of Social Gatherings with Friends and Family: More than three times a week   Attends Religious Services: More than 4 times per year   Active Member of Clubs or Organizations: Yes   Attends Archivist Meetings: More than 4 times per year   Marital Status: Widowed  Intimate Partner Violence: Not At Risk   Fear of Current or Ex-Partner: No   Emotionally Abused: No   Physically Abused: No   Sexually Abused: No    Outpatient Encounter Medications as of 08/30/2021  Medication Sig   albuterol (PROVENTIL HFA;VENTOLIN HFA) 108 (90 Base) MCG/ACT inhaler Inhale 2 puffs into the lungs every 6 (six) hours as needed for wheezing or shortness of breath.   ALPRAZolam (XANAX) 0.25 MG tablet Take 1 tablet (0.25 mg total) by mouth 2 (two) times  daily.   amLODipine (NORVASC) 5 MG tablet TAKE ONE (1) TABLET EACH DAY   doxycycline (VIBRA-TABS) 100 MG tablet Take 1 tablet (100 mg total) by mouth 2 (two) times daily for 7 days.   fexofenadine (ALLEGRA) 60 MG tablet Take 1 tablet (60 mg total) by mouth 2 (two) times daily. For allergy and sinus   fluticasone (FLONASE) 50 MCG/ACT nasal spray Place 2 sprays into both nostrils daily.   ipratropium-albuterol (DUONEB) 0.5-2.5 (3) MG/3ML SOLN NEBULIZE 1 VIAL EVERY 6 HOURS AS NEEDED   levothyroxine (SYNTHROID) 88 MCG tablet Take 1 tablet (88 mcg total) by mouth daily before breakfast.   omeprazole (PRILOSEC) 40 MG capsule TAKE ONE (1) CAPSULE EACH DAY   rosuvastatin (CRESTOR) 10 MG tablet Take 1 tablet (10 mg total) by mouth daily.   Spacer/Aero Chamber Mouthpiece MISC 1 Units 4 (four) times daily by Does not apply route.   spironolactone (ALDACTONE) 25 MG tablet Take 1 tablet (25 mg total) by mouth daily. For BP & swelling   colchicine 0.6 MG tablet TAKE 1 TABLET AT THE ONSET OF SYMPTOMS MAY REPEAT IN 2 HOURS IF NEEDED (MAX OF 2 TABLETS DAILY) (Patient not taking: Reported on 08/30/2021)   No facility-administered encounter medications on file as of 08/30/2021.    Allergies  Allergen Reactions   Celebrex [Celecoxib]    Hydrochlorothiazide    Lisinopril Swelling   Sulfa Antibiotics     Had sores in my mouth, bottom of my feet "the skin just came right off"    Review of Systems  Constitutional:  Negative for activity change, appetite change, chills, diaphoresis, fatigue, fever and unexpected weight change.  Eyes:  Negative for photophobia and visual disturbance.  Respiratory:  Negative for shortness of breath.   Cardiovascular:  Negative for chest pain, palpitations and leg swelling.  Genitourinary:  Negative for decreased urine volume.  Skin:  Positive for color change and wound. Negative for pallor and rash.       Bump on right upper cheek  Neurological:  Negative for weakness and  headaches.  Psychiatric/Behavioral:  Negative for confusion.   All other systems reviewed and are negative.      Objective:  BP 133/73    Pulse 81    Temp (!) 97.5 F (36.4 C) (Temporal)    Ht 5' 2"  (1.575 m)    Wt 140 lb (63.5 kg)    BMI 25.61 kg/m    Wt Readings from Last 3 Encounters:  08/30/21 140 lb (63.5 kg)  07/26/21 143 lb 9.6 oz (65.1 kg)  05/30/21 142 lb (64.4 kg)    Physical Exam Vitals and nursing  note reviewed.  Constitutional:      General: She is not in acute distress.    Appearance: Normal appearance. She is not ill-appearing, toxic-appearing or diaphoretic.  HENT:     Head: Normocephalic and atraumatic.  Eyes:     Conjunctiva/sclera: Conjunctivae normal.     Pupils: Pupils are equal, round, and reactive to light.  Cardiovascular:     Rate and Rhythm: Normal rate and regular rhythm.     Pulses: Normal pulses.     Heart sounds: Normal heart sounds.  Pulmonary:     Effort: Pulmonary effort is normal.     Breath sounds: Normal breath sounds.  Musculoskeletal:        General: Normal range of motion.  Skin:    General: Skin is warm and dry.     Capillary Refill: Capillary refill takes less than 2 seconds.     Findings: Lesion present.       Neurological:     General: No focal deficit present.     Mental Status: She is alert and oriented to person, place, and time.     Gait: Gait abnormal (uses cane).  Psychiatric:        Mood and Affect: Mood normal.        Behavior: Behavior normal.        Thought Content: Thought content normal.        Judgment: Judgment normal.    Results for orders placed or performed in visit on 07/26/21  CMP14+EGFR  Result Value Ref Range   Glucose 90 70 - 99 mg/dL   BUN 14 8 - 27 mg/dL   Creatinine, Ser 1.17 (H) 0.57 - 1.00 mg/dL   eGFR 47 (L) >59 mL/min/1.73   BUN/Creatinine Ratio 12 12 - 28   Sodium 142 134 - 144 mmol/L   Potassium 3.9 3.5 - 5.2 mmol/L   Chloride 104 96 - 106 mmol/L   CO2 20 20 - 29 mmol/L   Calcium  9.3 8.7 - 10.3 mg/dL   Total Protein 7.5 6.0 - 8.5 g/dL   Albumin 4.7 3.7 - 4.7 g/dL   Globulin, Total 2.8 1.5 - 4.5 g/dL   Albumin/Globulin Ratio 1.7 1.2 - 2.2   Bilirubin Total 0.7 0.0 - 1.2 mg/dL   Alkaline Phosphatase 126 (H) 44 - 121 IU/L   AST 21 0 - 40 IU/L   ALT 10 0 - 32 IU/L  TSH + free T4  Result Value Ref Range   TSH 0.378 (L) 0.450 - 4.500 uIU/mL   Free T4 1.82 (H) 0.82 - 1.77 ng/dL  CBC with Differential/Platelet  Result Value Ref Range   WBC 7.4 3.4 - 10.8 x10E3/uL   RBC 3.89 3.77 - 5.28 x10E6/uL   Hemoglobin 11.5 11.1 - 15.9 g/dL   Hematocrit 35.3 34.0 - 46.6 %   MCV 91 79 - 97 fL   MCH 29.6 26.6 - 33.0 pg   MCHC 32.6 31.5 - 35.7 g/dL   RDW 13.1 11.7 - 15.4 %   Platelets 275 150 - 450 x10E3/uL   Neutrophils 47 Not Estab. %   Lymphs 43 Not Estab. %   Monocytes 7 Not Estab. %   Eos 3 Not Estab. %   Basos 0 Not Estab. %   Neutrophils Absolute 3.5 1.4 - 7.0 x10E3/uL   Lymphocytes Absolute 3.2 (H) 0.7 - 3.1 x10E3/uL   Monocytes Absolute 0.5 0.1 - 0.9 x10E3/uL   EOS (ABSOLUTE) 0.2 0.0 - 0.4 x10E3/uL   Basophils Absolute  0.0 0.0 - 0.2 x10E3/uL   Immature Granulocytes 0 Not Estab. %   Immature Grans (Abs) 0.0 0.0 - 0.1 x10E3/uL  Drug Screen 10 W/Conf, Se  Result Value Ref Range   Amphetamines, IA Negative Cutoff:50 ng/mL   Barbiturates, IA Negative Cutoff:0.1 ug/mL   Benzodiazepines, IA Negative Cutoff:20 ng/mL   Cocaine & Metabolite, IA Negative Cutoff:25 ng/mL   Phencyclidine, IA Negative Cutoff:8 ng/mL   THC(Marijuana) Metabolite, IA Negative Cutoff:5 ng/mL   Opiates, IA Negative Cutoff:5 ng/mL   Oxycodones, IA Negative Cutoff:5 ng/mL   Methadone, IA Negative Cutoff:25 ng/mL   Propoxyphene, IA Negative Cutoff:50 ng/mL       Pertinent labs & imaging results that were available during my care of the patient were reviewed by me and considered in my medical decision making.  Assessment & Plan:  Cristina was seen today for eye problem.  Diagnoses and all  orders for this visit:  Epidermoid cyst of skin of cheek -     doxycycline (VIBRA-TABS) 100 MG tablet; Take 1 tablet (100 mg total) by mouth 2 (two) times daily for 7 days.     Continue all other maintenance medications.  [place] was seen today for eye problem.  Diagnoses and all orders for this visit:  Epidermoid cyst of skin of cheek -     doxycycline (VIBRA-TABS) 100 MG tablet; Take 1 tablet (100 mg total) by mouth 2 (two) times daily for 7 days. - Instructed to use warm compresses and antibiotic therapy. Avoid picking skin. Follow up if symptoms do not improve with antibiotics.   Follow up plan: Return in 1 week (on 09/06/2021), or if symptoms worsen or fail to improve.   Continue healthy lifestyle choices, including diet (rich in fruits, vegetables, and lean proteins, and low in salt and simple carbohydrates) and exercise (at least 30 minutes of moderate physical activity daily).  Educational handout given for epidermoid cyst.   The above assessment and management plan was discussed with the patient. The patient verbalized understanding of and has agreed to the management plan. Patient is aware to call the clinic if they develop any new symptoms or if symptoms persist or worsen. Patient is aware when to return to the clinic for a follow-up visit. Patient educated on when it is appropriate to go to the emergency department.   Deidre Ala, NP-S  I personally was present during the history, physical exam, and medical decision-making activities of this visit and have verified that the services and findings are accurately documented in the nurse practitioner student's note.  Monia Pouch, FNP-C [facility] [address] [place], [place] [address] [phone]

## 2021-10-06 ENCOUNTER — Other Ambulatory Visit: Payer: Self-pay | Admitting: Family Medicine

## 2021-10-06 DIAGNOSIS — I10 Essential (primary) hypertension: Secondary | ICD-10-CM

## 2021-10-17 ENCOUNTER — Other Ambulatory Visit: Payer: Self-pay

## 2021-10-17 ENCOUNTER — Encounter: Payer: Self-pay | Admitting: Obstetrics & Gynecology

## 2021-10-17 ENCOUNTER — Ambulatory Visit: Payer: Medicare Other | Admitting: Obstetrics & Gynecology

## 2021-10-17 VITALS — BP 152/71 | HR 78 | Ht 62.0 in | Wt 146.0 lb

## 2021-10-17 DIAGNOSIS — N813 Complete uterovaginal prolapse: Secondary | ICD-10-CM

## 2021-10-17 DIAGNOSIS — Z4689 Encounter for fitting and adjustment of other specified devices: Secondary | ICD-10-CM

## 2021-10-17 NOTE — Progress Notes (Signed)
Chief Complaint  ?Patient presents with  ? Pessary Check  ? ? ?Blood pressure (!) 152/71, pulse 78, height '5\' 2"'$  (1.575 m), weight 146 lb (66.2 kg). ? ?Christie Bruce presents today for routine follow up related to her pessary.   ?She uses a Milex ring with support #2 ?She reports no vaginal discharge and no vaginal bleeding  ? ?Likert scale(1 not bothersome -5 very bothersome)  :  1 ? ?Exam reveals no undue vaginal mucosal pressure of breakdown, no discharge and no vaginal bleeding. ? ?Vaginal Epithelial Abnormality Classification System:   0 ?0    No abnormalities ?1    Epithelial erythema ?2    Granulation tissue ?3    Epithelial break or erosion, 1 cm or less ?4    Epithelial break or erosion, 1 cm or greater ? ?The pessary is removed, cleaned and replaced without difficulty.   ? ?  ICD-10-CM   ?1. Pessary maintenance, Milex ring with support #2  Z46.89   ?  ?2. Uterovaginal prolapse, complete  N81.3   ?  ?  ? ?Christie Bruce will be sen back in 4 months for continued follow up. ? ?Florian Buff, MD  ?10/17/2021 ?10:11 AM ? ? ? ?

## 2021-11-17 ENCOUNTER — Other Ambulatory Visit: Payer: Self-pay | Admitting: Family Medicine

## 2021-11-17 DIAGNOSIS — I10 Essential (primary) hypertension: Secondary | ICD-10-CM

## 2021-12-25 ENCOUNTER — Other Ambulatory Visit: Payer: Self-pay | Admitting: Nurse Practitioner

## 2021-12-25 DIAGNOSIS — J301 Allergic rhinitis due to pollen: Secondary | ICD-10-CM

## 2021-12-27 ENCOUNTER — Encounter: Payer: Self-pay | Admitting: Obstetrics & Gynecology

## 2022-01-02 ENCOUNTER — Other Ambulatory Visit: Payer: Self-pay | Admitting: Family Medicine

## 2022-01-02 DIAGNOSIS — I10 Essential (primary) hypertension: Secondary | ICD-10-CM

## 2022-02-16 ENCOUNTER — Ambulatory Visit: Payer: Medicare Other | Admitting: Obstetrics & Gynecology

## 2022-02-19 ENCOUNTER — Ambulatory Visit: Payer: Medicare Other | Admitting: Obstetrics & Gynecology

## 2022-02-19 ENCOUNTER — Encounter: Payer: Self-pay | Admitting: Obstetrics & Gynecology

## 2022-02-19 VITALS — BP 152/67 | HR 73 | Ht 62.0 in | Wt 144.0 lb

## 2022-02-19 DIAGNOSIS — N813 Complete uterovaginal prolapse: Secondary | ICD-10-CM | POA: Diagnosis not present

## 2022-02-19 DIAGNOSIS — Z4689 Encounter for fitting and adjustment of other specified devices: Secondary | ICD-10-CM

## 2022-02-19 NOTE — Progress Notes (Signed)
Chief Complaint  Patient presents with   Pessary Check    Blood pressure (!) 152/67, pulse 73, height '5\' 2"'$  (1.575 m), weight 144 lb (65.3 kg).  Christie Bruce presents today for routine follow up related to her pessary.   She uses a Milex ring with support #2 She reports no vaginal discharge and no vaginal bleeding   Likert scale(1 not bothersome -5 very bothersome)  :  1  Exam reveals no undue vaginal mucosal pressure of breakdown, no discharge and no vaginal bleeding.  Vaginal Epithelial Abnormality Classification System:   0 0    No abnormalities 1    Epithelial erythema 2    Granulation tissue 3    Epithelial break or erosion, 1 cm or less 4    Epithelial break or erosion, 1 cm or greater  The pessary is removed, cleaned and replaced without difficulty.      ICD-10-CM   1. Pessary maintenance, Milex ring with support #2  Z46.89     2. Uterovaginal prolapse, complete  N81.3        Christie Bruce will be sen back in 4 months for continued follow up.  Florian Buff, MD  02/19/2022 8:54 AM

## 2022-02-21 ENCOUNTER — Other Ambulatory Visit: Payer: Self-pay | Admitting: Family Medicine

## 2022-02-21 DIAGNOSIS — K219 Gastro-esophageal reflux disease without esophagitis: Secondary | ICD-10-CM

## 2022-02-24 ENCOUNTER — Other Ambulatory Visit: Payer: Self-pay | Admitting: Family Medicine

## 2022-02-27 ENCOUNTER — Other Ambulatory Visit: Payer: Self-pay | Admitting: Family Medicine

## 2022-03-08 ENCOUNTER — Other Ambulatory Visit: Payer: Self-pay | Admitting: Family Medicine

## 2022-03-08 DIAGNOSIS — F419 Anxiety disorder, unspecified: Secondary | ICD-10-CM

## 2022-03-13 ENCOUNTER — Telehealth: Payer: Self-pay | Admitting: Family Medicine

## 2022-03-13 NOTE — Telephone Encounter (Signed)
Appointment scheduled for tomorrow.

## 2022-03-14 ENCOUNTER — Encounter: Payer: Self-pay | Admitting: Family Medicine

## 2022-03-14 ENCOUNTER — Ambulatory Visit (INDEPENDENT_AMBULATORY_CARE_PROVIDER_SITE_OTHER): Payer: Medicare Other | Admitting: Family Medicine

## 2022-03-14 VITALS — BP 135/67 | HR 85 | Temp 97.7°F | Wt 141.2 lb

## 2022-03-14 DIAGNOSIS — D649 Anemia, unspecified: Secondary | ICD-10-CM

## 2022-03-14 DIAGNOSIS — K219 Gastro-esophageal reflux disease without esophagitis: Secondary | ICD-10-CM

## 2022-03-14 DIAGNOSIS — Z13220 Encounter for screening for lipoid disorders: Secondary | ICD-10-CM

## 2022-03-14 DIAGNOSIS — F419 Anxiety disorder, unspecified: Secondary | ICD-10-CM

## 2022-03-14 DIAGNOSIS — I10 Essential (primary) hypertension: Secondary | ICD-10-CM | POA: Diagnosis not present

## 2022-03-14 DIAGNOSIS — E039 Hypothyroidism, unspecified: Secondary | ICD-10-CM | POA: Diagnosis not present

## 2022-03-14 MED ORDER — AMLODIPINE BESYLATE 5 MG PO TABS: ORAL_TABLET | ORAL | 3 refills | Status: DC

## 2022-03-14 MED ORDER — ROSUVASTATIN CALCIUM 10 MG PO TABS: 10.0000 mg | ORAL_TABLET | Freq: Every day | ORAL | 3 refills | Status: DC

## 2022-03-14 MED ORDER — OMEPRAZOLE 40 MG PO CPDR: DELAYED_RELEASE_CAPSULE | ORAL | 3 refills | Status: DC

## 2022-03-14 MED ORDER — FEXOFENADINE HCL 60 MG PO TABS
60.0000 mg | ORAL_TABLET | Freq: Two times a day (BID) | ORAL | 3 refills | Status: DC
Start: 2022-03-14 — End: 2023-06-03

## 2022-03-14 MED ORDER — SPIRONOLACTONE 25 MG PO TABS
25.0000 mg | ORAL_TABLET | Freq: Every day | ORAL | 3 refills | Status: DC
Start: 2022-03-14 — End: 2022-12-28

## 2022-03-14 MED ORDER — ALPRAZOLAM 0.25 MG PO TABS: 0.2500 mg | ORAL_TABLET | Freq: Two times a day (BID) | ORAL | 5 refills | Status: DC

## 2022-03-14 NOTE — Progress Notes (Signed)
Subjective:  Patient ID: Christie Bruce,  female    DOB: 04-15-1941  Age: 81 y.o.    CC: Medical Management of Chronic Issues   HPI Christie Bruce presents for  follow-up of hypertension. Patient has no history of headache chest pain or shortness of breath or recent cough. Patient also denies symptoms of TIA such as numbness weakness lateralizing. Patient denies side effects from medication. States taking it regularly.  Patient also  in for follow-up of elevated cholesterol. Crestor made Christie Bruce feel tired and funny. Denies myalgia and arthralgia and nausea. Currently no chest pain, shortness of breath or other cardiovascular related symptoms noted.   follow-up on  thyroid. The patient has a history of hypothyroidism for many years. It has been stable recently. Pt. denies any change in  voice, loss of hair, heat or cold intolerance. Energy level has been adequate to good. Patient denies constipation and diarrhea. No myxedema. Medication is as noted below. Verified that pt is taking it daily on an empty stomach. Well tolerated.  Using xanax for nerves.     03/14/2022    3:48 PM  GAD 7 : Generalized Anxiety Score  Nervous, Anxious, on Edge 1  Control/stop worrying 1  Worry too much - different things 0  Trouble relaxing 0  Restless 0  Easily annoyed or irritable 0  Afraid - awful might happen 0  Total GAD 7 Score 2  Anxiety Difficulty Not difficult at all       History Christie Bruce has a past medical history of Asthma, Breast cancer ([place]), Chronic kidney disease, DCIS (ductal carcinoma in situ) of breast (09/12/2011), GERD (gastroesophageal reflux disease) (09/12/2011), Gout, Heart murmur, Hypertension, PONV (postoperative nausea and vomiting), Thyroid disease, and Uterine prolapse.   Christie Bruce has a past surgical history that includes bilateral mastectomy (07/19/10); Knee surgery (Left); Wrist surgery (Left); Intramedullary (im) nail intertrochanteric (Left, 12/28/2015); Cholecystectomy (N/A,  02/13/2016); and Hip surgery.   Christie Bruce family history includes Cancer in Christie Bruce brother, father, mother, and paternal uncle; Diabetes in Christie Bruce brother and brother; Heart attack in Christie Bruce brother; Heart disease in Christie Bruce father; Hypertension in Christie Bruce father and sister; Stroke in Christie Bruce mother.Christie Bruce reports that Christie Bruce quit smoking about 26 years ago. Christie Bruce smoking use included cigarettes. Christie Bruce has a 3.75 pack-year smoking history. Christie Bruce has never used smokeless tobacco. Christie Bruce reports that Christie Bruce does not drink alcohol and does not use drugs.  Current Outpatient Medications on File Prior to Visit  Medication Sig Dispense Refill   albuterol (PROVENTIL HFA;VENTOLIN HFA) 108 (90 Base) MCG/ACT inhaler Inhale 2 puffs into the lungs every 6 (six) hours as needed for wheezing or shortness of breath. 1 Inhaler 0   colchicine 0.6 MG tablet TAKE 1 TABLET AT THE ONSET OF SYMPTOMS MAY REPEAT IN 2 HOURS IF NEEDED (MAX OF 2 TABLETS DAILY) 30 tablet 0   fluticasone (FLONASE) 50 MCG/ACT nasal spray USE 2 SPRAYS IN EACH NOSTRIL DAILY 16 g 5   ipratropium-albuterol (DUONEB) 0.5-2.5 (3) MG/3ML SOLN NEBULIZE 1 VIAL EVERY 6 HOURS AS NEEDED 360 mL 6   levothyroxine (SYNTHROID) 88 MCG tablet Take 1 tablet (88 mcg total) by mouth daily before breakfast. 90 tablet 3   Spacer/Aero Chamber Mouthpiece MISC 1 Units 4 (four) times daily by Does not apply route. 1 each 0   No current facility-administered medications on file prior to visit.    ROS Review of Systems  Constitutional: Negative.   HENT: Negative.    Eyes:  Negative for visual disturbance.  Respiratory:  Negative for shortness of breath.   Cardiovascular:  Negative for chest pain.  Gastrointestinal:  Negative for abdominal pain.  Musculoskeletal:  Negative for arthralgias.    Objective:  BP 135/67   Pulse 85   Temp 97.7 F (36.5 C)   Wt 141 lb 3.2 oz (64 kg)   SpO2 97%   BMI 25.83 kg/m   BP Readings from Last 3 Encounters:  03/14/22 135/67  02/19/22 (!) 152/67  10/17/21 (!) 152/71     Wt Readings from Last 3 Encounters:  03/14/22 141 lb 3.2 oz (64 kg)  02/19/22 144 lb (65.3 kg)  10/17/21 146 lb (66.2 kg)     Physical Exam Constitutional:      General: Christie Bruce is not in acute distress.    Appearance: Christie Bruce is well-developed.  Cardiovascular:     Rate and Rhythm: Normal rate and regular rhythm.  Pulmonary:     Breath sounds: Normal breath sounds.  Musculoskeletal:        General: Normal range of motion.  Skin:    General: Skin is warm and dry.  Neurological:     Mental Status: Christie Bruce is alert and oriented to person, place, and time.     Diabetic Foot Exam - Simple   No data filed     Lab Results  Component Value Date   HGBA1C 5.4 02/07/2016    Assessment & Plan:   Christie Bruce was seen today for medical management of chronic issues.  Diagnoses and all orders for this visit:  Essential hypertension -     CBC with Differential/Platelet -     CMP14+EGFR -     amLODipine (NORVASC) 5 MG tablet; TAKE ONE (1) TABLET EACH DAY  Hypothyroidism, unspecified type -     TSH + free T4  Anemia, unspecified type -     CBC with Differential/Platelet  Lipid screening -     Lipid panel  Anxiety -     ALPRAZolam (XANAX) 0.25 MG tablet; Take 1 tablet (0.25 mg total) by mouth 2 (two) times daily.  Gastroesophageal reflux disease without esophagitis -     omeprazole (PRILOSEC) 40 MG capsule; TAKE ONE (1) CAPSULE EACH DAY  Other orders -     fexofenadine (ALLEGRA) 60 MG tablet; Take 1 tablet (60 mg total) by mouth 2 (two) times daily. For allergy and sinus -     rosuvastatin (CRESTOR) 10 MG tablet; Take 1 tablet (10 mg total) by mouth daily. -     spironolactone (ALDACTONE) 25 MG tablet; Take 1 tablet (25 mg total) by mouth daily.   I have changed Christie Bruce amLODipine, omeprazole, and spironolactone. I am also having Christie Bruce maintain Christie Bruce [place], albuterol, ipratropium-albuterol, colchicine, levothyroxine, fluticasone, ALPRAZolam,  fexofenadine, and rosuvastatin.  Meds ordered this encounter  Medications   ALPRAZolam (XANAX) 0.25 MG tablet    Sig: Take 1 tablet (0.25 mg total) by mouth 2 (two) times daily.    Dispense:  60 tablet    Refill:  5   amLODipine (NORVASC) 5 MG tablet    Sig: TAKE ONE (1) TABLET EACH DAY    Dispense:  90 tablet    Refill:  3   fexofenadine (ALLEGRA) 60 MG tablet    Sig: Take 1 tablet (60 mg total) by mouth 2 (two) times daily. For allergy and sinus    Dispense:  180 tablet    Refill:  3   omeprazole (PRILOSEC) 40 MG capsule    Sig: TAKE  ONE (1) CAPSULE EACH DAY    Dispense:  90 capsule    Refill:  3   rosuvastatin (CRESTOR) 10 MG tablet    Sig: Take 1 tablet (10 mg total) by mouth daily.    Dispense:  90 tablet    Refill:  3   spironolactone (ALDACTONE) 25 MG tablet    Sig: Take 1 tablet (25 mg total) by mouth daily.    Dispense:  90 tablet    Refill:  3     Follow-up: Return in about 6 months (around 09/14/2022).  Claretta Fraise, M.D.

## 2022-03-15 LAB — CBC WITH DIFFERENTIAL/PLATELET
Basophils Absolute: 0 10*3/uL (ref 0.0–0.2)
Basos: 0 %
EOS (ABSOLUTE): 0.2 10*3/uL (ref 0.0–0.4)
Eos: 3 %
Hematocrit: 34 % (ref 34.0–46.6)
Hemoglobin: 11.1 g/dL (ref 11.1–15.9)
Immature Grans (Abs): 0 10*3/uL (ref 0.0–0.1)
Immature Granulocytes: 0 %
Lymphocytes Absolute: 2.1 10*3/uL (ref 0.7–3.1)
Lymphs: 36 %
MCH: 29.1 pg (ref 26.6–33.0)
MCHC: 32.6 g/dL (ref 31.5–35.7)
MCV: 89 fL (ref 79–97)
Monocytes Absolute: 0.4 10*3/uL (ref 0.1–0.9)
Monocytes: 8 %
Neutrophils Absolute: 3.1 10*3/uL (ref 1.4–7.0)
Neutrophils: 53 %
Platelets: 260 10*3/uL (ref 150–450)
RBC: 3.81 x10E6/uL (ref 3.77–5.28)
RDW: 13 % (ref 11.7–15.4)
WBC: 5.9 10*3/uL (ref 3.4–10.8)

## 2022-03-15 LAB — CMP14+EGFR
ALT: 10 IU/L (ref 0–32)
AST: 24 IU/L (ref 0–40)
Albumin/Globulin Ratio: 1.7 (ref 1.2–2.2)
Albumin: 4.6 g/dL (ref 3.7–4.7)
Alkaline Phosphatase: 119 IU/L (ref 44–121)
BUN/Creatinine Ratio: 17 (ref 12–28)
BUN: 21 mg/dL (ref 8–27)
Bilirubin Total: 0.5 mg/dL (ref 0.0–1.2)
CO2: 18 mmol/L — ABNORMAL LOW (ref 20–29)
Calcium: 9.7 mg/dL (ref 8.7–10.3)
Chloride: 106 mmol/L (ref 96–106)
Creatinine, Ser: 1.23 mg/dL — ABNORMAL HIGH (ref 0.57–1.00)
Globulin, Total: 2.7 g/dL (ref 1.5–4.5)
Glucose: 127 mg/dL — ABNORMAL HIGH (ref 70–99)
Potassium: 4.4 mmol/L (ref 3.5–5.2)
Sodium: 141 mmol/L (ref 134–144)
Total Protein: 7.3 g/dL (ref 6.0–8.5)
eGFR: 44 mL/min/{1.73_m2} — ABNORMAL LOW (ref >59)

## 2022-03-15 LAB — LIPID PANEL
Chol/HDL Ratio: 2.7 ratio (ref 0.0–4.4)
Cholesterol, Total: 161 mg/dL (ref 100–199)
HDL: 59 mg/dL (ref >39)
LDL Chol Calc (NIH): 86 mg/dL (ref 0–99)
Triglycerides: 88 mg/dL (ref 0–149)
VLDL Cholesterol Cal: 16 mg/dL (ref 5–40)

## 2022-03-15 LAB — TSH+FREE T4
Free T4: 1.51 ng/dL (ref 0.82–1.77)
TSH: 0.745 u[IU]/mL (ref 0.450–4.500)

## 2022-03-15 NOTE — Progress Notes (Signed)
Hello Christie Bruce,  Your lab result is normal and/or stable.Some minor variations that are not significant are commonly marked abnormal, but do not represent any medical problem for you.  Best regards, Bristyl Mclees, M.D.

## 2022-04-30 ENCOUNTER — Ambulatory Visit (INDEPENDENT_AMBULATORY_CARE_PROVIDER_SITE_OTHER): Payer: Medicare Other | Admitting: Family Medicine

## 2022-04-30 ENCOUNTER — Encounter: Payer: Self-pay | Admitting: Family Medicine

## 2022-04-30 DIAGNOSIS — J329 Chronic sinusitis, unspecified: Secondary | ICD-10-CM | POA: Diagnosis not present

## 2022-04-30 DIAGNOSIS — J40 Bronchitis, not specified as acute or chronic: Secondary | ICD-10-CM

## 2022-04-30 MED ORDER — AMOXICILLIN-POT CLAVULANATE 875-125 MG PO TABS: 1.0000 | ORAL_TABLET | Freq: Two times a day (BID) | ORAL | 0 refills | Status: DC

## 2022-04-30 MED ORDER — BENZONATATE 200 MG PO CAPS: 200.0000 mg | ORAL_CAPSULE | Freq: Three times a day (TID) | ORAL | 0 refills | Status: DC | PRN

## 2022-04-30 NOTE — Progress Notes (Signed)
Subjective:    Patient ID: Christie Bruce, female    DOB: Feb 07, 1941, 81 y.o.   MRN: [id number]   HPI: BERNEDETTE AUSTON is a 81 y.o. female presenting for onset 3 weeks ago started with wheezing. Nose runny, eyes watery, sinus drainage. Chest congestion followed. Still coughing with clear sputum. Denies dyspnea. No fever. Sinuses still draining.       03/14/2022    3:47 PM 08/30/2021   12:41 PM 07/20/2021    3:18 PM 02/01/2021    9:52 AM 11/22/2020    4:04 PM  Depression screen PHQ 2/9  Decreased Interest 0 0 0 0 0  Down, Depressed, Hopeless 0 0 0 0 0  PHQ - 2 Score 0 0 0 0 0  Altered sleeping  0     Tired, decreased energy  0     Change in appetite  0     Feeling bad or failure about yourself   0     Trouble concentrating  0     Moving slowly or fidgety/restless  0     Suicidal thoughts  0     PHQ-9 Score  0        Relevant past medical, surgical, family and social history reviewed and updated as indicated.  Interim medical history since our last visit reviewed. Allergies and medications reviewed and updated.  ROS:  Review of Systems  Constitutional:  Negative for activity change, appetite change, chills and fever.  HENT:  Positive for congestion, postnasal drip, rhinorrhea and sinus pressure. Negative for ear discharge, ear pain, hearing loss, nosebleeds, sneezing and trouble swallowing.   Respiratory:  Negative for chest tightness and shortness of breath.   Cardiovascular:  Negative for chest pain and palpitations.  Skin:  Negative for rash.     Social History   Tobacco Use  Smoking Status Former   Packs/day: 0.25   Years: 15.00   Total pack years: 3.75   Types: Cigarettes   Quit date: 1997   Years since quitting: 26.7  Smokeless Tobacco Never       Objective:     Wt Readings from Last 3 Encounters:  03/14/22 141 lb 3.2 oz (64 kg)  02/19/22 144 lb (65.3 kg)  10/17/21 146 lb (66.2 kg)     Exam deferred. Pt. [place] due to COVID 19. Phone visit  performed.   Assessment & Plan:   1. Sinobronchitis     Meds ordered this encounter  Medications   amoxicillin-clavulanate (AUGMENTIN) 875-125 MG tablet    Sig: Take 1 tablet by mouth 2 (two) times daily. Take all of this medication    Dispense:  20 tablet    Refill:  0   benzonatate (TESSALON) 200 MG capsule    Sig: Take 1 capsule (200 mg total) by mouth 3 (three) times daily as needed for cough.    Dispense:  20 capsule    Refill:  0    No orders of the defined types were placed in this encounter.     Diagnoses and all orders for this visit:  Sinobronchitis  Other orders -     amoxicillin-clavulanate (AUGMENTIN) 875-125 MG tablet; Take 1 tablet by mouth 2 (two) times daily. Take all of this medication -     benzonatate (TESSALON) 200 MG capsule; Take 1 capsule (200 mg total) by mouth 3 (three) times daily as needed for cough.    Virtual Visit via telephone Note  I discussed the limitations, risks, security and  privacy concerns of performing an evaluation and management service by telephone and the availability of in person appointments. The patient was identified with two identifiers. Pt.expressed understanding and agreed to proceed. Pt. Is at home. Dr. Livia Snellen is in his office.  Follow Up Instructions:   I discussed the assessment and treatment plan with the patient. The patient was provided an opportunity to ask questions and all were answered. The patient agreed with the plan and demonstrated an understanding of the instructions.   The patient was advised to call back or seek an in-person evaluation if the symptoms worsen or if the condition fails to improve as anticipated.   Total minutes including chart review and phone contact time: 8   Follow up plan: Return if symptoms worsen or fail to improve.  Claretta Fraise, MD [place]

## 2022-05-03 ENCOUNTER — Other Ambulatory Visit: Payer: Self-pay | Admitting: Family Medicine

## 2022-05-29 DIAGNOSIS — M25561 Pain in right knee: Secondary | ICD-10-CM | POA: Diagnosis not present

## 2022-06-21 ENCOUNTER — Ambulatory Visit: Payer: Medicare Other | Admitting: Obstetrics & Gynecology

## 2022-06-22 ENCOUNTER — Encounter: Payer: Self-pay | Admitting: Obstetrics & Gynecology

## 2022-06-22 ENCOUNTER — Ambulatory Visit: Payer: Medicare Other | Admitting: Obstetrics & Gynecology

## 2022-06-22 VITALS — BP 137/69 | HR 76

## 2022-06-22 DIAGNOSIS — N813 Complete uterovaginal prolapse: Secondary | ICD-10-CM

## 2022-06-22 DIAGNOSIS — Z4689 Encounter for fitting and adjustment of other specified devices: Secondary | ICD-10-CM

## 2022-06-22 NOTE — Progress Notes (Signed)
Chief Complaint  Patient presents with   Pessary Check    Blood pressure 137/69, pulse 76.  Christie Bruce presents today for routine follow up related to her pessary.   She uses a Milex ring with support #2 She reports no vaginal discharge and no vaginal bleeding   Likert scale(1 not bothersome -5 very bothersome)  :  1  Exam reveals no undue vaginal mucosal pressure of breakdown, no discharge and no vaginal bleeding.  Vaginal Epithelial Abnormality Classification System:   0 0    No abnormalities 1    Epithelial erythema 2    Granulation tissue 3    Epithelial break or erosion, 1 cm or less 4    Epithelial break or erosion, 1 cm or greater  The pessary is removed, cleaned and replaced without difficulty.    No diagnosis found.   Christie Bruce will be sen back in 4 months for continued follow up.  Florian Buff, MD  06/22/2022 9:41 AM

## 2022-07-24 ENCOUNTER — Ambulatory Visit: Payer: Medicare Other

## 2022-08-01 ENCOUNTER — Other Ambulatory Visit: Payer: Self-pay | Admitting: Family Medicine

## 2022-08-03 ENCOUNTER — Telehealth: Payer: Self-pay | Admitting: Family Medicine

## 2022-08-03 NOTE — Telephone Encounter (Signed)
Left message for patient to call back and schedule Medicare Annual Wellness Visit (AWV) to be completed by video or phone.   Last AWV: 07/20/2021   Please schedule at anytime with [place]     Any questions, please contact me at [phone]   Thank you,   [facility] for [place] [facility] ??[phone] or ??[phone]

## 2022-09-17 ENCOUNTER — Telehealth: Payer: Self-pay | Admitting: Family Medicine

## 2022-09-17 ENCOUNTER — Ambulatory Visit: Payer: Medicare Other | Admitting: Family Medicine

## 2022-09-17 NOTE — Telephone Encounter (Signed)
Contacted [name] to schedule their annual wellness visit. Appointment made for 10/03/2022.  Thank you,  Colletta [place],  [place] [facility] Direct Dial ??[phone]

## 2022-09-18 ENCOUNTER — Encounter: Payer: Self-pay | Admitting: Family Medicine

## 2022-10-02 NOTE — Patient Instructions (Incomplete)
[name] , Thank you for taking time to come for your Medicare Wellness Visit. I appreciate your ongoing commitment to your health goals. Please review the following plan we discussed and let me know if I can assist you in the future.   These are the goals we discussed:  Goals       Patient Stated (pt-stated)      "I just want to stay healthy"        This is a list of the screening recommended for you and due dates:  Health Maintenance  Topic Date Due   COVID-19 Vaccine (1) Never done   DTaP/Tdap/Td vaccine (1 - Tdap) Never done   Zoster (Shingles) Vaccine (1 of 2) Never done   Pneumonia Vaccine (2 of 2 - PPSV23 or PCV20) 09/04/2015   Medicare Annual Wellness Visit  07/20/2022   Flu Shot  10/21/2022*   DEXA scan (bone density measurement)  Completed   HPV Vaccine  Aged Out  *Topic was postponed. The date shown is not the original due date.    Advanced directives: ***  Conditions/risks identified: Aim for 30 minutes of exercise or brisk walking, 6-8 glasses of water, and 5 servings of fruits and vegetables each day.   Next appointment: Follow up in one year for your annual wellness visit    Preventive Care 65 Years and Older, Female Preventive care refers to lifestyle choices and visits with your health care provider that can promote health and wellness. What does preventive care include? A yearly physical exam. This is also called an annual well check. Dental exams once or twice a year. Routine eye exams. Ask your health care provider how often you should have your eyes checked. Personal lifestyle choices, including: Daily care of your teeth and gums. Regular physical activity. Eating a healthy diet. Avoiding tobacco and drug use. Limiting alcohol use. Practicing safe sex. Taking low-dose aspirin every day. Taking vitamin and mineral supplements as recommended by your health care provider. What happens during an annual well check? The services and screenings done by  your health care provider during your annual well check will depend on your age, overall health, lifestyle risk factors, and family history of disease. Counseling  Your health care provider may ask you questions about your: Alcohol use. Tobacco use. Drug use. Emotional well-being. Home and relationship well-being. Sexual activity. Eating habits. History of falls. Memory and ability to understand (cognition). Work and work Statistician. Reproductive health. Screening  You may have the following tests or measurements: Height, weight, and BMI. Blood pressure. Lipid and cholesterol levels. These may be checked every 5 years, or more frequently if you are over 35 years old. Skin check. Lung cancer screening. You may have this screening every year starting at age 20 if you have a 30-pack-year history of smoking and currently smoke or have quit within the past 15 years. Fecal occult blood test (FOBT) of the stool. You may have this test every year starting at age 77. Flexible sigmoidoscopy or colonoscopy. You may have a sigmoidoscopy every 5 years or a colonoscopy every 10 years starting at age 48. Hepatitis C blood test. Hepatitis B blood test. Sexually transmitted disease (STD) testing. Diabetes screening. This is done by checking your blood sugar (glucose) after you have not eaten for a while (fasting). You may have this done every 1-3 years. Bone density scan. This is done to screen for osteoporosis. You may have this done starting at age 68. Mammogram. This may be done  every 1-2 years. Talk to your health care provider about how often you should have regular mammograms. Talk with your health care provider about your test results, treatment options, and if necessary, the need for more tests. Vaccines  Your health care provider may recommend certain vaccines, such as: Influenza vaccine. This is recommended every year. Tetanus, diphtheria, and acellular pertussis (Tdap, Td) vaccine. You  may need a Td booster every 10 years. Zoster vaccine. You may need this after age 84. Pneumococcal 13-valent conjugate (PCV13) vaccine. One dose is recommended after age 17. Pneumococcal polysaccharide (PPSV23) vaccine. One dose is recommended after age 49. Talk to your health care provider about which screenings and vaccines you need and how often you need them. This information is not intended to replace advice given to you by your health care provider. Make sure you discuss any questions you have with your health care provider. Document Released: 08/05/2015 Document Revised: 03/28/2016 Document Reviewed: 05/10/2015 Elsevier Interactive Patient Education  2017 [employer] Prevention in the Home Falls can cause injuries. They can happen to people of all ages. There are many things you can do to make your home safe and to help prevent falls. What can I do on the outside of my home? Regularly fix the edges of walkways and driveways and fix any cracks. Remove anything that might make you trip as you walk through a door, such as a raised step or threshold. Trim any bushes or trees on the path to your home. Use bright outdoor lighting. Clear any walking paths of anything that might make someone trip, such as rocks or tools. Regularly check to see if handrails are loose or broken. Make sure that both sides of any steps have handrails. Any raised decks and porches should have guardrails on the edges. Have any leaves, snow, or ice cleared regularly. Use sand or salt on walking paths during winter. Clean up any spills in your garage right away. This includes oil or grease spills. What can I do in the bathroom? Use night lights. Install grab bars by the toilet and in the tub and shower. Do not use towel bars as grab bars. Use non-skid mats or decals in the tub or shower. If you need to sit down in the shower, use a plastic, non-slip stool. Keep the floor dry. Clean up any water that spills  on the floor as soon as it happens. Remove soap buildup in the tub or shower regularly. Attach bath mats securely with double-sided non-slip rug tape. Do not have throw rugs and other things on the floor that can make you trip. What can I do in the bedroom? Use night lights. Make sure that you have a light by your bed that is easy to reach. Do not use any sheets or blankets that are too big for your bed. They should not hang down onto the floor. Have a firm chair that has side arms. You can use this for support while you get dressed. Do not have throw rugs and other things on the floor that can make you trip. What can I do in the kitchen? Clean up any spills right away. Avoid walking on wet floors. Keep items that you use a lot in easy-to-reach places. If you need to reach something above you, use a strong step stool that has a grab bar. Keep electrical cords out of the way. Do not use floor polish or wax that makes floors slippery. If you must use wax, use  non-skid floor wax. Do not have throw rugs and other things on the floor that can make you trip. What can I do with my stairs? Do not leave any items on the stairs. Make sure that there are handrails on both sides of the stairs and use them. Fix handrails that are broken or loose. Make sure that handrails are as long as the stairways. Check any carpeting to make sure that it is firmly attached to the stairs. Fix any carpet that is loose or worn. Avoid having throw rugs at the top or bottom of the stairs. If you do have throw rugs, attach them to the floor with carpet tape. Make sure that you have a light switch at the top of the stairs and the bottom of the stairs. If you do not have them, ask someone to add them for you. What else can I do to help prevent falls? Wear shoes that: Do not have high heels. Have rubber bottoms. Are comfortable and fit you well. Are closed at the toe. Do not wear sandals. If you use a stepladder: Make  sure that it is fully opened. Do not climb a closed stepladder. Make sure that both sides of the stepladder are locked into place. Ask someone to hold it for you, if possible. Clearly mark and make sure that you can see: Any grab bars or handrails. First and last steps. Where the edge of each step is. Use tools that help you move around (mobility aids) if they are needed. These include: Canes. Walkers. Scooters. Crutches. Turn on the lights when you go into a dark area. Replace any light bulbs as soon as they burn out. Set up your furniture so you have a clear path. Avoid moving your furniture around. If any of your floors are uneven, fix them. If there are any pets around you, be aware of where they are. Review your medicines with your doctor. Some medicines can make you feel dizzy. This can increase your chance of falling. Ask your doctor what other things that you can do to help prevent falls. This information is not intended to replace advice given to you by your health care provider. Make sure you discuss any questions you have with your health care provider. Document Released: 05/05/2009 Document Revised: 12/15/2015 Document Reviewed: 08/13/2014 Elsevier Interactive Patient Education  2017 [employer].

## 2022-10-02 NOTE — Progress Notes (Unsigned)
Subjective:   Christie Bruce is a 82 y.o. female who presents for Medicare Annual (Subsequent) preventive examination.  Review of Systems    ***       Objective:    There were no vitals filed for this visit. There is no height or weight on file to calculate BMI.     07/20/2021    3:18 PM 02/08/2017    9:07 AM 09/10/2016   11:37 AM 02/19/2016    6:22 PM 02/13/2016    9:41 AM 02/10/2016    9:22 PM 12/28/2015   11:09 AM  Advanced Directives  Does Patient Have a Medical Advance Directive? No No No No No No No  Would patient like information on creating a medical advance directive? No - Patient declined   No - patient declined information No - patient declined information No - patient declined information Yes - Spiritual care consult ordered    Current Medications (verified) Outpatient Encounter Medications as of 10/03/2022  Medication Sig   albuterol (PROVENTIL HFA;VENTOLIN HFA) 108 (90 Base) MCG/ACT inhaler Inhale 2 puffs into the lungs every 6 (six) hours as needed for wheezing or shortness of breath.   ALPRAZolam (XANAX) 0.25 MG tablet Take 1 tablet (0.25 mg total) by mouth 2 (two) times daily.   amLODipine (NORVASC) 5 MG tablet TAKE ONE (1) TABLET EACH DAY   colchicine 0.6 MG tablet TAKE 1 TABLET AT THE ONSET OF SYMPTOMS MAY REPEAT IN 2 HOURS IF NEEDED (MAX OF 2 TABLETS DAILY)   fexofenadine (ALLEGRA) 60 MG tablet Take 1 tablet (60 mg total) by mouth 2 (two) times daily. For allergy and sinus   fluticasone (FLONASE) 50 MCG/ACT nasal spray USE 2 SPRAYS IN EACH NOSTRIL DAILY   ipratropium-albuterol (DUONEB) 0.5-2.5 (3) MG/3ML SOLN NEBULIZE 1 VIAL EVERY 6 HOURS AS NEEDED   levothyroxine (SYNTHROID) 88 MCG tablet TAKE 1 TABLET BY MOUTH EVERY MORNING BEFORE BREAKFAST   omeprazole (PRILOSEC) 40 MG capsule TAKE ONE (1) CAPSULE EACH DAY   rosuvastatin (CRESTOR) 10 MG tablet Take 1 tablet (10 mg total) by mouth daily.   Spacer/Aero Chamber Mouthpiece MISC 1 Units 4 (four) times daily by  Does not apply route.   spironolactone (ALDACTONE) 25 MG tablet Take 1 tablet (25 mg total) by mouth daily.   No facility-administered encounter medications on file as of 10/03/2022.    Allergies (verified) Celebrex [celecoxib], Hydrochlorothiazide, Lisinopril, and Sulfa antibiotics   History: Past Medical History:  Diagnosis Date   Asthma    small issue and uses inhaler if needed   Breast cancer ([place])    Chronic kidney disease    DCIS (ductal carcinoma in situ) of breast 09/12/2011   GERD (gastroesophageal reflux disease) 09/12/2011   Gout    Heart murmur    Hypertension    PONV (postoperative nausea and vomiting)    Thyroid disease    Uterine prolapse    Past Surgical History:  Procedure Laterality Date   bilateral mastectomy  07/19/10   bilat mastectomy for DCIS   CHOLECYSTECTOMY N/A 02/13/2016   Procedure: LAPAROSCOPIC CHOLECYSTECTOMY;  Surgeon: Aviva Signs, MD;  Location: [facility];  Service: General;  Laterality: N/A;   HIP SURGERY     INTRAMEDULLARY (IM) NAIL INTERTROCHANTERIC Left 12/28/2015   Procedure: INTRAMEDULLARY (IM) NAIL LEFT HIP;  Surgeon: Renette Butters, MD;  Location: [facility];  Service: Orthopedics;  Laterality: Left;   KNEE SURGERY Left    WRIST SURGERY Left    Family History  Problem Relation  Age of Onset   Cancer Mother        melanoma   Stroke Mother    Cancer Father        prostate cancer   Heart disease Father    Hypertension Father    Cancer Brother        lung cancer   Hypertension Sister    Heart attack Brother    Diabetes Brother    Diabetes Brother    Cancer Paternal Uncle        lung cancer   Social History   Socioeconomic History   Marital status: Widowed    Spouse name: Not on file   Number of children: 3   Years of education: 9   Highest education level: 9th grade  Occupational History   Not on file  Tobacco Use   Smoking status: Former    Packs/day: 0.25    Years: 15.00    Total pack years: 3.75    Types: Cigarettes     Quit date: 1997    Years since quitting: 27.2   Smokeless tobacco: Never  Vaping Use   Vaping Use: Never used  Substance and Sexual Activity   Alcohol use: No   Drug use: No   Sexual activity: Not Currently    Birth control/protection: Post-menopausal  Other Topics Concern   Not on file  Social History Narrative   Not on file   Social Determinants of Health   Financial Resource Strain: Low Risk  (07/20/2021)   Overall Financial Resource Strain (CARDIA)    Difficulty of Paying Living Expenses: Not hard at all  Food Insecurity: No Food Insecurity (07/20/2021)   Hunger Vital Sign    Worried About Running Out of Food in the Last Year: Never true    Ran Out of Food in the Last Year: Never true  Transportation Needs: No Transportation Needs (07/20/2021)   PRAPARE - Hydrologist (Medical): No    Lack of Transportation (Non-Medical): No  Physical Activity: Sufficiently Active (07/20/2021)   Exercise Vital Sign    Days of Exercise per Week: 4 days    Minutes of Exercise per Session: 50 min  Stress: Stress Concern Present (07/20/2021)   [place]    Feeling of Stress : To some extent  Social Connections: Moderately Integrated (07/20/2021)   Social Connection and Isolation Panel [NHANES]    Frequency of Communication with Friends and Family: More than three times a week    Frequency of Social Gatherings with Friends and Family: More than three times a week    Attends Religious Services: More than 4 times per year    Active Member of [employer] or Organizations: Yes    Attends Archivist Meetings: More than 4 times per year    Marital Status: Widowed    Tobacco Counseling Counseling given: Not Answered   Clinical Intake:                 Diabetic?No          Activities of Daily Living     No data to display          Patient Care Team: Claretta Fraise, MD  as PCP - General (Family Medicine)  Indicate any recent Medical Services you may have received from other than [facility] providers in the past year (date may be approximate).     Assessment:   This is a routine wellness  examination for Christie Bruce.  Hearing/Vision screen No results found.  Dietary issues and exercise activities discussed:     Goals Addressed   None    Depression Screen    03/14/2022    3:47 PM 08/30/2021   12:41 PM 07/20/2021    3:18 PM 02/01/2021    9:52 AM 11/22/2020    4:04 PM 09/29/2020    3:46 PM 04/13/2020    1:14 PM  PHQ 2/9 Scores  PHQ - 2 Score 0 0 0 0 0 0 0  PHQ- 9 Score  0         Fall Risk    03/14/2022    3:47 PM 08/30/2021   12:41 PM 07/20/2021    3:18 PM 02/01/2021    9:52 AM 11/22/2020    4:04 PM  Fall Risk   Falls in the past year? 0 0 0 0 1  Number falls in past yr:  0   0  Injury with Fall?  0   0  Risk for fall due to :     History of fall(s)    FALL RISK PREVENTION PERTAINING TO THE HOME:  Any stairs in or around the home? No  If so, are there any without handrails? No  Home free of loose throw rugs in walkways, pet beds, electrical cords, etc? Yes  Adequate lighting in your home to reduce risk of falls? Yes   ASSISTIVE DEVICES UTILIZED TO PREVENT FALLS:  Life alert? No  Use of a cane, walker or w/c? Yes  Grab bars in the bathroom? Yes  Shower chair or bench in shower? No  Elevated toilet seat or a handicapped toilet? Yes   TIMED UP AND GO:  Was the test performed? No . Telephonic visit   Cognitive Function:        07/20/2021    3:23 PM  6CIT Screen  What Year? 0 points  What month? 0 points  What time? 0 points  Count back from 20 0 points  Months in reverse 4 points  Repeat phrase 2 points  Total Score 6 points    Immunizations Immunization History  Administered Date(s) Administered   Influenza, High Dose Seasonal PF 07/29/2017   Influenza,inj,Quad PF,6+ Mos 05/03/2014, 05/13/2015, 06/02/2018   Pneumococcal  Conjugate-13 09/03/2014    {TDAP status:2101805}  {Flu Vaccine status:2101806}  {Pneumococcal vaccine status:2101807}  Covid-19 vaccine status: Information provided on how to obtain vaccines.   Qualifies for Shingles Vaccine? Yes   Zostavax completed No   Shingrix Completed?: No.    Education has been provided regarding the importance of this vaccine. Patient has been advised to call insurance company to determine out of pocket expense if they have not yet received this vaccine. Advised may also receive vaccine at local pharmacy or Health Dept. Verbalized acceptance and understanding.  Screening Tests Health Maintenance  Topic Date Due   COVID-19 Vaccine (1) Never done   DTaP/Tdap/Td (1 - Tdap) Never done   Zoster Vaccines- Shingrix (1 of 2) Never done   Pneumonia Vaccine 52+ Years old (2 of 2 - PPSV23 or PCV20) 09/04/2015   Medicare Annual Wellness (AWV)  07/20/2022   INFLUENZA VACCINE  10/21/2022 (Originally 02/20/2022)   DEXA SCAN  Completed   HPV VACCINES  Aged Out    Health Maintenance  Health Maintenance Due  Topic Date Due   COVID-19 Vaccine (1) Never done   DTaP/Tdap/Td (1 - Tdap) Never done   Zoster Vaccines- Shingrix (1 of 2) Never done  Pneumonia Vaccine 24+ Years old (2 of 2 - PPSV23 or PCV20) 09/04/2015   Medicare Annual Wellness (AWV)  07/20/2022    Colorectal cancer screening: No longer required.   Mammogram status: No longer required due to age.  Bone Density status: Completed 07/27/21. Results reflect: Bone density results: OSTEOPOROSIS. Repeat every 2 years.  Lung Cancer Screening: (Low Dose CT Chest recommended if Age 66-80 years, 30 pack-year currently smoking OR have quit w/in 15years.) does not qualify.   Lung Cancer Screening Referral: n/a  Additional Screening:  Hepatitis C Screening: does not qualify  Vision Screening: Recommended annual ophthalmology exams for early detection of glaucoma and other disorders of the eye. Is the patient up to  date with their annual eye exam?  {YES/NO:21197} Who is the provider or what is the name of the office in which the patient attends annual eye exams? *** If pt is not established with a provider, would they like to be referred to a provider to establish care? {YES/NO:21197}.   Dental Screening: Recommended annual dental exams for proper oral hygiene  Community Resource Referral / Chronic Care Management: CRR required this visit?  {YES/NO:21197}  CCM required this visit?  {YES/NO:21197}     Plan:     I have personally reviewed and noted the following in the patient's chart:   Medical and social history Use of alcohol, tobacco or illicit drugs  Current medications and supplements including opioid prescriptions. {Opioid Prescriptions:[id number]} Functional ability and status Nutritional status Physical activity Advanced directives List of other physicians Hospitalizations, surgeries, and ER visits in previous 12 months Vitals Screenings to include cognitive, depression, and falls Referrals and appointments  In addition, I have reviewed and discussed with patient certain preventive protocols, quality metrics, and best practice recommendations. A written personalized care plan for preventive services as well as general preventive health recommendations were provided to patient.     Vanetta Mulders, [place]   [id number]   Due to this being a virtual visit, the after visit summary with patients personalized plan was offered to patient via mail or my-chart. ***Patient declined at this time./ Patient would like to access on my-chart/ per request, patient was mailed a copy of AVS./ Patient preferred to pick up at office at next visit   Nurse Notes: ***

## 2022-10-03 ENCOUNTER — Ambulatory Visit (INDEPENDENT_AMBULATORY_CARE_PROVIDER_SITE_OTHER): Payer: Medicare Other

## 2022-10-03 VITALS — Ht 62.0 in | Wt 141.0 lb

## 2022-10-03 DIAGNOSIS — Z0000 Encounter for general adult medical examination without abnormal findings: Secondary | ICD-10-CM | POA: Diagnosis not present

## 2022-10-17 ENCOUNTER — Encounter: Payer: Self-pay | Admitting: Family Medicine

## 2022-10-17 ENCOUNTER — Ambulatory Visit (INDEPENDENT_AMBULATORY_CARE_PROVIDER_SITE_OTHER): Payer: Medicare Other | Admitting: Family Medicine

## 2022-10-17 VITALS — BP 138/67 | HR 72 | Temp 97.7°F | Ht 62.0 in | Wt 142.0 lb

## 2022-10-17 DIAGNOSIS — F419 Anxiety disorder, unspecified: Secondary | ICD-10-CM

## 2022-10-17 DIAGNOSIS — Z79899 Other long term (current) drug therapy: Secondary | ICD-10-CM

## 2022-10-17 DIAGNOSIS — Z13220 Encounter for screening for lipoid disorders: Secondary | ICD-10-CM

## 2022-10-17 DIAGNOSIS — D649 Anemia, unspecified: Secondary | ICD-10-CM

## 2022-10-17 DIAGNOSIS — I10 Essential (primary) hypertension: Secondary | ICD-10-CM

## 2022-10-17 DIAGNOSIS — E039 Hypothyroidism, unspecified: Secondary | ICD-10-CM | POA: Diagnosis not present

## 2022-10-17 MED ORDER — ALPRAZOLAM 0.25 MG PO TABS: 0.2500 mg | ORAL_TABLET | Freq: Two times a day (BID) | ORAL | 5 refills | Status: DC

## 2022-10-17 NOTE — Progress Notes (Signed)
Subjective:  Patient ID: Christie Bruce, female    DOB: Mar 07, 1941  Age: 82 y.o. MRN: [id number]  CC: Medical Management of Chronic Issues   HPI Christie Bruce presents for  follow-up of  follow-up on  thyroid. The patient has a history of hypothyroidism for many years. It has been stable recently. Pt. denies any change in  voice, loss of hair, heat or cold intolerance. Energy level has been adequate to good. Patient denies constipation and diarrhea. No myxedema. Medication is as noted below. Verified that pt is taking it daily on an empty stomach. Well tolerated.   hypertension. Patient has no history of headache chest pain or shortness of breath or recent cough. Patient also denies symptoms of TIA such as focal numbness or weakness. Patient denies side effects from medication. States taking it regularly.   in for follow-up of elevated cholesterol. Doing well without complaints on current medication. Denies side effects of statin including myalgia and arthralgia and nausea. Currently no chest pain, shortness of breath or other cardiovascular related symptoms noted.  Patient in for follow-up of GERD. Currently asymptomatic taking  PPI daily. There is no chest pain or heartburn. No hematemesis and no melena. No dysphagia or choking. Onset is remote. Progression is stable. Complicating factors, none.  GAD7 score today is 8.  History Christie Bruce has a past medical history of Asthma, Breast cancer ([place]), Chronic kidney disease, DCIS (ductal carcinoma in situ) of breast (09/12/2011), GERD (gastroesophageal reflux disease) (09/12/2011), Gout, Heart murmur, Hypertension, PONV (postoperative nausea and vomiting), Thyroid disease, and Uterine prolapse.   She has a past surgical history that includes bilateral mastectomy (07/19/10); Knee surgery (Left); Wrist surgery (Left); Intramedullary (im) nail intertrochanteric (Left, 12/28/2015); Cholecystectomy (N/A, 02/13/2016); and Hip surgery.   Her family history  includes Cancer in her brother, father, mother, and paternal uncle; Diabetes in her brother and brother; Heart attack in her brother; Heart disease in her father; Hypertension in her father and sister; Stroke in her mother.She reports that she quit smoking about 27 years ago. Her smoking use included cigarettes. She has a 3.75 pack-year smoking history. She has never used smokeless tobacco. She reports that she does not drink alcohol and does not use drugs.  Current Outpatient Medications on File Prior to Visit  Medication Sig Dispense Refill   albuterol (PROVENTIL HFA;VENTOLIN HFA) 108 (90 Base) MCG/ACT inhaler Inhale 2 puffs into the lungs every 6 (six) hours as needed for wheezing or shortness of breath. 1 Inhaler 0   amLODipine (NORVASC) 5 MG tablet TAKE ONE (1) TABLET EACH DAY 90 tablet 3   colchicine 0.6 MG tablet TAKE 1 TABLET AT THE ONSET OF SYMPTOMS MAY REPEAT IN 2 HOURS IF NEEDED (MAX OF 2 TABLETS DAILY) 30 tablet 0   fexofenadine (ALLEGRA) 60 MG tablet Take 1 tablet (60 mg total) by mouth 2 (two) times daily. For allergy and sinus 180 tablet 3   fluticasone (FLONASE) 50 MCG/ACT nasal spray USE 2 SPRAYS IN EACH NOSTRIL DAILY 16 g 5   ipratropium-albuterol (DUONEB) 0.5-2.5 (3) MG/3ML SOLN NEBULIZE 1 VIAL EVERY 6 HOURS AS NEEDED 360 mL 6   levothyroxine (SYNTHROID) 88 MCG tablet TAKE 1 TABLET BY MOUTH EVERY MORNING BEFORE BREAKFAST 90 tablet 1   omeprazole (PRILOSEC) 40 MG capsule TAKE ONE (1) CAPSULE EACH DAY 90 capsule 3   rosuvastatin (CRESTOR) 10 MG tablet Take 1 tablet (10 mg total) by mouth daily. 90 tablet 3   Spacer/Aero Chamber Mouthpiece MISC 1 Units 4 (  four) times daily by Does not apply route. 1 each 0   spironolactone (ALDACTONE) 25 MG tablet Take 1 tablet (25 mg total) by mouth daily. 90 tablet 3   No current facility-administered medications on file prior to visit.    ROS Review of Systems  Constitutional: Negative.   HENT: Negative.    Eyes:  Negative for visual  disturbance.  Respiratory:  Negative for shortness of breath.   Cardiovascular:  Negative for chest pain.  Gastrointestinal:  Negative for abdominal pain.  Musculoskeletal:  Positive for arthralgias (right knee pain. Sees ortho for joint injections).    Objective:  BP 138/67   Pulse 72   Temp 97.7 F (36.5 C)   Ht 5\' 2"  (1.575 m)   Wt 142 lb (64.4 kg)   SpO2 98%   BMI 25.97 kg/m   BP Readings from Last 3 Encounters:  10/17/22 138/67  06/22/22 137/69  03/14/22 135/67    Wt Readings from Last 3 Encounters:  10/17/22 142 lb (64.4 kg)  10/03/22 141 lb (64 kg)  03/14/22 141 lb 3.2 oz (64 kg)     Physical Exam Constitutional:      General: She is not in acute distress.    Appearance: She is well-developed.  Cardiovascular:     Rate and Rhythm: Normal rate and regular rhythm.  Pulmonary:     Breath sounds: Normal breath sounds.  Musculoskeletal:        General: Tenderness (right knee. Baker's cyst present) present. Normal range of motion.  Skin:    General: Skin is warm and dry.  Neurological:     Mental Status: She is alert and oriented to person, place, and time.       Assessment & Plan:   Christie Bruce was seen today for medical management of chronic issues.  Diagnoses and all orders for this visit:  Hypothyroidism, unspecified type -     TSH + free T4  Essential hypertension -     CBC with Differential/Platelet -     CMP14+EGFR  Anemia, unspecified type -     CBC with Differential/Platelet  Lipid screening -     Lipid panel  Controlled substance agreement signed -     Drug Screen 10 W/Conf, Se  Anxiety -     ALPRAZolam (XANAX) 0.25 MG tablet; Take 1 tablet (0.25 mg total) by mouth 2 (two) times daily.   Allergies as of 10/17/2022       Reactions   Celebrex [celecoxib]    Hydrochlorothiazide    Lisinopril Swelling   Sulfa Antibiotics    Had sores in my mouth, bottom of my feet "the skin just came right off"        Medication List         Accurate as of October 17, 2022 12:05 PM. If you have any questions, ask your nurse or doctor.          albuterol 108 (90 Base) MCG/ACT inhaler Commonly known as: VENTOLIN HFA Inhale 2 puffs into the lungs every 6 (six) hours as needed for wheezing or shortness of breath.   ALPRAZolam 0.25 MG tablet Commonly known as: XANAX Take 1 tablet (0.25 mg total) by mouth 2 (two) times daily.   amLODipine 5 MG tablet Commonly known as: NORVASC TAKE ONE (1) TABLET EACH DAY   colchicine 0.6 MG tablet TAKE 1 TABLET AT THE ONSET OF SYMPTOMS MAY REPEAT IN 2 HOURS IF NEEDED (MAX OF 2 TABLETS DAILY)   fexofenadine 60 MG  tablet Commonly known as: ALLEGRA Take 1 tablet (60 mg total) by mouth 2 (two) times daily. For allergy and sinus   fluticasone 50 MCG/ACT nasal spray Commonly known as: FLONASE USE 2 SPRAYS IN EACH NOSTRIL DAILY   ipratropium-albuterol 0.5-2.5 (3) MG/3ML Soln Commonly known as: DUONEB NEBULIZE 1 VIAL EVERY 6 HOURS AS NEEDED   levothyroxine 88 MCG tablet Commonly known as: SYNTHROID TAKE 1 TABLET BY MOUTH EVERY MORNING BEFORE BREAKFAST   omeprazole 40 MG capsule Commonly known as: PRILOSEC TAKE ONE (1) CAPSULE EACH DAY   rosuvastatin 10 MG tablet Commonly known as: Crestor Take 1 tablet (10 mg total) by mouth daily.   Spacer/Aero Chamber Mouthpiece Misc 1 Units 4 (four) times daily by Does not apply route.   spironolactone 25 MG tablet Commonly known as: ALDACTONE Take 1 tablet (25 mg total) by mouth daily.        Meds ordered this encounter  Medications   ALPRAZolam (XANAX) 0.25 MG tablet    Sig: Take 1 tablet (0.25 mg total) by mouth 2 (two) times daily.    Dispense:  60 tablet    Refill:  5    She will call ortho since she has an ongoing relationship. Offered referral if needed.  Follow-up: Return in about 6 months (around 04/19/2023).  Claretta Fraise, M.D.

## 2022-10-22 ENCOUNTER — Ambulatory Visit: Payer: Medicare Other | Admitting: Obstetrics & Gynecology

## 2022-10-23 NOTE — Progress Notes (Signed)
Hello Petra,  Your lab result is normal and/or stable.Some minor variations that are not significant are commonly marked abnormal, but do not represent any medical problem for you.  Best regards, Tiffney Haughton, M.D.

## 2022-10-25 ENCOUNTER — Ambulatory Visit: Payer: Medicare Other | Admitting: Obstetrics & Gynecology

## 2022-10-25 ENCOUNTER — Encounter: Payer: Self-pay | Admitting: Obstetrics & Gynecology

## 2022-10-25 VITALS — BP 139/66 | HR 84

## 2022-10-25 DIAGNOSIS — Z4689 Encounter for fitting and adjustment of other specified devices: Secondary | ICD-10-CM

## 2022-10-25 DIAGNOSIS — N813 Complete uterovaginal prolapse: Secondary | ICD-10-CM

## 2022-10-25 NOTE — Progress Notes (Signed)
Chief Complaint  Patient presents with   Pessary Check    Blood pressure 139/66, pulse 84.  Christie Bruce presents today for routine follow up related to her pessary.   She uses a Milex ring with support #2 She reports no vaginal discharge and no vaginal bleeding   Likert scale(1 not bothersome -5 very bothersome)  :  1  Exam reveals no undue vaginal mucosal pressure of breakdown, no discharge and no vaginal bleeding.  Vaginal Epithelial Abnormality Classification System:   0 0    No abnormalities 1    Epithelial erythema 2    Granulation tissue 3    Epithelial break or erosion, 1 cm or less 4    Epithelial break or erosion, 1 cm or greater  The pessary is removed, cleaned and replaced without difficulty.      ICD-10-CM   1. Pessary maintenance, Milex ring with support #2  Z46.89     2. Uterovaginal prolapse, complete  N81.3        Christie Bruce will be sen back in 4 months for continued follow up.  Florian Buff, MD  10/25/2022 9:19 AM

## 2022-10-26 LAB — DRUG SCREEN 10 W/CONF, SERUM
Amphetamines, IA: NEGATIVE ng/mL
Barbiturates, IA: NEGATIVE ug/mL
Benzodiazepines, IA: POSITIVE ng/mL — AB
Cocaine & Metabolite, IA: NEGATIVE ng/mL
Methadone, IA: NEGATIVE ng/mL
Opiates, IA: NEGATIVE ng/mL
Oxycodones, IA: NEGATIVE ng/mL
Phencyclidine, IA: NEGATIVE ng/mL
Propoxyphene, IA: NEGATIVE ng/mL
THC(Marijuana) Metabolite, IA: NEGATIVE ng/mL

## 2022-10-26 LAB — BENZODIAZEPINES,MS,WB/SP RFX
7-Aminoclonazepam: NEGATIVE ng/mL
Alprazolam: 15.4 ng/mL
Benzodiazepines Confirm: POSITIVE
Chlordiazepoxide: NEGATIVE
Clonazepam: NEGATIVE ng/mL
Desalkylflurazepam: NEGATIVE ng/mL
Desmethylchlordiazepoxide: NEGATIVE
Desmethyldiazepam: NEGATIVE ng/mL
Diazepam: NEGATIVE ng/mL
Flurazepam: NEGATIVE ng/mL
Lorazepam: NEGATIVE ng/mL
Midazolam: NEGATIVE ng/mL
Oxazepam: NEGATIVE ng/mL
Temazepam: NEGATIVE ng/mL
Triazolam: NEGATIVE ng/mL

## 2022-10-26 LAB — CMP14+EGFR
ALT: 10 IU/L (ref 0–32)
AST: 24 IU/L (ref 0–40)
Albumin/Globulin Ratio: 1.8 (ref 1.2–2.2)
Albumin: 4.6 g/dL (ref 3.7–4.7)
Alkaline Phosphatase: 110 IU/L (ref 44–121)
BUN/Creatinine Ratio: 11 — ABNORMAL LOW (ref 12–28)
BUN: 15 mg/dL (ref 8–27)
Bilirubin Total: 0.7 mg/dL (ref 0.0–1.2)
CO2: 20 mmol/L (ref 20–29)
Calcium: 9.3 mg/dL (ref 8.7–10.3)
Chloride: 105 mmol/L (ref 96–106)
Creatinine, Ser: 1.32 mg/dL — ABNORMAL HIGH (ref 0.57–1.00)
Globulin, Total: 2.6 g/dL (ref 1.5–4.5)
Glucose: 106 mg/dL — ABNORMAL HIGH (ref 70–99)
Potassium: 3.9 mmol/L (ref 3.5–5.2)
Sodium: 142 mmol/L (ref 134–144)
Total Protein: 7.2 g/dL (ref 6.0–8.5)
eGFR: 40 mL/min/{1.73_m2} — ABNORMAL LOW (ref >59)

## 2022-10-26 LAB — LIPID PANEL
Chol/HDL Ratio: 3 ratio (ref 0.0–4.4)
Cholesterol, Total: 160 mg/dL (ref 100–199)
HDL: 54 mg/dL (ref >39)
LDL Chol Calc (NIH): 90 mg/dL (ref 0–99)
Triglycerides: 85 mg/dL (ref 0–149)
VLDL Cholesterol Cal: 16 mg/dL (ref 5–40)

## 2022-10-26 LAB — CBC WITH DIFFERENTIAL/PLATELET
Basophils Absolute: 0 10*3/uL (ref 0.0–0.2)
Basos: 1 %
EOS (ABSOLUTE): 0.1 10*3/uL (ref 0.0–0.4)
Eos: 3 %
Hematocrit: 34.2 % (ref 34.0–46.6)
Hemoglobin: 11 g/dL — ABNORMAL LOW (ref 11.1–15.9)
Immature Grans (Abs): 0 10*3/uL (ref 0.0–0.1)
Immature Granulocytes: 0 %
Lymphocytes Absolute: 2.1 10*3/uL (ref 0.7–3.1)
Lymphs: 41 %
MCH: 29.3 pg (ref 26.6–33.0)
MCHC: 32.2 g/dL (ref 31.5–35.7)
MCV: 91 fL (ref 79–97)
Monocytes Absolute: 0.5 10*3/uL (ref 0.1–0.9)
Monocytes: 9 %
Neutrophils Absolute: 2.4 10*3/uL (ref 1.4–7.0)
Neutrophils: 46 %
Platelets: 242 10*3/uL (ref 150–450)
RBC: 3.76 x10E6/uL — ABNORMAL LOW (ref 3.77–5.28)
RDW: 13.2 % (ref 11.7–15.4)
WBC: 5.1 10*3/uL (ref 3.4–10.8)

## 2022-10-26 LAB — TSH+FREE T4
Free T4: 1.39 ng/dL (ref 0.82–1.77)
TSH: 2.65 u[IU]/mL (ref 0.450–4.500)

## 2022-11-01 ENCOUNTER — Other Ambulatory Visit: Payer: Self-pay | Admitting: Family Medicine

## 2022-12-28 ENCOUNTER — Other Ambulatory Visit: Payer: Self-pay | Admitting: Family Medicine

## 2023-02-21 ENCOUNTER — Ambulatory Visit: Payer: Medicare Other | Admitting: Obstetrics & Gynecology

## 2023-02-21 ENCOUNTER — Encounter: Payer: Self-pay | Admitting: Obstetrics & Gynecology

## 2023-02-21 VITALS — BP 157/70 | HR 69 | Ht 62.0 in | Wt 138.0 lb

## 2023-02-21 DIAGNOSIS — Z4689 Encounter for fitting and adjustment of other specified devices: Secondary | ICD-10-CM

## 2023-02-21 DIAGNOSIS — N813 Complete uterovaginal prolapse: Secondary | ICD-10-CM

## 2023-02-21 NOTE — Progress Notes (Signed)
Chief Complaint  Patient presents with   Pessary Check    Blood pressure (!) 157/70, pulse 69, height 5\' 2"  (1.575 m), weight 138 lb (62.6 kg).  Christie Bruce presents today for routine follow up related to her pessary.   She uses a Milex ring with support #2 She reports no vaginal discharge and no vaginal bleeding   Likert scale(1 not bothersome -5 very bothersome)  :  1  Exam reveals no undue vaginal mucosal pressure of breakdown, no discharge and no vaginal bleeding.  Vaginal Epithelial Abnormality Classification System:   0 0    No abnormalities 1    Epithelial erythema 2    Granulation tissue 3    Epithelial break or erosion, 1 cm or less 4    Epithelial break or erosion, 1 cm or greater  The pessary is removed, cleaned and replaced without difficulty.      ICD-10-CM   1. Pessary maintenance, Milex ring with support #2  Z46.89     2. Uterovaginal prolapse, complete  N81.3        Christie Bruce will be sen back in 4 months for continued follow up.  Lazaro Arms, MD  02/21/2023 2:30 PM

## 2023-03-26 ENCOUNTER — Other Ambulatory Visit: Payer: Self-pay | Admitting: Family Medicine

## 2023-03-26 DIAGNOSIS — I10 Essential (primary) hypertension: Secondary | ICD-10-CM

## 2023-03-26 DIAGNOSIS — K219 Gastro-esophageal reflux disease without esophagitis: Secondary | ICD-10-CM

## 2023-03-26 DIAGNOSIS — J301 Allergic rhinitis due to pollen: Secondary | ICD-10-CM

## 2023-04-22 ENCOUNTER — Ambulatory Visit: Payer: Medicare Other | Admitting: Family Medicine

## 2023-04-29 ENCOUNTER — Other Ambulatory Visit: Payer: Self-pay | Admitting: Family Medicine

## 2023-04-29 DIAGNOSIS — K219 Gastro-esophageal reflux disease without esophagitis: Secondary | ICD-10-CM

## 2023-04-29 DIAGNOSIS — I10 Essential (primary) hypertension: Secondary | ICD-10-CM

## 2023-04-29 DIAGNOSIS — J301 Allergic rhinitis due to pollen: Secondary | ICD-10-CM

## 2023-05-28 ENCOUNTER — Other Ambulatory Visit: Payer: Self-pay | Admitting: Family Medicine

## 2023-05-28 DIAGNOSIS — K219 Gastro-esophageal reflux disease without esophagitis: Secondary | ICD-10-CM

## 2023-05-28 DIAGNOSIS — I10 Essential (primary) hypertension: Secondary | ICD-10-CM

## 2023-06-03 ENCOUNTER — Ambulatory Visit: Payer: Medicare Other | Admitting: Family Medicine

## 2023-06-03 ENCOUNTER — Encounter: Payer: Self-pay | Admitting: Family Medicine

## 2023-06-03 VITALS — BP 151/69 | HR 97 | Temp 98.2°F | Ht 62.0 in | Wt 135.2 lb

## 2023-06-03 DIAGNOSIS — K219 Gastro-esophageal reflux disease without esophagitis: Secondary | ICD-10-CM | POA: Diagnosis not present

## 2023-06-03 DIAGNOSIS — S0590XA Unspecified injury of unspecified eye and orbit, initial encounter: Secondary | ICD-10-CM | POA: Diagnosis not present

## 2023-06-03 DIAGNOSIS — I10 Essential (primary) hypertension: Secondary | ICD-10-CM

## 2023-06-03 DIAGNOSIS — Z13220 Encounter for screening for lipoid disorders: Secondary | ICD-10-CM

## 2023-06-03 DIAGNOSIS — E039 Hypothyroidism, unspecified: Secondary | ICD-10-CM

## 2023-06-03 DIAGNOSIS — F419 Anxiety disorder, unspecified: Secondary | ICD-10-CM

## 2023-06-03 MED ORDER — ROSUVASTATIN CALCIUM 10 MG PO TABS: 10.0000 mg | ORAL_TABLET | Freq: Every day | ORAL | 3 refills | Status: DC

## 2023-06-03 MED ORDER — LEVOTHYROXINE SODIUM 88 MCG PO TABS: 88.0000 ug | ORAL_TABLET | Freq: Every day | ORAL | 3 refills | Status: DC

## 2023-06-03 MED ORDER — ALPRAZOLAM 0.25 MG PO TABS: 0.2500 mg | ORAL_TABLET | Freq: Two times a day (BID) | ORAL | 5 refills | Status: DC

## 2023-06-03 MED ORDER — AMLODIPINE BESYLATE 5 MG PO TABS: ORAL_TABLET | ORAL | 3 refills | Status: DC

## 2023-06-03 MED ORDER — FEXOFENADINE HCL 60 MG PO TABS: 60.0000 mg | ORAL_TABLET | Freq: Two times a day (BID) | ORAL | 3 refills | Status: DC

## 2023-06-03 MED ORDER — OMEPRAZOLE 40 MG PO CPDR: DELAYED_RELEASE_CAPSULE | ORAL | 3 refills | Status: DC

## 2023-06-03 NOTE — Progress Notes (Unsigned)
Subjective:  Patient ID: Christie Bruce, female    DOB: 11-07-1940  Age: 82 y.o. MRN: [id number]  CC: Medical Management of Chronic Issues   HPI Christie Bruce presents for  follow-up of hypertension. Patient has no history of headache chest pain or shortness of breath or recent cough. Patient also denies symptoms of TIA such as focal numbness or weakness. Patient denies side effects from medication. States taking it regularly.   follow-up on  thyroid. The patient has a history of hypothyroidism for many years. It has been stable recently. Pt. denies any change in  voice, loss of hair, heat or cold intolerance. Energy level has been adequate to good. Patient denies constipation and diarrhea. No myxedema. Medication is as noted below. Verified that pt is taking it daily on an empty stomach. Well tolerated.  She rolled off of her bed 5 days ago and hit her left orbit on a wood nightstand. Sh has significant bruising and pain at the area.      10/17/2022    4:56 PM 03/14/2022    3:48 PM  GAD 7 : Generalized Anxiety Score  Nervous, Anxious, on Edge 1 1  Control/stop worrying 2 1  Worry too much - different things  0  Trouble relaxing 0 0  Restless 1 0  Easily annoyed or irritable 1 0  Afraid - awful might happen 2 0  Total GAD 7 Score  2  Anxiety Difficulty Somewhat difficult Not difficult at all       History Christie Bruce has a past medical history of Asthma, Breast cancer (HCC), Chronic kidney disease, DCIS (ductal carcinoma in situ) of breast (09/12/2011), GERD (gastroesophageal reflux disease) (09/12/2011), Gout, Heart murmur, Hypertension, PONV (postoperative nausea and vomiting), Thyroid disease, and Uterine prolapse.   She has a past surgical history that includes bilateral mastectomy (07/19/10); Knee surgery (Left); Wrist surgery (Left); Intramedullary (im) nail intertrochanteric (Left, 12/28/2015); Cholecystectomy (N/A, 02/13/2016); and Hip surgery.   Her family history includes Cancer in  her brother, father, mother, and paternal uncle; Diabetes in her brother and brother; Heart attack in her brother; Heart disease in her father; Hypertension in her father and sister; Stroke in her mother.She reports that she quit smoking about 27 years ago. Her smoking use included cigarettes. She started smoking about 42 years ago. She has a 3.8 pack-year smoking history. She has never used smokeless tobacco. She reports that she does not drink alcohol and does not use drugs.  Current Outpatient Medications on File Prior to Visit  Medication Sig Dispense Refill   albuterol (PROVENTIL HFA;VENTOLIN HFA) 108 (90 Base) MCG/ACT inhaler Inhale 2 puffs into the lungs every 6 (six) hours as needed for wheezing or shortness of breath. 1 Inhaler 0   colchicine 0.6 MG tablet TAKE 1 TABLET AT THE ONSET OF SYMPTOMS MAY REPEAT IN 2 HOURS IF NEEDED (MAX OF 2 TABLETS DAILY) 30 tablet 0   fluticasone (FLONASE) 50 MCG/ACT nasal spray USE 2 SPRAYS IN EACH NOSTRIL DAILY 16 g 1   ipratropium-albuterol (DUONEB) 0.5-2.5 (3) MG/3ML SOLN NEBULIZE 1 VIAL EVERY 6 HOURS AS NEEDED 360 mL 6   Spacer/Aero Chamber Mouthpiece MISC 1 Units 4 (four) times daily by Does not apply route. 1 each 0   spironolactone (ALDACTONE) 25 MG tablet TAKE ONE (1) TABLET BY MOUTH EVERY DAY 30 tablet 0   No current facility-administered medications on file prior to visit.    ROS Review of Systems  Constitutional: Negative.   HENT: Negative.  Eyes:  Negative for visual disturbance.  Respiratory:  Negative for shortness of breath.   Cardiovascular:  Negative for chest pain.  Gastrointestinal:  Negative for abdominal pain.  Musculoskeletal:  Negative for arthralgias.    Objective:  BP (!) 151/69   Pulse 97   Temp 98.2 F (36.8 C)   Ht 5\' 2"  (1.575 m)   Wt 135 lb 3.2 oz (61.3 kg)   SpO2 97%   BMI 24.73 kg/m   BP Readings from Last 3 Encounters:  06/03/23 (!) 151/69  02/21/23 (!) 157/70  10/25/22 139/66    Wt Readings from Last  3 Encounters:  06/03/23 135 lb 3.2 oz (61.3 kg)  02/21/23 138 lb (62.6 kg)  10/17/22 142 lb (64.4 kg)     Physical Exam Constitutional:      General: She is not in acute distress.    Appearance: She is well-developed.  Eyes:     Extraocular Movements: Extraocular movements intact.     Pupils: Pupils are equal, round, and reactive to light.  Cardiovascular:     Rate and Rhythm: Normal rate and regular rhythm.  Pulmonary:     Breath sounds: Normal breath sounds.  Musculoskeletal:        General: Normal range of motion.  Skin:    General: Skin is warm and dry.     Findings: Bruising (around left orbit) present.  Neurological:     Mental Status: She is alert and oriented to person, place, and time.       Assessment & Plan:   Christie Bruce was seen today for medical management of chronic issues.  Diagnoses and all orders for this visit:  Hypothyroidism, unspecified type -     TSH + free T4  Essential hypertension -     CBC with Differential/Platelet -     CMP14+EGFR -     amLODipine (NORVASC) 5 MG tablet; TAKE ONE (1) TABLET EACH DAY  Lipid screening -     Lipid panel  Anxiety -     ALPRAZolam (XANAX) 0.25 MG tablet; Take 1 tablet (0.25 mg total) by mouth 2 (two) times daily.  Gastroesophageal reflux disease without esophagitis -     omeprazole (PRILOSEC) 40 MG capsule; TAKE ONE (1) CAPSULE EACH DAY  Trauma to orbit, initial encounter -     CT ORBITS WO CONTRAST; Future  Other orders -     fexofenadine (ALLEGRA) 60 MG tablet; Take 1 tablet (60 mg total) by mouth 2 (two) times daily. For allergy and sinus -     levothyroxine (SYNTHROID) 88 MCG tablet; Take 1 tablet (88 mcg total) by mouth daily before breakfast. -     rosuvastatin (CRESTOR) 10 MG tablet; Take 1 tablet (10 mg total) by mouth daily.   Allergies as of 06/03/2023       Reactions   Celebrex [celecoxib]    Hydrochlorothiazide    Lisinopril Swelling   Sulfa Antibiotics    Had sores in my mouth, bottom  of my feet "the skin just came right off"        Medication List        Accurate as of June 03, 2023 11:59 PM. If you have any questions, ask your nurse or doctor.          albuterol 108 (90 Base) MCG/ACT inhaler Commonly known as: VENTOLIN HFA Inhale 2 puffs into the lungs every 6 (six) hours as needed for wheezing or shortness of breath.   ALPRAZolam 0.25 MG tablet Commonly  known as: XANAX Take 1 tablet (0.25 mg total) by mouth 2 (two) times daily.   amLODipine 5 MG tablet Commonly known as: NORVASC TAKE ONE (1) TABLET EACH DAY   colchicine 0.6 MG tablet TAKE 1 TABLET AT THE ONSET OF SYMPTOMS MAY REPEAT IN 2 HOURS IF NEEDED (MAX OF 2 TABLETS DAILY)   fexofenadine 60 MG tablet Commonly known as: ALLEGRA Take 1 tablet (60 mg total) by mouth 2 (two) times daily. For allergy and sinus   fluticasone 50 MCG/ACT nasal spray Commonly known as: FLONASE USE 2 SPRAYS IN EACH NOSTRIL DAILY   ipratropium-albuterol 0.5-2.5 (3) MG/3ML Soln Commonly known as: DUONEB NEBULIZE 1 VIAL EVERY 6 HOURS AS NEEDED   levothyroxine 88 MCG tablet Commonly known as: SYNTHROID Take 1 tablet (88 mcg total) by mouth daily before breakfast. What changed: See the new instructions. Changed by: Aleicia Kenagy   omeprazole 40 MG capsule Commonly known as: PRILOSEC TAKE ONE (1) CAPSULE EACH DAY   rosuvastatin 10 MG tablet Commonly known as: Crestor Take 1 tablet (10 mg total) by mouth daily.   Spacer/Aero Chamber Mouthpiece Misc 1 Units 4 (four) times daily by Does not apply route.   spironolactone 25 MG tablet Commonly known as: ALDACTONE TAKE ONE (1) TABLET BY MOUTH EVERY DAY        Meds ordered this encounter  Medications   ALPRAZolam (XANAX) 0.25 MG tablet    Sig: Take 1 tablet (0.25 mg total) by mouth 2 (two) times daily.    Dispense:  60 tablet    Refill:  5   amLODipine (NORVASC) 5 MG tablet    Sig: TAKE ONE (1) TABLET EACH DAY    Dispense:  90 tablet    Refill:  3    fexofenadine (ALLEGRA) 60 MG tablet    Sig: Take 1 tablet (60 mg total) by mouth 2 (two) times daily. For allergy and sinus    Dispense:  180 tablet    Refill:  3   levothyroxine (SYNTHROID) 88 MCG tablet    Sig: Take 1 tablet (88 mcg total) by mouth daily before breakfast.    Dispense:  90 tablet    Refill:  3   omeprazole (PRILOSEC) 40 MG capsule    Sig: TAKE ONE (1) CAPSULE EACH DAY    Dispense:  90 capsule    Refill:  3   rosuvastatin (CRESTOR) 10 MG tablet    Sig: Take 1 tablet (10 mg total) by mouth daily.    Dispense:  90 tablet    Refill:  3      Follow-up: Return in about 6 months (around 12/01/2023).  Mechele Claude, M.D.

## 2023-06-04 ENCOUNTER — Encounter: Payer: Self-pay | Admitting: Family Medicine

## 2023-06-04 ENCOUNTER — Ambulatory Visit (HOSPITAL_COMMUNITY)
Admission: RE | Admit: 2023-06-04 | Discharge: 2023-06-04 | Disposition: A | Discharge status: Home / Self Care | Payer: Medicare Other | Source: Ambulatory Visit | Attending: Family Medicine | Admitting: Family Medicine

## 2023-06-04 DIAGNOSIS — S0590XA Unspecified injury of unspecified eye and orbit, initial encounter: Secondary | ICD-10-CM | POA: Diagnosis not present

## 2023-06-04 DIAGNOSIS — S0512XA Contusion of eyeball and orbital tissues, left eye, initial encounter: Secondary | ICD-10-CM | POA: Diagnosis not present

## 2023-06-04 DIAGNOSIS — G319 Degenerative disease of nervous system, unspecified: Secondary | ICD-10-CM | POA: Diagnosis not present

## 2023-06-04 LAB — CMP14+EGFR
ALT: 11 [IU]/L (ref 0–32)
AST: 25 [IU]/L (ref 0–40)
Albumin: 4.8 g/dL — ABNORMAL HIGH (ref 3.7–4.7)
Alkaline Phosphatase: 121 [IU]/L (ref 44–121)
BUN/Creatinine Ratio: 13 (ref 12–28)
BUN: 17 mg/dL (ref 8–27)
Bilirubin Total: 0.9 mg/dL (ref 0.0–1.2)
CO2: 20 mmol/L (ref 20–29)
Calcium: 9.6 mg/dL (ref 8.7–10.3)
Chloride: 105 mmol/L (ref 96–106)
Creatinine, Ser: 1.28 mg/dL — ABNORMAL HIGH (ref 0.57–1.00)
Globulin, Total: 3 g/dL (ref 1.5–4.5)
Glucose: 94 mg/dL (ref 70–99)
Potassium: 4.4 mmol/L (ref 3.5–5.2)
Sodium: 143 mmol/L (ref 134–144)
Total Protein: 7.8 g/dL (ref 6.0–8.5)
eGFR: 42 mL/min/{1.73_m2} — ABNORMAL LOW (ref >59)

## 2023-06-04 LAB — LIPID PANEL
Chol/HDL Ratio: 3 ratio (ref 0.0–4.4)
Cholesterol, Total: 192 mg/dL (ref 100–199)
HDL: 63 mg/dL (ref >39)
LDL Chol Calc (NIH): 112 mg/dL — ABNORMAL HIGH (ref 0–99)
Triglycerides: 93 mg/dL (ref 0–149)
VLDL Cholesterol Cal: 17 mg/dL (ref 5–40)

## 2023-06-04 LAB — CBC WITH DIFFERENTIAL/PLATELET
Basophils Absolute: 0 10*3/uL (ref 0.0–0.2)
Basos: 1 %
EOS (ABSOLUTE): 0.2 10*3/uL (ref 0.0–0.4)
Eos: 3 %
Hematocrit: 35.1 % (ref 34.0–46.6)
Hemoglobin: 11.1 g/dL (ref 11.1–15.9)
Immature Grans (Abs): 0 10*3/uL (ref 0.0–0.1)
Immature Granulocytes: 0 %
Lymphocytes Absolute: 3 10*3/uL (ref 0.7–3.1)
Lymphs: 44 %
MCH: 29.2 pg (ref 26.6–33.0)
MCHC: 31.6 g/dL (ref 31.5–35.7)
MCV: 92 fL (ref 79–97)
Monocytes Absolute: 0.5 10*3/uL (ref 0.1–0.9)
Monocytes: 7 %
Neutrophils Absolute: 3.1 10*3/uL (ref 1.4–7.0)
Neutrophils: 45 %
Platelets: 214 10*3/uL (ref 150–450)
RBC: 3.8 x10E6/uL (ref 3.77–5.28)
RDW: 12.7 % (ref 11.7–15.4)
WBC: 6.8 10*3/uL (ref 3.4–10.8)

## 2023-06-04 LAB — TSH+FREE T4
Free T4: 1.59 ng/dL (ref 0.82–1.77)
TSH: 2.89 u[IU]/mL (ref 0.450–4.500)

## 2023-06-04 NOTE — Progress Notes (Signed)
Hello Christie Bruce,  Your lab result is normal and/or stable.Some minor variations that are not significant are commonly marked abnormal, but do not represent any medical problem for you.  Best regards, Quianna Avery, M.D.

## 2023-06-05 ENCOUNTER — Other Ambulatory Visit: Payer: Self-pay | Admitting: Family Medicine

## 2023-06-12 ENCOUNTER — Encounter: Payer: Self-pay | Admitting: Nurse Practitioner

## 2023-06-12 ENCOUNTER — Telehealth: Payer: Self-pay | Admitting: Family Medicine

## 2023-06-12 ENCOUNTER — Ambulatory Visit: Payer: Medicare Other | Admitting: Nurse Practitioner

## 2023-06-12 VITALS — BP 147/72 | HR 93 | Temp 98.2°F | Ht 62.0 in | Wt 137.6 lb

## 2023-06-12 DIAGNOSIS — S41111A Laceration without foreign body of right upper arm, initial encounter: Secondary | ICD-10-CM | POA: Diagnosis not present

## 2023-06-12 MED ORDER — CEPHALEXIN 500 MG PO CAPS: 500.0000 mg | ORAL_CAPSULE | Freq: Two times a day (BID) | ORAL | 0 refills | Status: DC

## 2023-06-12 NOTE — Telephone Encounter (Signed)
Copied from CRM [id number]. Topic: Clinical - Prescription Issue >> Jun 12, 2023 10:28 AM Christie Bruce wrote: Reason for CRM: PT's son states that his mother saw [name] today 06/12/23 and claims she said she would order antibiotics for his mom. PT's son states they went to their pharmacy and the prescription was not there.

## 2023-06-12 NOTE — Telephone Encounter (Signed)
Left detailed message.   
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
## 2023-06-17 DIAGNOSIS — M25531 Pain in right wrist: Secondary | ICD-10-CM | POA: Diagnosis not present

## 2023-06-24 ENCOUNTER — Ambulatory Visit: Payer: Medicare Other | Admitting: Obstetrics & Gynecology

## 2023-06-24 DIAGNOSIS — S52201D Unspecified fracture of shaft of right ulna, subsequent encounter for closed fracture with routine healing: Secondary | ICD-10-CM | POA: Diagnosis not present

## 2023-06-25 ENCOUNTER — Encounter: Payer: Self-pay | Admitting: Obstetrics & Gynecology

## 2023-06-25 ENCOUNTER — Ambulatory Visit: Payer: Medicare Other | Admitting: Obstetrics & Gynecology

## 2023-06-25 VITALS — BP 139/77 | HR 80 | Ht 62.0 in

## 2023-06-25 DIAGNOSIS — Z4689 Encounter for fitting and adjustment of other specified devices: Secondary | ICD-10-CM

## 2023-06-25 DIAGNOSIS — N813 Complete uterovaginal prolapse: Secondary | ICD-10-CM

## 2023-06-25 NOTE — Progress Notes (Signed)
Chief Complaint  Patient presents with   Pessary Check    Blood pressure 139/77, pulse 80, height 5\' 2"  (1.575 m).  Christie Bruce presents today for routine follow up related to her pessary.   She uses a Milex ring with support #2 She reports no vaginal discharge and no vaginal bleeding   Likert scale(1 not bothersome -5 very bothersome)  :  1  Exam reveals no undue vaginal mucosal pressure of breakdown, no discharge and no vaginal bleeding.  Vaginal Epithelial Abnormality Classification System:   0 0    No abnormalities 1    Epithelial erythema 2    Granulation tissue 3    Epithelial break or erosion, 1 cm or less 4    Epithelial break or erosion, 1 cm or greater  The pessary is removed, cleaned and replaced without difficulty.      ICD-10-CM   1. Pessary maintenance, Milex ring with support #2  Z46.89     2. Uterovaginal prolapse, complete  N81.3        Christie Bruce will be sen back in 4 months for continued follow up.  Lazaro Arms, MD  06/25/2023 10:38 AM

## 2023-07-11 DIAGNOSIS — S52201D Unspecified fracture of shaft of right ulna, subsequent encounter for closed fracture with routine healing: Secondary | ICD-10-CM | POA: Diagnosis not present

## 2023-08-01 DIAGNOSIS — S52201D Unspecified fracture of shaft of right ulna, subsequent encounter for closed fracture with routine healing: Secondary | ICD-10-CM | POA: Diagnosis not present

## 2023-08-22 DIAGNOSIS — M25631 Stiffness of right wrist, not elsewhere classified: Secondary | ICD-10-CM | POA: Diagnosis not present

## 2023-08-22 DIAGNOSIS — S52201D Unspecified fracture of shaft of right ulna, subsequent encounter for closed fracture with routine healing: Secondary | ICD-10-CM | POA: Diagnosis not present

## 2023-10-14 DIAGNOSIS — S52201D Unspecified fracture of shaft of right ulna, subsequent encounter for closed fracture with routine healing: Secondary | ICD-10-CM | POA: Diagnosis not present

## 2023-10-22 ENCOUNTER — Ambulatory Visit: Admitting: Obstetrics & Gynecology

## 2023-10-22 ENCOUNTER — Encounter: Payer: Self-pay | Admitting: Obstetrics & Gynecology

## 2023-10-22 VITALS — BP 139/67 | HR 78 | Ht 62.0 in | Wt 127.0 lb

## 2023-10-22 DIAGNOSIS — Z4689 Encounter for fitting and adjustment of other specified devices: Secondary | ICD-10-CM | POA: Diagnosis not present

## 2023-10-22 DIAGNOSIS — N813 Complete uterovaginal prolapse: Secondary | ICD-10-CM

## 2023-10-22 NOTE — Progress Notes (Signed)
 Chief Complaint  Patient presents with   Pessary Check    Blood pressure 139/67, pulse 78, height 5\' 2"  (1.575 m), weight 127 lb (57.6 kg).  Christie Bruce presents today for routine follow up related to her pessary.   She uses a Milex ring with support #2 She reports little vaginal discharge and no vaginal bleeding   Likert scale(1 not bothersome -5 very bothersome)  :  1  Exam reveals no undue vaginal mucosal pressure of breakdown, no discharge and no vaginal bleeding.  Vaginal Epithelial Abnormality Classification System:   0 0    No abnormalities 1    Epithelial erythema 2    Granulation tissue 3    Epithelial break or erosion, 1 cm or less 4    Epithelial break or erosion, 1 cm or greater  The pessary is removed, cleaned and replaced without difficulty.      ICD-10-CM   1. Pessary maintenance, Milex ring with support #2, OF 11/2012  Z46.89     2. Uterovaginal prolapse, complete  N81.3        Christie Bruce will be sen back in 4 months for continued follow up.  Lazaro Arms, MD  10/22/2023 9:29 AM

## 2023-10-28 ENCOUNTER — Other Ambulatory Visit: Payer: Self-pay | Admitting: Family Medicine

## 2023-11-25 ENCOUNTER — Ambulatory Visit (INDEPENDENT_AMBULATORY_CARE_PROVIDER_SITE_OTHER)

## 2023-11-25 VITALS — Ht 62.0 in | Wt 127.0 lb

## 2023-11-25 DIAGNOSIS — Z0000 Encounter for general adult medical examination without abnormal findings: Secondary | ICD-10-CM

## 2023-11-25 DIAGNOSIS — Z780 Asymptomatic menopausal state: Secondary | ICD-10-CM

## 2023-11-25 NOTE — Progress Notes (Signed)
 Subjective:   Christie Bruce is a 83 y.o. who presents for a Medicare Wellness preventive visit.  Visit Complete: Virtual I connected with  Boston Byers on 11/25/23 by a audio enabled telemedicine application and verified that I am speaking with the correct person using two identifiers.  Patient Location: Home  Provider Location: Home Office  I discussed the limitations of evaluation and management by telemedicine. The patient expressed understanding and agreed to proceed.  Vital Signs: Because this visit was a virtual/telehealth visit, some criteria may be missing or patient reported. Any vitals not documented were not able to be obtained and vitals that have been documented are patient reported.  VideoDeclined- This patient declined Librarian, academic. Therefore the visit was completed with audio only.  Persons Participating in Visit: Patient.  AWV Questionnaire: No: Patient Medicare AWV questionnaire was not completed prior to this visit.  Cardiac Risk Factors include: advanced age (>82men, >82 women);hypertension     Objective:    Today's Vitals   11/25/23 1124  Weight: 127 lb (57.6 kg)  Height: 5\' 2"  (1.575 m)   Body mass index is 23.23 kg/m.     11/25/2023   11:28 AM 10/03/2022   10:02 AM 07/20/2021    3:18 PM 02/08/2017    9:07 AM 09/10/2016   11:37 AM 02/19/2016    6:22 PM 02/13/2016    9:41 AM  Advanced Directives  Does Patient Have a Medical Advance Directive? No Yes No No No No No  Type of Advance Directive  Living will;Healthcare Power of Attorney       Does patient want to make changes to medical advance directive?  No - Patient declined       Copy of Healthcare Power of Attorney in Chart?  No - copy requested       Would patient like information on creating a medical advance directive? Yes (MAU/Ambulatory/Procedural Areas - Information given)  No - Patient declined   No - patient declined information No - patient declined  information    Current Medications (verified) Outpatient Encounter Medications as of 11/25/2023  Medication Sig   albuterol  (PROVENTIL  HFA;VENTOLIN  HFA) 108 (90 Base) MCG/ACT inhaler Inhale 2 puffs into the lungs every 6 (six) hours as needed for wheezing or shortness of breath.   ALPRAZolam  (XANAX ) 0.25 MG tablet Take 1 tablet (0.25 mg total) by mouth 2 (two) times daily.   amLODipine  (NORVASC ) 5 MG tablet TAKE ONE (1) TABLET EACH DAY   colchicine  0.6 MG tablet TAKE 1 TABLET AT THE ONSET OF SYMPTOMS MAY REPEAT IN 2 HOURS IF NEEDED (MAX OF 2 TABLETS DAILY)   fexofenadine  (ALLEGRA ) 60 MG tablet Take 1 tablet (60 mg total) by mouth 2 (two) times daily. For allergy and sinus   fluticasone  (FLONASE ) 50 MCG/ACT nasal spray USE 2 SPRAYS IN EACH NOSTRIL DAILY   ipratropium-albuterol  (DUONEB) 0.5-2.5 (3) MG/3ML SOLN NEBULIZE 1 VIAL EVERY 6 HOURS AS NEEDED   levothyroxine  (SYNTHROID ) 88 MCG tablet Take 1 tablet (88 mcg total) by mouth daily before breakfast.   omeprazole  (PRILOSEC) 40 MG capsule TAKE ONE (1) CAPSULE EACH DAY   rosuvastatin  (CRESTOR ) 10 MG tablet Take 1 tablet (10 mg total) by mouth daily.   Spacer/Aero Chamber Mouthpiece MISC 1 Units 4 (four) times daily by Does not apply route.   spironolactone  (ALDACTONE ) 25 MG tablet TAKE ONE (1) TABLET BY MOUTH EVERY DAY   traMADol  (ULTRAM ) 50 MG tablet Take by mouth.   [DISCONTINUED] cephALEXin  (  KEFLEX ) 500 MG capsule Take 1 capsule (500 mg total) by mouth 2 (two) times daily.   No facility-administered encounter medications on file as of 11/25/2023.    Allergies (verified) Celebrex [celecoxib], Hydrochlorothiazide , Lisinopril , and Sulfa antibiotics   History: Past Medical History:  Diagnosis Date   Asthma    small issue and uses inhaler if needed   Breast cancer (HCC)    Chronic kidney disease    DCIS (ductal carcinoma in situ) of breast 09/12/2011   GERD (gastroesophageal reflux disease) 09/12/2011   Gout    Heart murmur    Hypertension     PONV (postoperative nausea and vomiting)    Thyroid  disease    Uterine prolapse    Past Surgical History:  Procedure Laterality Date   bilateral mastectomy  07/19/10   bilat mastectomy for DCIS   CHOLECYSTECTOMY N/A 02/13/2016   Procedure: LAPAROSCOPIC CHOLECYSTECTOMY;  Surgeon: Alanda Allegra, MD;  Location: [facility];  Service: General;  Laterality: N/A;   HIP SURGERY     INTRAMEDULLARY (IM) NAIL INTERTROCHANTERIC Left 12/28/2015   Procedure: INTRAMEDULLARY (IM) NAIL LEFT HIP;  Surgeon: Saundra Curl, MD;  Location: [facility];  Service: Orthopedics;  Laterality: Left;   KNEE SURGERY Left    WRIST SURGERY Left    Family History  Problem Relation Age of Onset   Cancer Mother        melanoma   Stroke Mother    Cancer Father        prostate cancer   Heart disease Father    Hypertension Father    Cancer Brother        lung cancer   Hypertension Sister    Heart attack Brother    Diabetes Brother    Diabetes Brother    Cancer Paternal Uncle        lung cancer   Social History   Socioeconomic History   Marital status: Widowed    Spouse name: Not on file   Number of children: 3   Years of education: 9   Highest education level: 9th grade  Occupational History   Not on file  Tobacco Use   Smoking status: Former    Current packs/day: 0.00    Average packs/day: 0.3 packs/day for 15.0 years (3.8 ttl pk-yrs)    Types: Cigarettes    Start date: 26    Quit date: 89    Years since quitting: 28.3   Smokeless tobacco: Never  Vaping Use   Vaping status: Never Used  Substance and Sexual Activity   Alcohol use: No   Drug use: No   Sexual activity: Not Currently    Birth control/protection: Post-menopausal  Other Topics Concern   Not on file  Social History Narrative   Not on file   Social Drivers of Health   Financial Resource Strain: Low Risk  (11/25/2023)   Overall Financial Resource Strain (CARDIA)    Difficulty of Paying Living Expenses: Not hard at all  Food  Insecurity: No Food Insecurity (11/25/2023)   Hunger Vital Sign    Worried About Running Out of Food in the Last Year: Never true    Ran Out of Food in the Last Year: Never true  Transportation Needs: No Transportation Needs (11/25/2023)   PRAPARE - Administrator, Civil Service (Medical): No    Lack of Transportation (Non-Medical): No  Physical Activity: Sufficiently Active (11/25/2023)   Exercise Vital Sign    Days of Exercise per Week: 5 days  Minutes of Exercise per Session: 30 min  Stress: No Stress Concern Present (11/25/2023)   [employer] of Occupational Health - Occupational Stress Questionnaire    Feeling of Stress : Not at all  Social Connections: Moderately Integrated (11/25/2023)   Social Connection and Isolation Panel [NHANES]    Frequency of Communication with Friends and Family: More than three times a week    Frequency of Social Gatherings with Friends and Family: More than three times a week    Attends Religious Services: More than 4 times per year    Active Member of [employer] or Organizations: Yes    Attends Banker Meetings: More than 4 times per year    Marital Status: Widowed    Tobacco Counseling Counseling given: Not Answered    Clinical Intake:  Pre-visit preparation completed: Yes  Pain : No/denies pain     Diabetes: No  Lab Results  Component Value Date   HGBA1C 5.4 02/07/2016     How often do you need to have someone help you when you read instructions, pamphlets, or other written materials from your doctor or pharmacy?: 1 - Never  Interpreter Needed?: No  Information entered by :: Seabron Cypress LPN   Activities of Daily Living     11/25/2023   11:28 AM  In your present state of health, do you have any difficulty performing the following activities:  Hearing? 0  Vision? 0  Difficulty concentrating or making decisions? 0  Walking or climbing stairs? 1  Dressing or bathing? 0  Doing errands, shopping? 1   Preparing Food and eating ? N  Using the Toilet? N  In the past six months, have you accidently leaked urine? N  Do you have problems with loss of bowel control? N  Managing your Medications? N  Managing your Finances? N  Housekeeping or managing your Housekeeping? N    Patient Care Team: Roise Cleaver, MD as PCP - General (Family Medicine) Wendelyn Halter, MD as Consulting Physician (Obstetrics and Gynecology) Marlena Sima, MD as Consulting Physician (Orthopedic Surgery)  Indicate any recent Medical Services you may have received from other than [facility] providers in the past year (date may be approximate).     Assessment:   This is a routine wellness examination for Anuhea.  Hearing/Vision screen Hearing Screening - Comments:: Denies hearing difficulties   Vision Screening - Comments:: No vision problems; will schedule routine eye exam soon     Goals Addressed               This Visit's Progress     Patient Stated (pt-stated)   On track     "I just want to stay healthy"       Depression Screen     11/25/2023   11:27 AM 06/03/2023   11:24 AM 06/03/2023   11:13 AM 10/17/2022    4:55 PM 10/17/2022   11:29 AM 10/03/2022   10:00 AM 03/14/2022    3:47 PM  PHQ 2/9 Scores  PHQ - 2 Score 0 0 0 0 0 0 0  PHQ- 9 Score    2       Fall Risk     11/25/2023   11:28 AM 06/03/2023   11:24 AM 06/03/2023   11:13 AM 10/17/2022   11:29 AM 10/03/2022    9:54 AM  Fall Risk   Falls in the past year? 0 1 0 0 0  Number falls in past yr: 0 0   0  Injury with Fall? 0 1   0  Risk for fall due to : Impaired mobility;Impaired balance/gait History of fall(s);Impaired balance/gait   No Fall Risks  Follow up Falls prevention discussed;Education provided;Falls evaluation completed Falls evaluation completed   Falls prevention discussed;Education provided;Falls evaluation completed    MEDICARE RISK AT HOME:  Medicare Risk at Home Any stairs in or around the home?: No If so, are there any  without handrails?: No Home free of loose throw rugs in walkways, pet beds, electrical cords, etc?: Yes Adequate lighting in your home to reduce risk of falls?: Yes Life alert?: No Use of a cane, walker or w/c?: Yes Grab bars in the bathroom?: Yes Shower chair or bench in shower?: No Elevated toilet seat or a handicapped toilet?: Yes  TIMED UP AND GO:  Was the test performed?  No   Cognitive Function: 6CIT completed        11/25/2023   11:28 AM 10/03/2022   10:02 AM 07/20/2021    3:23 PM  6CIT Screen  What Year? 0 points 0 points 0 points  What month? 0 points 0 points 0 points  What time? 0 points 0 points 0 points  Count back from 20 0 points 0 points 0 points  Months in reverse 2 points 2 points 4 points  Repeat phrase 0 points 2 points 2 points  Total Score 2 points 4 points 6 points    Immunizations Immunization History  Administered Date(s) Administered   Influenza, High Dose Seasonal PF 07/29/2017   Influenza,inj,Quad PF,6+ Mos 05/03/2014, 05/13/2015, 06/02/2018   Pneumococcal Conjugate-13 09/03/2014    Screening Tests Health Maintenance  Topic Date Due   COVID-19 Vaccine (1) Never done   Zoster Vaccines- Shingrix (1 of 2) Never done   Pneumonia Vaccine 56+ Years old (2 of 2 - PPSV23) 10/29/2014   DEXA SCAN  07/28/2023   INFLUENZA VACCINE  02/21/2024   Medicare Annual Wellness (AWV)  11/24/2024   HPV VACCINES  Aged Out   Meningococcal B Vaccine  Aged Out   DTaP/Tdap/Td  Discontinued    Health Maintenance  Health Maintenance Due  Topic Date Due   COVID-19 Vaccine (1) Never done   Zoster Vaccines- Shingrix (1 of 2) Never done   Pneumonia Vaccine 26+ Years old (2 of 2 - PPSV23) 10/29/2014   DEXA SCAN  07/28/2023   Health Maintenance Items Addressed: DEXA ordered  Additional Screening:  Vision Screening: Recommended annual ophthalmology exams for early detection of glaucoma and other disorders of the eye.  Dental Screening: Recommended annual  dental exams for proper oral hygiene  Community Resource Referral / Chronic Care Management: CRR required this visit?  No   CCM required this visit?  No     Plan:     I have personally reviewed and noted the following in the patient's chart:   Medical and social history Use of alcohol, tobacco or illicit drugs  Current medications and supplements including opioid prescriptions. Patient is not currently taking opioid prescriptions. Functional ability and status Nutritional status Physical activity Advanced directives List of other physicians Hospitalizations, surgeries, and ER visits in previous 12 months Vitals Screenings to include cognitive, depression, and falls Referrals and appointments  In addition, I have reviewed and discussed with patient certain preventive protocols, quality metrics, and best practice recommendations. A written personalized care plan for preventive services as well as general preventive health recommendations were provided to patient.     Seabron Cypress [place], [place]   03/28/453  After Visit Summary: (MyChart) Due to this being a telephonic visit, the after visit summary with patients personalized plan was offered to patient via MyChart   Notes: Nothing significant to report at this time.

## 2023-11-25 NOTE — Patient Instructions (Signed)
 Christie Bruce , Thank you for taking time to come for your Medicare Wellness Visit. I appreciate your ongoing commitment to your health goals. Please review the following plan we discussed and let me know if I can assist you in the future.   Referrals/Orders/Follow-Ups/Clinician Recommendations: Aim for 30 minutes of exercise or brisk walking, 6-8 glasses of water, and 5 servings of fruits and vegetables each day.  This is a list of the screening recommended for you and due dates:  Health Maintenance  Topic Date Due   COVID-19 Vaccine (1) Never done   Zoster (Shingles) Vaccine (1 of 2) Never done   Pneumonia Vaccine (2 of 2 - PPSV23) 10/29/2014   DEXA scan (bone density measurement)  07/28/2023   Flu Shot  02/21/2024   Medicare Annual Wellness Visit  11/24/2024   HPV Vaccine  Aged Out   Meningitis B Vaccine  Aged Out   DTaP/Tdap/Td vaccine  Discontinued    Advanced directives: (ACP Link)Information on Advanced Care Planning can be found at [place]  Secretary of [employer] [facility] [place] Advance Health Care Directives Advance Health Care Directives. [email or web address]   Next Medicare Annual Wellness Visit scheduled for next year: Yes  Have you seen your provider in the last 6 months (3 months if uncontrolled diabetes)? Yes

## 2023-12-02 ENCOUNTER — Encounter: Payer: Self-pay | Admitting: Family Medicine

## 2023-12-02 ENCOUNTER — Ambulatory Visit (INDEPENDENT_AMBULATORY_CARE_PROVIDER_SITE_OTHER): Payer: Medicare Other | Admitting: Family Medicine

## 2023-12-02 VITALS — BP 134/72 | HR 75 | Temp 97.8°F | Wt 124.0 lb

## 2023-12-02 DIAGNOSIS — D649 Anemia, unspecified: Secondary | ICD-10-CM

## 2023-12-02 DIAGNOSIS — F419 Anxiety disorder, unspecified: Secondary | ICD-10-CM

## 2023-12-02 DIAGNOSIS — E039 Hypothyroidism, unspecified: Secondary | ICD-10-CM

## 2023-12-02 DIAGNOSIS — I10 Essential (primary) hypertension: Secondary | ICD-10-CM | POA: Diagnosis not present

## 2023-12-02 DIAGNOSIS — Z79899 Other long term (current) drug therapy: Secondary | ICD-10-CM | POA: Diagnosis not present

## 2023-12-02 DIAGNOSIS — E782 Mixed hyperlipidemia: Secondary | ICD-10-CM | POA: Diagnosis not present

## 2023-12-02 MED ORDER — ALPRAZOLAM 0.25 MG PO TABS: 0.2500 mg | ORAL_TABLET | Freq: Two times a day (BID) | ORAL | 5 refills | Status: DC

## 2023-12-02 MED ORDER — SPIRONOLACTONE 25 MG PO TABS: 25.0000 mg | ORAL_TABLET | Freq: Every day | ORAL | 1 refills | Status: DC

## 2023-12-02 NOTE — Progress Notes (Signed)
 Subjective:  Patient ID: Christie Bruce, female    DOB: May 23, 1941  Age: 83 y.o. MRN: [id number]  CC: Medical Management of Chronic Issues (No concerns at this time.)   HPI Christie Bruce presents for  follow-up on  thyroid . The patient has a history of hypothyroidism for many years. It has been stable recently. Pt. denies any change in  voice, loss of hair, heat or cold intolerance. Energy level has been adequate to good. Patient denies constipation and diarrhea. No myxedema. Medication is as noted below. Verified that pt is taking it daily on an empty stomach. Well tolerated.   presents for  follow-up of hypertension. Patient has no history of headache chest pain or shortness of breath or recent cough. Patient also denies symptoms of TIA such as focal numbness or weakness. Patient denies side effects from medication. States taking it regularly.     12/02/2023    9:13 AM 10/17/2022    4:56 PM 03/14/2022    3:48 PM  GAD 7 : Generalized Anxiety Score  Nervous, Anxious, on Edge 0 1 1  Control/stop worrying 0 2 1  Worry too much - different things 0  0  Trouble relaxing 0 0 0  Restless 0 1 0  Easily annoyed or irritable 0 1 0  Afraid - awful might happen 0 2 0  Total GAD 7 Score 0  2  Anxiety Difficulty Not difficult at all Somewhat difficult Not difficult at all          12/02/2023    9:13 AM 11/25/2023   11:27 AM 06/03/2023   11:24 AM  Depression screen PHQ 2/9  Decreased Interest 0 0 0  Down, Depressed, Hopeless 0 0 0  PHQ - 2 Score 0 0 0  Altered sleeping 0    Tired, decreased energy 0    Change in appetite 0    Feeling bad or failure about yourself  0    Trouble concentrating 0    Moving slowly or fidgety/restless 0    Suicidal thoughts 0    PHQ-9 Score 0    Difficult doing work/chores Not difficult at all      History Christie Bruce has a past medical history of Asthma, Breast cancer (HCC), Chronic kidney disease, DCIS (ductal carcinoma in situ) of breast (09/12/2011), GERD  (gastroesophageal reflux disease) (09/12/2011), Gout, Heart murmur, Hypertension, PONV (postoperative nausea and vomiting), Thyroid  disease, and Uterine prolapse.   She has a past surgical history that includes bilateral mastectomy (07/19/10); Knee surgery (Left); Wrist surgery (Left); Intramedullary (im) nail intertrochanteric (Left, 12/28/2015); Cholecystectomy (N/A, 02/13/2016); and Hip surgery.   Her family history includes Cancer in her brother, father, mother, and paternal uncle; Diabetes in her brother and brother; Heart attack in her brother; Heart disease in her father; Hypertension in her father and sister; Stroke in her mother.She reports that she quit smoking about 28 years ago. Her smoking use included cigarettes. She started smoking about 43 years ago. She has a 3.8 pack-year smoking history. She has never used smokeless tobacco. She reports that she does not drink alcohol and does not use drugs.    ROS Review of Systems  Constitutional: Negative.   HENT: Negative.    Eyes:  Negative for visual disturbance.  Respiratory:  Negative for shortness of breath.   Cardiovascular:  Negative for chest pain.  Gastrointestinal:  Negative for abdominal pain.  Musculoskeletal:  Negative for arthralgias.    Objective:  BP 134/72   Pulse 75  Temp 97.8 F (36.6 C)   Wt 124 lb (56.2 kg)   SpO2 98%   BMI 22.68 kg/m   BP Readings from Last 3 Encounters:  12/02/23 134/72  10/22/23 139/67  06/25/23 139/77    Wt Readings from Last 3 Encounters:  12/02/23 124 lb (56.2 kg)  11/25/23 127 lb (57.6 kg)  10/22/23 127 lb (57.6 kg)     Physical Exam Constitutional:      General: She is not in acute distress.    Appearance: She is well-developed.  Cardiovascular:     Rate and Rhythm: Normal rate and regular rhythm.  Pulmonary:     Breath sounds: Normal breath sounds.  Musculoskeletal:        General: Normal range of motion.  Skin:    General: Skin is warm and dry.  Neurological:      Mental Status: She is alert and oriented to person, place, and time.      Assessment & Plan:  Hypothyroidism, unspecified type -     TSH + free T4  Anxiety -     ALPRAZolam ; Take 1 tablet (0.25 mg total) by mouth 2 (two) times daily.  Dispense: 60 tablet; Refill: 5  Essential hypertension -     CMP14+EGFR  Anemia, unspecified type  Controlled substance agreement signed -     ALPRAZolam ; Take 1 tablet (0.25 mg total) by mouth 2 (two) times daily.  Dispense: 60 tablet; Refill: 5 -     Drug Screen 10 W/Conf, Serum  Mixed hyperlipidemia -     Lipid panel  Other orders -     Spironolactone ; Take 1 tablet (25 mg total) by mouth daily.  Dispense: 30 tablet; Refill: 1     Follow-up: No follow-ups on file.  Christie Bruce, M.D.

## 2023-12-03 ENCOUNTER — Ambulatory Visit: Payer: Self-pay | Admitting: Family Medicine

## 2023-12-03 NOTE — Progress Notes (Signed)
Hello Aaliah,  Your lab result is normal and/or stable.Some minor variations that are not significant are commonly marked abnormal, but do not represent any medical problem for you.  Best regards, Quianna Avery, M.D.

## 2023-12-05 LAB — CMP14+EGFR
ALT: 11 IU/L (ref 0–32)
AST: 22 IU/L (ref 0–40)
Albumin: 4.8 g/dL — ABNORMAL HIGH (ref 3.7–4.7)
Alkaline Phosphatase: 136 IU/L — ABNORMAL HIGH (ref 44–121)
BUN/Creatinine Ratio: 13 (ref 12–28)
BUN: 18 mg/dL (ref 8–27)
Bilirubin Total: 0.7 mg/dL (ref 0.0–1.2)
CO2: 20 mmol/L (ref 20–29)
Calcium: 9.6 mg/dL (ref 8.7–10.3)
Chloride: 103 mmol/L (ref 96–106)
Creatinine, Ser: 1.36 mg/dL — ABNORMAL HIGH (ref 0.57–1.00)
Globulin, Total: 2.9 g/dL (ref 1.5–4.5)
Glucose: 87 mg/dL (ref 70–99)
Potassium: 5.2 mmol/L (ref 3.5–5.2)
Sodium: 140 mmol/L (ref 134–144)
Total Protein: 7.7 g/dL (ref 6.0–8.5)
eGFR: 39 mL/min/{1.73_m2} — ABNORMAL LOW (ref >59)

## 2023-12-05 LAB — DRUG SCREEN 10 W/CONF, SERUM

## 2023-12-05 LAB — LIPID PANEL
Chol/HDL Ratio: 2.7 ratio (ref 0.0–4.4)
Cholesterol, Total: 162 mg/dL (ref 100–199)
HDL: 59 mg/dL (ref >39)
LDL Chol Calc (NIH): 86 mg/dL (ref 0–99)
Triglycerides: 92 mg/dL (ref 0–149)
VLDL Cholesterol Cal: 17 mg/dL (ref 5–40)

## 2023-12-05 LAB — TSH+FREE T4
Free T4: 1.35 ng/dL (ref 0.82–1.77)
TSH: 2.49 u[IU]/mL (ref 0.450–4.500)

## 2023-12-12 DIAGNOSIS — M79631 Pain in right forearm: Secondary | ICD-10-CM | POA: Diagnosis not present

## 2023-12-12 DIAGNOSIS — S52201D Unspecified fracture of shaft of right ulna, subsequent encounter for closed fracture with routine healing: Secondary | ICD-10-CM | POA: Diagnosis not present

## 2024-02-10 ENCOUNTER — Other Ambulatory Visit: Payer: Self-pay | Admitting: Family Medicine

## 2024-02-10 DIAGNOSIS — J301 Allergic rhinitis due to pollen: Secondary | ICD-10-CM

## 2024-02-11 ENCOUNTER — Encounter: Payer: Self-pay | Admitting: Obstetrics & Gynecology

## 2024-02-11 ENCOUNTER — Ambulatory Visit: Admitting: Obstetrics & Gynecology

## 2024-02-11 VITALS — BP 161/65 | HR 86 | Ht 62.0 in | Wt 126.5 lb

## 2024-02-11 DIAGNOSIS — Z4689 Encounter for fitting and adjustment of other specified devices: Secondary | ICD-10-CM

## 2024-02-11 DIAGNOSIS — N813 Complete uterovaginal prolapse: Secondary | ICD-10-CM

## 2024-02-11 NOTE — Progress Notes (Signed)
 Chief Complaint  Patient presents with   Pessary Check    Blood pressure (!) 161/70, pulse 70, height 5' 2 (1.575 m), weight 126 lb 8 oz (57.4 kg).  Christie Bruce presents today for routine follow up related to her pessary.   She uses a Milex ring with support #2 She reports no vaginal discharge and no vaginal bleeding   Likert scale(1 not bothersome -5 very bothersome)  :  1  Exam reveals no undue vaginal mucosal pressure of breakdown, no discharge and no vaginal bleeding.  Vaginal Epithelial Abnormality Classification System:   0 0    No abnormalities 1    Epithelial erythema 2    Granulation tissue 3    Epithelial break or erosion, 1 cm or less 4    Epithelial break or erosion, 1 cm or greater  The pessary is removed, cleaned and replaced without difficulty.      ICD-10-CM   1. Pessary maintenance, Milex ring with support #2, OF 11/2012  Z46.89     2. Uterovaginal prolapse, complete  N81.3        Christie Bruce will be sen back in 4 months for continued follow up.  Vonn VEAR Inch, MD  02/11/2024 9:21 AM

## 2024-03-16 ENCOUNTER — Other Ambulatory Visit: Payer: Self-pay | Admitting: Family Medicine

## 2024-04-14 ENCOUNTER — Other Ambulatory Visit: Payer: Self-pay | Admitting: Family Medicine

## 2024-04-14 DIAGNOSIS — K219 Gastro-esophageal reflux disease without esophagitis: Secondary | ICD-10-CM

## 2024-04-14 DIAGNOSIS — I10 Essential (primary) hypertension: Secondary | ICD-10-CM

## 2024-06-03 ENCOUNTER — Ambulatory Visit

## 2024-06-03 ENCOUNTER — Ambulatory Visit: Payer: Self-pay | Admitting: Family Medicine

## 2024-06-03 ENCOUNTER — Encounter: Payer: Self-pay | Admitting: Family Medicine

## 2024-06-03 VITALS — BP 135/62 | HR 71 | Temp 97.7°F | Ht 62.0 in | Wt 127.0 lb

## 2024-06-03 DIAGNOSIS — N1832 Chronic kidney disease, stage 3b: Secondary | ICD-10-CM

## 2024-06-03 DIAGNOSIS — Z79899 Other long term (current) drug therapy: Secondary | ICD-10-CM

## 2024-06-03 DIAGNOSIS — E039 Hypothyroidism, unspecified: Secondary | ICD-10-CM | POA: Diagnosis not present

## 2024-06-03 DIAGNOSIS — I10 Essential (primary) hypertension: Secondary | ICD-10-CM

## 2024-06-03 DIAGNOSIS — E782 Mixed hyperlipidemia: Secondary | ICD-10-CM

## 2024-06-03 DIAGNOSIS — M150 Primary generalized (osteo)arthritis: Secondary | ICD-10-CM

## 2024-06-03 DIAGNOSIS — F419 Anxiety disorder, unspecified: Secondary | ICD-10-CM | POA: Diagnosis not present

## 2024-06-03 MED ORDER — PREGABALIN 75 MG PO CAPS: 75.0000 mg | ORAL_CAPSULE | Freq: Every day | ORAL | 1 refills | Status: AC

## 2024-06-03 MED ORDER — ALPRAZOLAM 0.25 MG PO TABS: 0.2500 mg | ORAL_TABLET | Freq: Two times a day (BID) | ORAL | 5 refills | Status: AC

## 2024-06-03 MED ORDER — LEVOTHYROXINE SODIUM 88 MCG PO TABS: 88.0000 ug | ORAL_TABLET | Freq: Every day | ORAL | 3 refills | Status: AC

## 2024-06-03 MED ORDER — ROSUVASTATIN CALCIUM 10 MG PO TABS: 10.0000 mg | ORAL_TABLET | Freq: Every day | ORAL | 3 refills | Status: AC

## 2024-06-03 MED ORDER — FEXOFENADINE HCL 60 MG PO TABS: 60.0000 mg | ORAL_TABLET | Freq: Two times a day (BID) | ORAL | 3 refills | Status: AC

## 2024-06-03 NOTE — Progress Notes (Signed)
 Subjective:  Patient ID: Christie Bruce, female    DOB: 08-01-1940  Age: 83 y.o. MRN: [id number]  CC: Medical Management of Chronic Issues (Cold weather making her stiff and aching. Pt has arthritis as well. Slow moving and getting hard to get around. Hips and back is the worst. )   HPI  Discussed the use of AI scribe software for clinical note transcription with the patient, who gave verbal consent to proceed.  History of Present Illness Christie Bruce is an 83 year old female with arthritis who presents with stiffness and difficulty moving.  She experiences significant stiffness and difficulty moving, particularly in cold weather. It often takes multiple attempts to get out of a chair, especially in the kitchen. She attributes some of her mobility issues to past injuries, including chipping her tailbone twice and breaking a hip. She reports having arthritis in her knees and has had surgery to scrape out arthritis.  She is allergic to certain arthritis medications, including Zolprix, which previously caused severe reactions such as skin peeling from her mouth and feet. Due to these allergies and her weak kidneys, she avoids taking most medications, including Tylenol . She is skeptical about medications due to past adverse reactions, including an incident where she had difficulty speaking after taking a medication prescribed by a kidney specialist.  She currently takes alprazolam  (Xanax ) as needed for anxiety, particularly when she feels nervous or has to visit certain doctors. She takes one tablet daily, sometimes two, to help keep her calm. She has stopped taking rosuvastatin  for cholesterol management but is open to resuming it if necessary.  Her son has encouraged her to discuss her stiffness with her doctor. Despite her stiffness, she maintains an active lifestyle, driving and doing her own grocery shopping. She enjoys keeping her mind active with puzzles and other activities.  Reports  stiffness primarily in her back and knees.          12/02/2023    9:13 AM 11/25/2023   11:27 AM 06/03/2023   11:24 AM  Depression screen PHQ 2/9  Decreased Interest 0 0 0  Down, Depressed, Hopeless 0 0 0  PHQ - 2 Score 0 0 0  Altered sleeping 0    Tired, decreased energy 0    Change in appetite 0    Feeling bad or failure about yourself  0    Trouble concentrating 0    Moving slowly or fidgety/restless 0    Suicidal thoughts 0    PHQ-9 Score 0     Difficult doing work/chores Not difficult at all       Data saved with a previous flowsheet row definition    History Cystal has a past medical history of Asthma, Breast cancer (HCC), Chronic kidney disease, DCIS (ductal carcinoma in situ) of breast (09/12/2011), GERD (gastroesophageal reflux disease) (09/12/2011), Gout, Heart murmur, Hypertension, PONV (postoperative nausea and vomiting), Thyroid  disease, and Uterine prolapse.   She has a past surgical history that includes bilateral mastectomy (07/19/10); Knee surgery (Left); Wrist surgery (Left); Intramedullary (im) nail intertrochanteric (Left, 12/28/2015); Cholecystectomy (N/A, 02/13/2016); and Hip surgery.   Her family history includes Cancer in her brother, father, mother, and paternal uncle; Diabetes in her brother and brother; Heart attack in her brother; Heart disease in her father; Hypertension in her father and sister; Stroke in her mother.She reports that she quit smoking about 28 years ago. Her smoking use included cigarettes. She started smoking about 43 years ago. She has a 3.8 pack-year  smoking history. She has never used smokeless tobacco. She reports that she does not drink alcohol and does not use drugs.    ROS Review of Systems  Constitutional: Negative.   HENT:  Negative for congestion.   Eyes:  Negative for visual disturbance.  Respiratory:  Negative for shortness of breath.   Cardiovascular:  Negative for chest pain.  Gastrointestinal:  Negative for abdominal pain,  constipation, diarrhea, nausea and vomiting.  Genitourinary:  Negative for difficulty urinating.  Musculoskeletal:  Negative for arthralgias and myalgias.  Neurological:  Negative for headaches.  Psychiatric/Behavioral:  Negative for sleep disturbance.     Objective:  BP 135/62   Pulse 71   Temp 97.7 F (36.5 C)   Ht 5' 2 (1.575 m)   Wt 127 lb (57.6 kg)   SpO2 98%   BMI 23.23 kg/m   BP Readings from Last 3 Encounters:  06/03/24 135/62  02/11/24 (!) 161/65  12/02/23 134/72    Wt Readings from Last 3 Encounters:  06/03/24 127 lb (57.6 kg)  02/11/24 126 lb 8 oz (57.4 kg)  12/02/23 124 lb (56.2 kg)     Physical Exam Constitutional:      General: She is not in acute distress.    Appearance: She is well-developed.  HENT:     Head: Normocephalic and atraumatic.  Eyes:     Conjunctiva/sclera: Conjunctivae normal.     Pupils: Pupils are equal, round, and reactive to light.  Neck:     Thyroid : No thyromegaly.  Cardiovascular:     Rate and Rhythm: Normal rate and regular rhythm.     Heart sounds: Normal heart sounds. No murmur heard. Pulmonary:     Effort: Pulmonary effort is normal. No respiratory distress.     Breath sounds: Normal breath sounds. No wheezing or rales.  Abdominal:     General: Bowel sounds are normal. There is no distension.     Palpations: Abdomen is soft.     Tenderness: There is no abdominal tenderness.  Musculoskeletal:        General: Normal range of motion.     Cervical back: Normal range of motion and neck supple.  Lymphadenopathy:     Cervical: No cervical adenopathy.  Skin:    General: Skin is warm and dry.  Neurological:     Mental Status: She is alert and oriented to person, place, and time.  Psychiatric:        Behavior: Behavior normal.        Thought Content: Thought content normal.        Judgment: Judgment normal.    Physical Exam GENERAL: Alert, cooperative, well developed, no acute distress HEENT: Normocephalic, normal  oropharynx, moist mucous membranes CHEST: Clear to auscultation bilaterally, No wheezes, rhonchi, or crackles CARDIOVASCULAR: Normal heart rate and rhythm, S1 and S2 normal without murmurs ABDOMEN: Soft, non-tender, non-distended, without organomegaly, Normal bowel sounds EXTREMITIES: No cyanosis or edema NEUROLOGICAL: Cranial nerves grossly intact, Moves all extremities without gross motor or sensory deficit   Assessment & Plan:  Hypothyroidism, unspecified type -     CBC with Differential/Platelet -     CMP14+EGFR -     TSH + free T4  Anxiety -     ALPRAZolam ; Take 1 tablet (0.25 mg total) by mouth 2 (two) times daily.  Dispense: 60 tablet; Refill: 5 -     CBC with Differential/Platelet -     CMP14+EGFR  Controlled substance agreement signed -     ALPRAZolam ; Take 1  tablet (0.25 mg total) by mouth 2 (two) times daily.  Dispense: 60 tablet; Refill: 5 -     CBC with Differential/Platelet -     CMP14+EGFR  Stage 3b chronic kidney disease (HCC) -     CBC with Differential/Platelet -     CMP14+EGFR  Essential hypertension -     CBC with Differential/Platelet -     CMP14+EGFR  Primary osteoarthritis involving multiple joints -     CBC with Differential/Platelet -     CMP14+EGFR -     Sedimentation rate  Mixed hyperlipidemia -     Lipid panel  Other orders -     Rosuvastatin  Calcium ; Take 1 tablet (10 mg total) by mouth daily.  Dispense: 90 tablet; Refill: 3 -     Levothyroxine  Sodium; Take 1 tablet (88 mcg total) by mouth daily before breakfast.  Dispense: 90 tablet; Refill: 3 -     Fexofenadine  HCl; Take 1 tablet (60 mg total) by mouth 2 (two) times daily. For allergy and sinus  Dispense: 180 tablet; Refill: 3 -     Pregabalin; Take 1 capsule (75 mg total) by mouth at bedtime.  Dispense: 90 capsule; Refill: 1    Assessment and Plan Assessment & Plan Generalized osteoarthritis   She experiences chronic stiffness and pain in her back, shoulders, hips, and knees, with  limited mobility worsened by cold weather. Allergic reactions to NSAIDs and Zolpidem restrict treatment options. Pregabalin is prescribed as an alternative due to its renal safety profile, with one tablet at bedtime and a potential dose increase in 4-6 weeks based on response. A follow-up is scheduled in 4-6 weeks to assess pregabalin's effectiveness.  Chronic kidney disease, stage 3b   Stage 3b chronic kidney disease limits the use of NSAIDs for arthritis management due to nephrotoxicity risks. NSAIDs will continue to be avoided.  Hypothyroidism   Her energy levels are stable, but thyroid  function requires monitoring to ensure continued stability. Thyroid  function tests have been ordered.  Anxiety disorder   Anxiety is managed with alprazolam  (Xanax ), taken as needed, particularly in stressful situations.  Mixed hyperlipidemia   Previously managed with rosuvastatin , which she has discontinued. Cholesterol levels need reassessment, so a lipid panel is ordered. Potential resumption of rosuvastatin  will be discussed based on lipid panel results.       Follow-up: Return in about 6 months (around 12/01/2024) for [facility] physical.  Butler Der, M.D.

## 2024-06-04 LAB — CMP14+EGFR
ALT: 10 IU/L (ref 0–32)
AST: 21 IU/L (ref 0–40)
Albumin: 4.5 g/dL (ref 3.7–4.7)
Alkaline Phosphatase: 116 IU/L (ref 48–129)
BUN/Creatinine Ratio: 13 (ref 12–28)
BUN: 17 mg/dL (ref 8–27)
Bilirubin Total: 0.8 mg/dL (ref 0.0–1.2)
CO2: 19 mmol/L — ABNORMAL LOW (ref 20–29)
Calcium: 9.3 mg/dL (ref 8.7–10.3)
Chloride: 104 mmol/L (ref 96–106)
Creatinine, Ser: 1.32 mg/dL — ABNORMAL HIGH (ref 0.57–1.00)
Globulin, Total: 2.7 g/dL (ref 1.5–4.5)
Glucose: 89 mg/dL (ref 70–99)
Potassium: 4.3 mmol/L (ref 3.5–5.2)
Sodium: 141 mmol/L (ref 134–144)
Total Protein: 7.2 g/dL (ref 6.0–8.5)
eGFR: 40 mL/min/1.73 — ABNORMAL LOW (ref >59)

## 2024-06-04 LAB — CBC WITH DIFFERENTIAL/PLATELET
Basophils Absolute: 0 x10E3/uL (ref 0.0–0.2)
Basos: 1 %
EOS (ABSOLUTE): 0.2 x10E3/uL (ref 0.0–0.4)
Eos: 3 %
Hematocrit: 33.8 % — ABNORMAL LOW (ref 34.0–46.6)
Hemoglobin: 10.8 g/dL — ABNORMAL LOW (ref 11.1–15.9)
Immature Grans (Abs): 0 x10E3/uL (ref 0.0–0.1)
Immature Granulocytes: 0 %
Lymphocytes Absolute: 2.6 x10E3/uL (ref 0.7–3.1)
Lymphs: 43 %
MCH: 28.9 pg (ref 26.6–33.0)
MCHC: 32 g/dL (ref 31.5–35.7)
MCV: 90 fL (ref 79–97)
Monocytes Absolute: 0.5 x10E3/uL (ref 0.1–0.9)
Monocytes: 8 %
Neutrophils Absolute: 2.7 x10E3/uL (ref 1.4–7.0)
Neutrophils: 45 %
Platelets: 261 x10E3/uL (ref 150–450)
RBC: 3.74 x10E6/uL — ABNORMAL LOW (ref 3.77–5.28)
RDW: 13.1 % (ref 11.7–15.4)
WBC: 6 x10E3/uL (ref 3.4–10.8)

## 2024-06-04 LAB — LIPID PANEL
Chol/HDL Ratio: 2.8 ratio (ref 0.0–4.4)
Cholesterol, Total: 176 mg/dL (ref 100–199)
HDL: 62 mg/dL (ref >39)
LDL Chol Calc (NIH): 97 mg/dL (ref 0–99)
Triglycerides: 93 mg/dL (ref 0–149)
VLDL Cholesterol Cal: 17 mg/dL (ref 5–40)

## 2024-06-04 LAB — TSH+FREE T4
Free T4: 1.43 ng/dL (ref 0.82–1.77)
TSH: 2.92 u[IU]/mL (ref 0.450–4.500)

## 2024-06-04 LAB — SEDIMENTATION RATE: Sed Rate: 15 mm/h (ref 0–40)

## 2024-06-07 ENCOUNTER — Ambulatory Visit: Payer: Self-pay | Admitting: Family Medicine

## 2024-06-07 NOTE — Progress Notes (Signed)
Hello Aaliah,  Your lab result is normal and/or stable.Some minor variations that are not significant are commonly marked abnormal, but do not represent any medical problem for you.  Best regards, Quianna Avery, M.D.

## 2024-06-08 ENCOUNTER — Encounter: Payer: Self-pay | Admitting: Family Medicine

## 2024-06-09 ENCOUNTER — Encounter: Payer: Self-pay | Admitting: Obstetrics & Gynecology

## 2024-06-09 ENCOUNTER — Ambulatory Visit: Admitting: Obstetrics & Gynecology

## 2024-06-09 VITALS — BP 167/75 | HR 88 | Ht 62.0 in | Wt 128.0 lb

## 2024-06-09 DIAGNOSIS — N813 Complete uterovaginal prolapse: Secondary | ICD-10-CM

## 2024-06-09 DIAGNOSIS — Z4689 Encounter for fitting and adjustment of other specified devices: Secondary | ICD-10-CM | POA: Diagnosis not present

## 2024-06-09 NOTE — Progress Notes (Signed)
 Chief Complaint  Patient presents with   Pessary Check    Blood pressure (!) 167/75, pulse 88, height 5' 2 (1.575 m), weight 128 lb (58.1 kg).  Christie Bruce presents today for routine follow up related to her pessary.   She uses a Milex ring with support #2 She reports no vaginal discharge and no vaginal bleeding   Likert scale(1 not bothersome -5 very bothersome)  :  1  Exam reveals no undue vaginal mucosal pressure of breakdown, no discharge and no vaginal bleeding.  Vaginal Epithelial Abnormality Classification System:   0 0    No abnormalities 1    Epithelial erythema 2    Granulation tissue 3    Epithelial break or erosion, 1 cm or less 4    Epithelial break or erosion, 1 cm or greater  The pessary is removed, cleaned and replaced without difficulty.      ICD-10-CM   1. Pessary maintenance, Milex ring with support #2, OF 11/2012  Z46.89     2. Uterovaginal prolapse, complete  N81.3        Christie Bruce will be sen back in 4 months for continued follow up.  Vonn VEAR Inch, MD  06/09/2024 10:29 AM

## 2024-06-23 ENCOUNTER — Other Ambulatory Visit: Payer: Self-pay | Admitting: Family Medicine

## 2024-07-18 ENCOUNTER — Other Ambulatory Visit: Payer: Self-pay | Admitting: Family Medicine

## 2024-07-18 DIAGNOSIS — I10 Essential (primary) hypertension: Secondary | ICD-10-CM

## 2024-07-18 DIAGNOSIS — K219 Gastro-esophageal reflux disease without esophagitis: Secondary | ICD-10-CM

## 2024-11-25 ENCOUNTER — Ambulatory Visit

## 2024-11-26 ENCOUNTER — Ambulatory Visit: Payer: Self-pay

## 2024-12-02 ENCOUNTER — Other Ambulatory Visit

## 2024-12-02 ENCOUNTER — Encounter: Admitting: Family Medicine
